# Patient Record
Sex: Male | Born: 1965
Health system: Southern US, Community
[De-identification: ages and names within clinical notes are randomized; demographics above are authoritative.]

## PROBLEM LIST (undated history)

## (undated) DIAGNOSIS — F32A Depression, unspecified: Secondary | ICD-10-CM

## (undated) DIAGNOSIS — I10 Essential (primary) hypertension: Secondary | ICD-10-CM

## (undated) DIAGNOSIS — M069 Rheumatoid arthritis, unspecified: Secondary | ICD-10-CM

## (undated) DIAGNOSIS — F329 Major depressive disorder, single episode, unspecified: Secondary | ICD-10-CM

## (undated) DIAGNOSIS — E119 Type 2 diabetes mellitus without complications: Secondary | ICD-10-CM

## (undated) DIAGNOSIS — M359 Systemic involvement of connective tissue, unspecified: Secondary | ICD-10-CM

## (undated) HISTORY — PX: TONSILLECTOMY: SUR1361

## (undated) HISTORY — PX: CERVICAL SPINE SURGERY: SHX589

---

## 2002-11-03 ENCOUNTER — Encounter: Payer: Self-pay | Admitting: Neurosurgery

## 2002-11-03 ENCOUNTER — Inpatient Hospital Stay (HOSPITAL_COMMUNITY): Admission: RE | Admit: 2002-11-03 | Discharge: 2002-11-04 | Payer: Self-pay | Admitting: Neurosurgery

## 2003-02-27 ENCOUNTER — Ambulatory Visit (HOSPITAL_COMMUNITY): Admission: RE | Admit: 2003-02-27 | Discharge: 2003-02-27 | Payer: Self-pay | Admitting: Neurosurgery

## 2003-02-27 ENCOUNTER — Encounter: Payer: Self-pay | Admitting: Neurosurgery

## 2006-02-25 ENCOUNTER — Emergency Department (HOSPITAL_COMMUNITY): Admission: EM | Admit: 2006-02-25 | Discharge: 2006-02-25 | Payer: Self-pay | Admitting: Emergency Medicine

## 2007-06-05 ENCOUNTER — Emergency Department (HOSPITAL_COMMUNITY): Admission: EM | Admit: 2007-06-05 | Discharge: 2007-06-05 | Payer: Self-pay | Admitting: Emergency Medicine

## 2010-12-13 NOTE — Op Note (Signed)
NAME:  Darrell Terry, Darrell Terry                        ACCOUNT NO.:  [id number]   MEDICAL RECORD NO.:  [id number]                   PATIENT TYPE:  INP   LOCATION:  3007                                 FACILITY:  [facility]   PHYSICIAN:  Hewitt Shorts, M.D.            DATE OF BIRTH:  1966/02/18   DATE OF PROCEDURE:  11/03/2002  DATE OF DISCHARGE:  11/04/2002                                 OPERATIVE REPORT   PREOPERATIVE DIAGNOSES:  C5-6 cervical disk herniation, spondylosis,  degenerative disk disease with stenosis.   POSTOPERATIVE DIAGNOSES:  C5-6 cervical disk herniation, spondylosis,  degenerative disk disease with stenosis.   PROCEDURE:  C5-6 anterior cervical diskectomy and arthrodesis with allograft  and cervical plating.   SURGEON:  Hewitt Shorts, M.D.   ASSISTANT:  Cristi Loron, M.D.   ANESTHESIA:  General endotracheal.   INDICATION FOR PROCEDURE:  The patient is a 45 year old man who was found to  have disk herniation, cervical spondylosis and degenerative disk disease  with relative stenosis.  A decision was made to proceed with decompression  and arthrodesis.   DESCRIPTION OF PROCEDURE:  The patient was brought to the operating room and  placed under general endotracheal anesthesia.  The patient was placed in 10  pounds of Holter traction.  The neck was prepped with Betadine soap and  solution and draped in a sterile fashion.  A horizontal incision was made in  the left side of the neck.  The line of the incision was infiltrated with  local anesthetic with epinephrine.  An incision was made, carried down  through the subcutaneous tissue.  Dissection was carried down further  through the platysma and then dissection was carried down through an  avascular plane leaving the sternocleidomastoid, carotid artery, and jugular  vein laterally and the trachea and esophagus medially.  The ventral aspect  of the vertebral column was identified and a localizing x-ray was  taken and  the C5-6 intervertebral disk space identified.  Diskectomy was begun with  incision in the annulus and continued with microcurettes and pituitary  rongeurs.  The cartilaginous end plates of the corresponding vertebrae were  removed using microcurettes along with the Micro-Max drill.  The microscope  was draped and brought into the field to provide additional magnification,  illumination, and visualization, and the remainder of the diskectomy was  performed using microdissection and microsurgical technique.  The posterior  osteophytic overgrowth was removed using the Micro-Max drill to thin the  osteophytic overgrowth and then a 2 mm Kerrison punch with a thin foot plate  to remove the last bit of bone and thickened ligament tissue.  Decompression  of the spinal canal and the thecal sac as well as the foramina and nerve  roots was performed bilaterally and once the diskectomy was completed and  the decompression completed, we proceeded with an arthrodesis.  We selected  a wedge of iliac crest allograft that  was placed in the intervertebral disk  space and counter sunk and then we selected a 24 mm Trinica cervical plate.  It was positioned over the fusion construct and secured to each of the  vertebrae with a pair of 4.2 x 14 mm screws.  Each screw hole was drilled,  tapped, and the screw placed in alternating fashion.  Once all four screws  were fully tightened, the locking system was secured.  An x-ray was taken,  which showed the graft in good position, the plate and screws in good  position, the alignment was good.  The wound was irrigated with bacitracin  solution, checked for hemostasis, which was established and confirmed, and  the wound was closed in multiple layers.  The platysma was closed with  interrupted, inverted 2-0 undyed Vicryl sutures, the subcutaneous and  subcuticular layer were closed with interrupted, inverted 3-0 undyed Vicryl  sutures, the skin edge  approximated with Dermabond.  The patient tolerated  the procedure well.  The estimated blood loss was 25 mL.  Sponge and needle  count were correct.  Following surgery, the patient was taken out of  traction and was placed in a soft collar and transferred to the recovery  room for further care.                                               Hewitt Shorts, M.D.    RWN/MEDQ  D:  11/30/2002  T:  11/30/2002  Job:  [id number]

## 2011-06-05 ENCOUNTER — Other Ambulatory Visit: Payer: Self-pay | Admitting: Neurology

## 2011-06-05 DIAGNOSIS — M542 Cervicalgia: Secondary | ICD-10-CM

## 2011-06-09 ENCOUNTER — Ambulatory Visit (HOSPITAL_COMMUNITY)
Admission: RE | Admit: 2011-06-09 | Discharge: 2011-06-09 | Disposition: A | Discharge status: Home / Self Care | Payer: Medicaid Other | Source: Ambulatory Visit | Attending: Neurology | Admitting: Neurology

## 2011-06-09 DIAGNOSIS — Z981 Arthrodesis status: Secondary | ICD-10-CM | POA: Insufficient documentation

## 2011-06-09 DIAGNOSIS — M79609 Pain in unspecified limb: Secondary | ICD-10-CM | POA: Insufficient documentation

## 2011-06-09 DIAGNOSIS — M542 Cervicalgia: Secondary | ICD-10-CM | POA: Insufficient documentation

## 2011-06-09 DIAGNOSIS — M5020 Other cervical disc displacement, unspecified cervical region: Secondary | ICD-10-CM | POA: Insufficient documentation

## 2011-08-14 DIAGNOSIS — M069 Rheumatoid arthritis, unspecified: Secondary | ICD-10-CM | POA: Diagnosis not present

## 2011-08-14 DIAGNOSIS — I10 Essential (primary) hypertension: Secondary | ICD-10-CM | POA: Diagnosis not present

## 2011-09-16 DIAGNOSIS — M069 Rheumatoid arthritis, unspecified: Secondary | ICD-10-CM | POA: Diagnosis not present

## 2011-11-19 DIAGNOSIS — M069 Rheumatoid arthritis, unspecified: Secondary | ICD-10-CM | POA: Diagnosis not present

## 2011-11-27 DIAGNOSIS — M069 Rheumatoid arthritis, unspecified: Secondary | ICD-10-CM | POA: Diagnosis not present

## 2011-11-27 DIAGNOSIS — M542 Cervicalgia: Secondary | ICD-10-CM | POA: Diagnosis not present

## 2011-11-27 DIAGNOSIS — M5380 Other specified dorsopathies, site unspecified: Secondary | ICD-10-CM | POA: Diagnosis not present

## 2011-11-27 DIAGNOSIS — Z981 Arthrodesis status: Secondary | ICD-10-CM | POA: Diagnosis not present

## 2011-12-01 DIAGNOSIS — L723 Sebaceous cyst: Secondary | ICD-10-CM | POA: Diagnosis not present

## 2011-12-01 DIAGNOSIS — B079 Viral wart, unspecified: Secondary | ICD-10-CM | POA: Diagnosis not present

## 2011-12-15 DIAGNOSIS — M47812 Spondylosis without myelopathy or radiculopathy, cervical region: Secondary | ICD-10-CM | POA: Diagnosis not present

## 2011-12-15 DIAGNOSIS — M62838 Other muscle spasm: Secondary | ICD-10-CM | POA: Diagnosis not present

## 2012-01-07 DIAGNOSIS — Z01811 Encounter for preprocedural respiratory examination: Secondary | ICD-10-CM | POA: Diagnosis not present

## 2012-01-07 DIAGNOSIS — Z01818 Encounter for other preprocedural examination: Secondary | ICD-10-CM | POA: Diagnosis not present

## 2012-01-07 DIAGNOSIS — M069 Rheumatoid arthritis, unspecified: Secondary | ICD-10-CM | POA: Diagnosis not present

## 2012-01-07 DIAGNOSIS — Z01810 Encounter for preprocedural cardiovascular examination: Secondary | ICD-10-CM | POA: Diagnosis not present

## 2012-01-14 DIAGNOSIS — F411 Generalized anxiety disorder: Secondary | ICD-10-CM | POA: Diagnosis not present

## 2012-01-14 DIAGNOSIS — Z472 Encounter for removal of internal fixation device: Secondary | ICD-10-CM | POA: Diagnosis not present

## 2012-01-14 DIAGNOSIS — Z981 Arthrodesis status: Secondary | ICD-10-CM | POA: Diagnosis not present

## 2012-01-14 DIAGNOSIS — M542 Cervicalgia: Secondary | ICD-10-CM | POA: Diagnosis not present

## 2012-01-14 DIAGNOSIS — M5030 Other cervical disc degeneration, unspecified cervical region: Secondary | ICD-10-CM | POA: Diagnosis not present

## 2012-01-14 DIAGNOSIS — R51 Headache: Secondary | ICD-10-CM | POA: Diagnosis not present

## 2012-01-14 DIAGNOSIS — M4712 Other spondylosis with myelopathy, cervical region: Secondary | ICD-10-CM | POA: Diagnosis not present

## 2012-01-14 DIAGNOSIS — M069 Rheumatoid arthritis, unspecified: Secondary | ICD-10-CM | POA: Diagnosis not present

## 2012-02-03 DIAGNOSIS — Z4789 Encounter for other orthopedic aftercare: Secondary | ICD-10-CM | POA: Diagnosis not present

## 2012-02-03 DIAGNOSIS — Z981 Arthrodesis status: Secondary | ICD-10-CM | POA: Diagnosis not present

## 2012-02-11 DIAGNOSIS — M5090 Cervical disc disorder, unspecified, unspecified cervical region: Secondary | ICD-10-CM | POA: Diagnosis not present

## 2012-02-11 DIAGNOSIS — K219 Gastro-esophageal reflux disease without esophagitis: Secondary | ICD-10-CM | POA: Diagnosis not present

## 2012-02-18 DIAGNOSIS — M069 Rheumatoid arthritis, unspecified: Secondary | ICD-10-CM | POA: Diagnosis not present

## 2012-02-18 DIAGNOSIS — Z981 Arthrodesis status: Secondary | ICD-10-CM | POA: Diagnosis not present

## 2012-02-23 DIAGNOSIS — Z981 Arthrodesis status: Secondary | ICD-10-CM | POA: Diagnosis not present

## 2012-04-20 DIAGNOSIS — Z981 Arthrodesis status: Secondary | ICD-10-CM | POA: Diagnosis not present

## 2012-04-20 DIAGNOSIS — Z4789 Encounter for other orthopedic aftercare: Secondary | ICD-10-CM | POA: Diagnosis not present

## 2012-05-12 DIAGNOSIS — Z23 Encounter for immunization: Secondary | ICD-10-CM | POA: Diagnosis not present

## 2012-05-12 DIAGNOSIS — M5090 Cervical disc disorder, unspecified, unspecified cervical region: Secondary | ICD-10-CM | POA: Diagnosis not present

## 2012-05-12 DIAGNOSIS — G542 Cervical root disorders, not elsewhere classified: Secondary | ICD-10-CM | POA: Diagnosis not present

## 2012-05-12 DIAGNOSIS — R5383 Other fatigue: Secondary | ICD-10-CM | POA: Diagnosis not present

## 2012-05-12 DIAGNOSIS — I10 Essential (primary) hypertension: Secondary | ICD-10-CM | POA: Diagnosis not present

## 2012-05-12 DIAGNOSIS — M069 Rheumatoid arthritis, unspecified: Secondary | ICD-10-CM | POA: Diagnosis not present

## 2012-05-12 DIAGNOSIS — R5381 Other malaise: Secondary | ICD-10-CM | POA: Diagnosis not present

## 2012-07-26 DIAGNOSIS — Z09 Encounter for follow-up examination after completed treatment for conditions other than malignant neoplasm: Secondary | ICD-10-CM | POA: Diagnosis not present

## 2012-07-26 DIAGNOSIS — M5380 Other specified dorsopathies, site unspecified: Secondary | ICD-10-CM | POA: Diagnosis not present

## 2012-07-26 DIAGNOSIS — Z981 Arthrodesis status: Secondary | ICD-10-CM | POA: Diagnosis not present

## 2012-07-26 DIAGNOSIS — M542 Cervicalgia: Secondary | ICD-10-CM | POA: Diagnosis not present

## 2012-08-09 DIAGNOSIS — M47812 Spondylosis without myelopathy or radiculopathy, cervical region: Secondary | ICD-10-CM | POA: Insufficient documentation

## 2012-08-11 DIAGNOSIS — M069 Rheumatoid arthritis, unspecified: Secondary | ICD-10-CM | POA: Diagnosis not present

## 2012-08-11 DIAGNOSIS — R109 Unspecified abdominal pain: Secondary | ICD-10-CM | POA: Diagnosis not present

## 2012-08-16 DIAGNOSIS — K7689 Other specified diseases of liver: Secondary | ICD-10-CM | POA: Diagnosis not present

## 2012-08-16 DIAGNOSIS — D3000 Benign neoplasm of unspecified kidney: Secondary | ICD-10-CM | POA: Diagnosis not present

## 2012-08-16 DIAGNOSIS — R109 Unspecified abdominal pain: Secondary | ICD-10-CM | POA: Diagnosis not present

## 2012-08-16 DIAGNOSIS — K824 Cholesterolosis of gallbladder: Secondary | ICD-10-CM | POA: Diagnosis not present

## 2012-08-19 DIAGNOSIS — M069 Rheumatoid arthritis, unspecified: Secondary | ICD-10-CM | POA: Diagnosis not present

## 2012-08-19 DIAGNOSIS — M5090 Cervical disc disorder, unspecified, unspecified cervical region: Secondary | ICD-10-CM | POA: Diagnosis not present

## 2012-09-01 DIAGNOSIS — M069 Rheumatoid arthritis, unspecified: Secondary | ICD-10-CM | POA: Diagnosis not present

## 2012-09-01 DIAGNOSIS — Z79899 Other long term (current) drug therapy: Secondary | ICD-10-CM | POA: Diagnosis not present

## 2012-09-10 DIAGNOSIS — M069 Rheumatoid arthritis, unspecified: Secondary | ICD-10-CM | POA: Diagnosis not present

## 2012-09-10 DIAGNOSIS — E291 Testicular hypofunction: Secondary | ICD-10-CM | POA: Diagnosis not present

## 2012-09-10 DIAGNOSIS — R109 Unspecified abdominal pain: Secondary | ICD-10-CM | POA: Diagnosis not present

## 2012-09-10 DIAGNOSIS — N289 Disorder of kidney and ureter, unspecified: Secondary | ICD-10-CM | POA: Diagnosis not present

## 2012-10-04 DIAGNOSIS — E291 Testicular hypofunction: Secondary | ICD-10-CM | POA: Diagnosis not present

## 2012-12-08 DIAGNOSIS — Z79899 Other long term (current) drug therapy: Secondary | ICD-10-CM | POA: Diagnosis not present

## 2012-12-08 DIAGNOSIS — M47812 Spondylosis without myelopathy or radiculopathy, cervical region: Secondary | ICD-10-CM | POA: Diagnosis not present

## 2012-12-08 DIAGNOSIS — M069 Rheumatoid arthritis, unspecified: Secondary | ICD-10-CM | POA: Diagnosis not present

## 2012-12-08 DIAGNOSIS — M1990 Unspecified osteoarthritis, unspecified site: Secondary | ICD-10-CM | POA: Diagnosis not present

## 2013-02-15 ENCOUNTER — Other Ambulatory Visit (HOSPITAL_COMMUNITY): Payer: Self-pay | Admitting: Internal Medicine

## 2013-02-15 ENCOUNTER — Ambulatory Visit (HOSPITAL_COMMUNITY)
Admission: RE | Admit: 2013-02-15 | Discharge: 2013-02-15 | Disposition: A | Discharge status: Home / Self Care | Payer: Medicare Other | Source: Ambulatory Visit | Attending: Internal Medicine | Admitting: Internal Medicine

## 2013-02-15 DIAGNOSIS — R7309 Other abnormal glucose: Secondary | ICD-10-CM | POA: Diagnosis not present

## 2013-02-15 DIAGNOSIS — M5090 Cervical disc disorder, unspecified, unspecified cervical region: Secondary | ICD-10-CM | POA: Diagnosis not present

## 2013-02-15 DIAGNOSIS — J40 Bronchitis, not specified as acute or chronic: Secondary | ICD-10-CM

## 2013-02-15 DIAGNOSIS — M069 Rheumatoid arthritis, unspecified: Secondary | ICD-10-CM | POA: Diagnosis not present

## 2013-02-15 DIAGNOSIS — E782 Mixed hyperlipidemia: Secondary | ICD-10-CM | POA: Diagnosis not present

## 2013-02-15 DIAGNOSIS — Z0000 Encounter for general adult medical examination without abnormal findings: Secondary | ICD-10-CM | POA: Diagnosis not present

## 2013-02-15 DIAGNOSIS — M542 Cervicalgia: Secondary | ICD-10-CM | POA: Diagnosis not present

## 2013-02-15 DIAGNOSIS — R51 Headache: Secondary | ICD-10-CM | POA: Diagnosis not present

## 2013-02-15 DIAGNOSIS — R799 Abnormal finding of blood chemistry, unspecified: Secondary | ICD-10-CM | POA: Diagnosis not present

## 2013-02-15 DIAGNOSIS — Z981 Arthrodesis status: Secondary | ICD-10-CM | POA: Diagnosis not present

## 2013-02-15 DIAGNOSIS — I10 Essential (primary) hypertension: Secondary | ICD-10-CM | POA: Diagnosis not present

## 2013-03-03 DIAGNOSIS — M531 Cervicobrachial syndrome: Secondary | ICD-10-CM | POA: Diagnosis not present

## 2013-03-03 DIAGNOSIS — G500 Trigeminal neuralgia: Secondary | ICD-10-CM | POA: Diagnosis not present

## 2013-03-03 DIAGNOSIS — M47812 Spondylosis without myelopathy or radiculopathy, cervical region: Secondary | ICD-10-CM | POA: Diagnosis not present

## 2013-03-03 DIAGNOSIS — Z981 Arthrodesis status: Secondary | ICD-10-CM | POA: Diagnosis not present

## 2013-03-03 DIAGNOSIS — M5481 Occipital neuralgia: Secondary | ICD-10-CM | POA: Insufficient documentation

## 2013-03-03 DIAGNOSIS — R51 Headache: Secondary | ICD-10-CM | POA: Diagnosis not present

## 2013-03-03 DIAGNOSIS — M069 Rheumatoid arthritis, unspecified: Secondary | ICD-10-CM | POA: Diagnosis not present

## 2013-03-14 ENCOUNTER — Other Ambulatory Visit (HOSPITAL_COMMUNITY): Payer: Self-pay | Admitting: *Deleted

## 2013-03-14 DIAGNOSIS — G500 Trigeminal neuralgia: Secondary | ICD-10-CM

## 2013-03-16 ENCOUNTER — Ambulatory Visit (HOSPITAL_COMMUNITY)
Admission: RE | Admit: 2013-03-16 | Discharge: 2013-03-16 | Disposition: A | Discharge status: Home / Self Care | Payer: Medicare Other | Source: Ambulatory Visit | Attending: Family Medicine | Admitting: Family Medicine

## 2013-03-16 DIAGNOSIS — R209 Unspecified disturbances of skin sensation: Secondary | ICD-10-CM | POA: Diagnosis not present

## 2013-03-16 DIAGNOSIS — G500 Trigeminal neuralgia: Secondary | ICD-10-CM | POA: Diagnosis not present

## 2013-03-16 DIAGNOSIS — R51 Headache: Secondary | ICD-10-CM | POA: Diagnosis not present

## 2013-03-16 MED ORDER — GADOBENATE DIMEGLUMINE 529 MG/ML IV SOLN
20.0000 mL | Freq: Once | INTRAVENOUS | Status: AC | PRN
  Administered 2013-03-16: 20 mL via INTRAVENOUS

## 2013-03-23 DIAGNOSIS — Z79899 Other long term (current) drug therapy: Secondary | ICD-10-CM | POA: Diagnosis not present

## 2013-03-23 DIAGNOSIS — M069 Rheumatoid arthritis, unspecified: Secondary | ICD-10-CM | POA: Diagnosis not present

## 2013-04-14 DIAGNOSIS — R51 Headache: Secondary | ICD-10-CM | POA: Diagnosis not present

## 2013-04-14 DIAGNOSIS — R209 Unspecified disturbances of skin sensation: Secondary | ICD-10-CM | POA: Diagnosis not present

## 2013-04-14 DIAGNOSIS — M531 Cervicobrachial syndrome: Secondary | ICD-10-CM | POA: Diagnosis not present

## 2013-05-11 DIAGNOSIS — M069 Rheumatoid arthritis, unspecified: Secondary | ICD-10-CM | POA: Diagnosis not present

## 2013-05-11 DIAGNOSIS — Z79899 Other long term (current) drug therapy: Secondary | ICD-10-CM | POA: Diagnosis not present

## 2013-05-17 DIAGNOSIS — E7800 Pure hypercholesterolemia, unspecified: Secondary | ICD-10-CM | POA: Diagnosis not present

## 2013-05-17 DIAGNOSIS — K219 Gastro-esophageal reflux disease without esophagitis: Secondary | ICD-10-CM | POA: Diagnosis not present

## 2013-05-17 DIAGNOSIS — M069 Rheumatoid arthritis, unspecified: Secondary | ICD-10-CM | POA: Diagnosis not present

## 2013-05-17 DIAGNOSIS — Z23 Encounter for immunization: Secondary | ICD-10-CM | POA: Diagnosis not present

## 2013-06-20 DIAGNOSIS — N289 Disorder of kidney and ureter, unspecified: Secondary | ICD-10-CM | POA: Diagnosis not present

## 2013-06-20 DIAGNOSIS — E291 Testicular hypofunction: Secondary | ICD-10-CM | POA: Diagnosis not present

## 2013-06-20 DIAGNOSIS — N529 Male erectile dysfunction, unspecified: Secondary | ICD-10-CM | POA: Diagnosis not present

## 2013-06-21 DIAGNOSIS — N529 Male erectile dysfunction, unspecified: Secondary | ICD-10-CM | POA: Insufficient documentation

## 2013-08-17 DIAGNOSIS — M069 Rheumatoid arthritis, unspecified: Secondary | ICD-10-CM | POA: Diagnosis not present

## 2013-08-17 DIAGNOSIS — M47812 Spondylosis without myelopathy or radiculopathy, cervical region: Secondary | ICD-10-CM | POA: Diagnosis not present

## 2013-11-23 DIAGNOSIS — Z79899 Other long term (current) drug therapy: Secondary | ICD-10-CM | POA: Diagnosis not present

## 2013-11-23 DIAGNOSIS — M069 Rheumatoid arthritis, unspecified: Secondary | ICD-10-CM | POA: Diagnosis not present

## 2014-03-16 DIAGNOSIS — M069 Rheumatoid arthritis, unspecified: Secondary | ICD-10-CM | POA: Diagnosis not present

## 2014-03-16 DIAGNOSIS — Z79899 Other long term (current) drug therapy: Secondary | ICD-10-CM | POA: Diagnosis not present

## 2014-06-08 DIAGNOSIS — R51 Headache: Secondary | ICD-10-CM | POA: Diagnosis not present

## 2014-06-08 DIAGNOSIS — M79601 Pain in right arm: Secondary | ICD-10-CM | POA: Diagnosis not present

## 2014-06-08 DIAGNOSIS — M542 Cervicalgia: Secondary | ICD-10-CM | POA: Diagnosis not present

## 2014-06-08 DIAGNOSIS — F17210 Nicotine dependence, cigarettes, uncomplicated: Secondary | ICD-10-CM | POA: Diagnosis not present

## 2014-06-08 DIAGNOSIS — R202 Paresthesia of skin: Secondary | ICD-10-CM | POA: Diagnosis not present

## 2014-06-08 DIAGNOSIS — M47812 Spondylosis without myelopathy or radiculopathy, cervical region: Secondary | ICD-10-CM | POA: Diagnosis not present

## 2014-06-08 DIAGNOSIS — Z981 Arthrodesis status: Secondary | ICD-10-CM | POA: Diagnosis not present

## 2014-06-16 DIAGNOSIS — N2889 Other specified disorders of kidney and ureter: Secondary | ICD-10-CM | POA: Diagnosis not present

## 2014-07-12 DIAGNOSIS — Z79899 Other long term (current) drug therapy: Secondary | ICD-10-CM | POA: Diagnosis not present

## 2014-07-12 DIAGNOSIS — M0589 Other rheumatoid arthritis with rheumatoid factor of multiple sites: Secondary | ICD-10-CM | POA: Diagnosis not present

## 2014-07-12 DIAGNOSIS — Z981 Arthrodesis status: Secondary | ICD-10-CM | POA: Diagnosis not present

## 2014-07-12 DIAGNOSIS — Z13820 Encounter for screening for osteoporosis: Secondary | ICD-10-CM | POA: Diagnosis not present

## 2014-07-12 DIAGNOSIS — Z5181 Encounter for therapeutic drug level monitoring: Secondary | ICD-10-CM | POA: Diagnosis not present

## 2014-07-12 DIAGNOSIS — F17200 Nicotine dependence, unspecified, uncomplicated: Secondary | ICD-10-CM | POA: Diagnosis not present

## 2014-07-12 DIAGNOSIS — R918 Other nonspecific abnormal finding of lung field: Secondary | ICD-10-CM | POA: Diagnosis not present

## 2014-07-12 DIAGNOSIS — Z23 Encounter for immunization: Secondary | ICD-10-CM | POA: Diagnosis not present

## 2014-07-12 DIAGNOSIS — M0689 Other specified rheumatoid arthritis, multiple sites: Secondary | ICD-10-CM | POA: Diagnosis not present

## 2014-10-17 DIAGNOSIS — R030 Elevated blood-pressure reading, without diagnosis of hypertension: Secondary | ICD-10-CM | POA: Diagnosis not present

## 2014-10-17 DIAGNOSIS — F17210 Nicotine dependence, cigarettes, uncomplicated: Secondary | ICD-10-CM | POA: Diagnosis not present

## 2014-10-17 DIAGNOSIS — M069 Rheumatoid arthritis, unspecified: Secondary | ICD-10-CM | POA: Diagnosis not present

## 2014-10-17 DIAGNOSIS — Z79899 Other long term (current) drug therapy: Secondary | ICD-10-CM | POA: Diagnosis not present

## 2014-10-17 DIAGNOSIS — M65842 Other synovitis and tenosynovitis, left hand: Secondary | ICD-10-CM | POA: Diagnosis not present

## 2014-10-17 DIAGNOSIS — M0589 Other rheumatoid arthritis with rheumatoid factor of multiple sites: Secondary | ICD-10-CM | POA: Diagnosis not present

## 2014-10-17 DIAGNOSIS — Z7982 Long term (current) use of aspirin: Secondary | ICD-10-CM | POA: Diagnosis not present

## 2014-10-17 DIAGNOSIS — M65841 Other synovitis and tenosynovitis, right hand: Secondary | ICD-10-CM | POA: Diagnosis not present

## 2014-10-17 DIAGNOSIS — Z5181 Encounter for therapeutic drug level monitoring: Secondary | ICD-10-CM | POA: Diagnosis not present

## 2014-10-17 DIAGNOSIS — M0579 Rheumatoid arthritis with rheumatoid factor of multiple sites without organ or systems involvement: Secondary | ICD-10-CM | POA: Insufficient documentation

## 2014-10-17 DIAGNOSIS — Z7952 Long term (current) use of systemic steroids: Secondary | ICD-10-CM | POA: Diagnosis not present

## 2015-01-17 DIAGNOSIS — F17210 Nicotine dependence, cigarettes, uncomplicated: Secondary | ICD-10-CM | POA: Diagnosis not present

## 2015-01-17 DIAGNOSIS — M0589 Other rheumatoid arthritis with rheumatoid factor of multiple sites: Secondary | ICD-10-CM | POA: Diagnosis not present

## 2015-01-17 DIAGNOSIS — Z79899 Other long term (current) drug therapy: Secondary | ICD-10-CM | POA: Diagnosis not present

## 2015-01-17 DIAGNOSIS — E559 Vitamin D deficiency, unspecified: Secondary | ICD-10-CM | POA: Diagnosis not present

## 2015-02-15 DIAGNOSIS — M5412 Radiculopathy, cervical region: Secondary | ICD-10-CM | POA: Diagnosis not present

## 2015-02-15 DIAGNOSIS — M5030 Other cervical disc degeneration, unspecified cervical region: Secondary | ICD-10-CM | POA: Diagnosis not present

## 2015-02-15 DIAGNOSIS — F17200 Nicotine dependence, unspecified, uncomplicated: Secondary | ICD-10-CM | POA: Diagnosis not present

## 2015-02-15 DIAGNOSIS — M069 Rheumatoid arthritis, unspecified: Secondary | ICD-10-CM | POA: Diagnosis not present

## 2015-02-15 DIAGNOSIS — R7309 Other abnormal glucose: Secondary | ICD-10-CM | POA: Diagnosis not present

## 2015-02-15 DIAGNOSIS — I10 Essential (primary) hypertension: Secondary | ICD-10-CM | POA: Diagnosis not present

## 2015-02-15 DIAGNOSIS — E785 Hyperlipidemia, unspecified: Secondary | ICD-10-CM | POA: Diagnosis not present

## 2015-02-15 DIAGNOSIS — E780 Pure hypercholesterolemia: Secondary | ICD-10-CM | POA: Diagnosis not present

## 2015-02-15 DIAGNOSIS — R945 Abnormal results of liver function studies: Secondary | ICD-10-CM | POA: Diagnosis not present

## 2015-03-06 ENCOUNTER — Telehealth: Payer: Self-pay

## 2015-03-14 DIAGNOSIS — F17200 Nicotine dependence, unspecified, uncomplicated: Secondary | ICD-10-CM | POA: Diagnosis not present

## 2015-03-14 DIAGNOSIS — M069 Rheumatoid arthritis, unspecified: Secondary | ICD-10-CM | POA: Diagnosis not present

## 2015-03-14 DIAGNOSIS — E119 Type 2 diabetes mellitus without complications: Secondary | ICD-10-CM | POA: Diagnosis not present

## 2015-03-14 DIAGNOSIS — F17290 Nicotine dependence, other tobacco product, uncomplicated: Secondary | ICD-10-CM | POA: Diagnosis not present

## 2015-03-14 DIAGNOSIS — E1165 Type 2 diabetes mellitus with hyperglycemia: Secondary | ICD-10-CM | POA: Diagnosis not present

## 2015-03-14 DIAGNOSIS — M5412 Radiculopathy, cervical region: Secondary | ICD-10-CM | POA: Diagnosis not present

## 2015-03-16 ENCOUNTER — Other Ambulatory Visit: Payer: Self-pay

## 2015-03-16 DIAGNOSIS — Z1211 Encounter for screening for malignant neoplasm of colon: Secondary | ICD-10-CM

## 2015-03-16 NOTE — Telephone Encounter (Signed)
Gastroenterology Pre-Procedure Review  Request Date: 02/27/2015 Requesting Physician:   PATIENT REVIEW QUESTIONS: The patient responded to the following health history questions as indicated:    1. Diabetes Melitis: no 2. Joint replacements in the past 12 months: no 3. Major health problems in the past 3 months: no 4. Has an artificial valve or MVP: no 5. Has a defibrillator: no 6. Has been advised in past to take antibiotics in advance of a procedure like teeth cleaning: no    MEDICATIONS & ALLERGIES:    Patient reports the following regarding taking any blood thinners:   Plavix? no Aspirin? no Coumadin? no  Patient confirms/reports the following medications:  Current Outpatient Prescriptions  Medication Sig Dispense Refill  . folic acid (FOLVITE) 124 MCG tablet Take 400 mcg by mouth 2 (two) times daily.    . methotrexate 2.5 MG tablet Take 2.5 mg by mouth once a week. Pt takes 6 tablets once a week    . Omega-3 Fatty Acids (FISH OIL) 1000 MG CAPS Take 1,000 mg by mouth daily.    Marland Kitchen omeprazole (PRILOSEC) 20 MG capsule Take 20 mg by mouth daily.     No current facility-administered medications for this visit.    Patient confirms/reports the following allergies:  No Known Allergies  No orders of the defined types were placed in this encounter.    AUTHORIZATION INFORMATION Primary Insurance:   ID #:  Group #:  Pre-Cert / Auth required:  Pre-Cert / Auth #:   Secondary Insurance:   ID #:  Group #:  Pre-Cert / Auth required: Pre-Cert / Auth #:   SCHEDULE INFORMATION: Procedure has been scheduled as follows:  Date:  03/26/2015                 Time:  7:30 Am Location: [facility] Short Stay  This Gastroenterology Pre-Precedure Review Form is being routed to the following provider(s): R. Garfield Cornea, MD

## 2015-03-16 NOTE — Telephone Encounter (Signed)
Appropriate.

## 2015-03-20 MED ORDER — PEG 3350-KCL-NA BICARB-NACL 420 G PO SOLR: 4000.0000 mL | ORAL | Status: DC

## 2015-03-20 NOTE — Telephone Encounter (Signed)
LMOM to call in reference to the time change of the procedure. Rx sent to [facility] and instructions faxed to the pharmacy also.

## 2015-03-21 NOTE — Telephone Encounter (Signed)
Pt is aware and will check today and let me know if there are any problems since he is scheduled for Mon.

## 2015-03-26 ENCOUNTER — Encounter (HOSPITAL_COMMUNITY): Payer: Self-pay | Admitting: *Deleted

## 2015-03-26 ENCOUNTER — Encounter (HOSPITAL_COMMUNITY)
Admission: RE | Disposition: A | Discharge status: Home / Self Care | Payer: Self-pay | Source: Ambulatory Visit | Attending: Internal Medicine

## 2015-03-26 ENCOUNTER — Ambulatory Visit (HOSPITAL_COMMUNITY)
Admission: RE | Admit: 2015-03-26 | Discharge: 2015-03-26 | Disposition: A | Discharge status: Home / Self Care | Payer: Medicare Other | Source: Ambulatory Visit | Attending: Internal Medicine | Admitting: Internal Medicine

## 2015-03-26 DIAGNOSIS — Z79899 Other long term (current) drug therapy: Secondary | ICD-10-CM | POA: Diagnosis not present

## 2015-03-26 DIAGNOSIS — D129 Benign neoplasm of anus and anal canal: Secondary | ICD-10-CM | POA: Diagnosis not present

## 2015-03-26 DIAGNOSIS — E119 Type 2 diabetes mellitus without complications: Secondary | ICD-10-CM | POA: Insufficient documentation

## 2015-03-26 DIAGNOSIS — M069 Rheumatoid arthritis, unspecified: Secondary | ICD-10-CM | POA: Insufficient documentation

## 2015-03-26 DIAGNOSIS — Z1211 Encounter for screening for malignant neoplasm of colon: Secondary | ICD-10-CM | POA: Diagnosis not present

## 2015-03-26 DIAGNOSIS — K644 Residual hemorrhoidal skin tags: Secondary | ICD-10-CM | POA: Diagnosis not present

## 2015-03-26 DIAGNOSIS — K621 Rectal polyp: Secondary | ICD-10-CM | POA: Insufficient documentation

## 2015-03-26 HISTORY — DX: Depression, unspecified: F32.A

## 2015-03-26 HISTORY — PX: COLONOSCOPY: SHX5424

## 2015-03-26 HISTORY — DX: Rheumatoid arthritis, unspecified: M06.9

## 2015-03-26 HISTORY — DX: Type 2 diabetes mellitus without complications: E11.9

## 2015-03-26 HISTORY — DX: Major depressive disorder, single episode, unspecified: F32.9

## 2015-03-26 LAB — GLUCOSE, CAPILLARY: GLUCOSE-CAPILLARY: 159 mg/dL — AB (ref 65–99)

## 2015-03-26 SURGERY — COLONOSCOPY
Anesthesia: Moderate Sedation

## 2015-03-26 MED ORDER — SODIUM CHLORIDE 0.9 % IV SOLN
INTRAVENOUS | Status: DC
  Administered 2015-03-26: 08:00:00 via INTRAVENOUS

## 2015-03-26 MED ORDER — STERILE WATER FOR IRRIGATION IR SOLN
Status: DC | PRN
  Administered 2015-03-26: 08:00:00

## 2015-03-26 MED ORDER — MIDAZOLAM HCL 5 MG/5ML IJ SOLN
INTRAMUSCULAR | Status: DC | PRN
  Administered 2015-03-26: 1 mg via INTRAVENOUS
  Administered 2015-03-26 (×2): 2 mg via INTRAVENOUS
  Administered 2015-03-26: 1 mg via INTRAVENOUS

## 2015-03-26 MED ORDER — PROMETHAZINE HCL 25 MG/ML IJ SOLN
INTRAMUSCULAR | Status: AC
  Filled 2015-03-26: qty 1

## 2015-03-26 MED ORDER — PROMETHAZINE HCL 25 MG/ML IJ SOLN
25.0000 mg | Freq: Once | INTRAMUSCULAR | Status: AC
  Administered 2015-03-26: 25 mg via INTRAVENOUS

## 2015-03-26 MED ORDER — SODIUM CHLORIDE 0.9 % IJ SOLN
INTRAMUSCULAR | Status: AC
  Filled 2015-03-26: qty 3

## 2015-03-26 MED ORDER — ONDANSETRON HCL 4 MG/2ML IJ SOLN
INTRAMUSCULAR | Status: DC | PRN
  Administered 2015-03-26: 4 mg via INTRAVENOUS

## 2015-03-26 MED ORDER — MIDAZOLAM HCL 5 MG/5ML IJ SOLN
INTRAMUSCULAR | Status: AC
  Filled 2015-03-26: qty 10

## 2015-03-26 MED ORDER — ONDANSETRON HCL 4 MG/2ML IJ SOLN
INTRAMUSCULAR | Status: AC
  Filled 2015-03-26: qty 2

## 2015-03-26 MED ORDER — MEPERIDINE HCL 100 MG/ML IJ SOLN
INTRAMUSCULAR | Status: DC | PRN
  Administered 2015-03-26 (×2): 25 mg via INTRAVENOUS
  Administered 2015-03-26: 50 mg via INTRAVENOUS

## 2015-03-26 MED ORDER — MEPERIDINE HCL 100 MG/ML IJ SOLN
INTRAMUSCULAR | Status: DC
Start: 2015-03-26 — End: 2015-03-26
  Filled 2015-03-26: qty 2

## 2015-03-26 NOTE — Op Note (Signed)
[facility] [address] [place], [address]   COLONOSCOPY PROCEDURE REPORT  PATIENT: Darrell, Terry  MR#: [id number] BIRTHDATE: [birth year] , 48  yrs. old GENDER: male ENDOSCOPIST: R.  Garfield Cornea, MD FACP [facility] REFERRED RK:[id number] Legrand Rams, M.D. PROCEDURE DATE:  03-29-15 PROCEDURE:   Colonoscopy with biopsy INDICATIONS:First-ever average risk colorectal cancer screening examination. MEDICATIONS: Versed 6 mg IV and Demerol 100 mg IV in divided doses. Phenergan 25 mg IV.  Zofran 4 mg IV. ASA CLASS:       Class II  CONSENT: The risks, benefits, alternatives and imponderables including but not limited to bleeding, perforation as well as the possibility of a missed lesion have been reviewed.  The potential for biopsy, lesion removal, etc. have also been discussed. Questions have been answered.  All parties agreeable.  Please see the history and physical in the medical record for more information.  DESCRIPTION OF PROCEDURE:   After the risks benefits and alternatives of the procedure were thoroughly explained, informed consent was obtained.  The digital rectal exam revealed external hemorrhoids.   The EC-[id number] ([id number])  endoscope was introduced through the anus and advanced to the cecum, which was identified by both the appendix and ileocecal valve. No adverse events experienced.   The quality of the prep was adequate  The instrument was then slowly withdrawn as the colon was fully examined. Estimated blood loss is zero unless otherwise noted in this procedure report.      COLON FINDINGS: External hemorrhoids present.  (1) diminutive polyp just proximal to the anal verge; otherwise, the remainder of the rectal mucosa appeared normal.  The colonic mucosa appeared normal. Retroflexion was performed. .  The above-mentioned problems: Biopsy/removed.  Withdrawal time=12 minutes 0 seconds.  The scope was withdrawn and the procedure  completed. COMPLICATIONS: There were no immediate complications. EBL 5 mL ENDOSCOPIC IMPRESSION: External hemorrhoids. Single diminutive rectal polyp?"removed as described above; otherwise normal colonoscopy  RECOMMENDATIONS: Follow up on pathology.  eSigned:  R. Garfield Cornea, MD Rosalita Chessman [facility] 29-Mar-2015 8:37 AM   cc:  CPT CODES: ICD CODES:  The ICD and CPT codes recommended by this software are interpretations from the data that the clinical staff has captured with the software.  The verification of the translation of this report to the ICD and CPT codes and modifiers is the sole responsibility of the health care institution and practicing physician where this report was generated.  [facility]. will not be held responsible for the validity of the ICD and CPT codes included on this report.  AMA assumes no liability for data contained or not contained herein. CPT is a Designer, television/film set of the [employer].

## 2015-03-26 NOTE — Discharge Instructions (Signed)
°  Colonoscopy Discharge Instructions  Read the instructions outlined below and refer to this sheet in the next few weeks. These discharge instructions provide you with general information on caring for yourself after you leave the hospital. Your doctor may also give you specific instructions. While your treatment has been planned according to the most current medical practices available, unavoidable complications occasionally occur. If you have any problems or questions after discharge, call Dr. Gala Romney at [phone]. ACTIVITY  You may resume your regular activity, but move at a slower pace for the next 24 hours.   Take frequent rest periods for the next 24 hours.   Walking will help get rid of the air and reduce the bloated feeling in your belly (abdomen).   No driving for 24 hours (because of the medicine (anesthesia) used during the test).    Do not sign any important legal documents or operate any machinery for 24 hours (because of the anesthesia used during the test).  NUTRITION  Drink plenty of fluids.   You may resume your normal diet as instructed by your doctor.   Begin with a light meal and progress to your normal diet. Heavy or fried foods are harder to digest and may make you feel sick to your stomach (nauseated).   Avoid alcoholic beverages for 24 hours or as instructed.  MEDICATIONS  You may resume your normal medications unless your doctor tells you otherwise.  WHAT YOU CAN EXPECT TODAY  Some feelings of bloating in the abdomen.   Passage of more gas than usual.   Spotting of blood in your stool or on the toilet paper.  IF YOU HAD POLYPS REMOVED DURING THE COLONOSCOPY:  No aspirin products for 7 days or as instructed.   No alcohol for 7 days or as instructed.   Eat a soft diet for the next 24 hours.  FINDING OUT THE RESULTS OF YOUR TEST Not all test results are available during your visit. If your test results are not back during the visit, make an appointment  with your caregiver to find out the results. Do not assume everything is normal if you have not heard from your caregiver or the medical facility. It is important for you to follow up on all of your test results.  SEEK IMMEDIATE MEDICAL ATTENTION IF:  You have more than a spotting of blood in your stool.   Your belly is swollen (abdominal distention).   You are nauseated or vomiting.   You have a temperature over 101.   You have abdominal pain or discomfort that is severe or gets worse throughout the day.    Follow-up information provided  Further recommendations to follow pending review of pathology report  You had a small rectal polyp removed. You may pass a small amount of blood with your next bowel movement or 2 but this should resolve

## 2015-03-26 NOTE — H&P (Signed)
@LOGO @   Primary Care Physician:  Rosita Fire, MD Primary Gastroenterologist:  Dr. Gala Romney  Pre-Procedure History & Physical: HPI:  Darrell Terry is a 49 y.o. male is here for a screening colonoscopy.  No bowel symptoms. No family history of colon cancer. No prior colonoscopy.  Past Medical History  Diagnosis Date  . Rheumatoid arthritis   . Diabetes mellitus without complication   . Depression     Past Surgical History  Procedure Laterality Date  . Tonsillectomy    . Cervical spine surgery      X 3    Prior to Admission medications   Medication Sig Start Date End Date Taking? Authorizing Provider  folic acid (FOLVITE) 151 MCG tablet Take 800 mcg by mouth 2 (two) times daily.    Yes Historical Provider, MD  methotrexate 2.5 MG tablet Take 15 mg by mouth once a week. saturday   Yes Historical Provider, MD  Omega-3 Fatty Acids (FISH OIL) 1000 MG CAPS Take 1,000 mg by mouth daily.   Yes Historical Provider, MD  omeprazole (PRILOSEC) 20 MG capsule Take 20 mg by mouth daily.   Yes Historical Provider, MD  polyethylene glycol-electrolytes (TRILYTE) 420 G solution Take 4,000 mLs by mouth as directed. 03/20/15  Yes Daneil Dolin, MD    Allergies as of 03/16/2015  . (No Known Allergies)    History reviewed. No pertinent family history.  Social History   Social History  . Marital Status: Single    Spouse Name: N/A  . Number of Children: N/A  . Years of Education: N/A   Occupational History  . Not on file.   Social History Main Topics  . Smoking status: Current Every Day Smoker -- 1.00 packs/day for 34 years    Types: Cigarettes  . Smokeless tobacco: Not on file  . Alcohol Use: No  . Drug Use: Yes    Special: Marijuana     Comment: 1-2 times/day  . Sexual Activity: Not on file   Other Topics Concern  . Not on file   Social History Narrative  . No narrative on file    Review of Systems: See HPI, otherwise negative ROS  Physical Exam: BP 142/83 mmHg   Pulse 74  Temp(Src) 98.3 F (36.8 C) (Oral)  Resp 18  Ht 6' (1.829 m)  Wt 225 lb (102.059 kg)  BMI 30.51 kg/m2  SpO2 98% General:   Alert,  Well-developed, well-nourished, pleasant and cooperative in NAD Head:  Normocephalic and atraumatic. Eyes:  Sclera clear, no icterus.   Conjunctiva pink. Ears:  Normal auditory acuity. Nose:  No deformity, discharge,  or lesions. Mouth:  No deformity or lesions, dentition normal. Neck:  Supple; no masses or thyromegaly. Lungs:  Clear throughout to auscultation.   No wheezes, crackles, or rhonchi. No acute distress. Heart:  Regular rate and rhythm; no murmurs, clicks, rubs,  or gallops. Abdomen:  Soft, nontender and nondistended. No masses, hepatosplenomegaly or hernias noted. Normal bowel sounds, without guarding, and without rebound.   Msk:  Symmetrical without gross deformities. Normal posture. Pulses:  Normal pulses noted. Extremities:  Without clubbing or edema.  Impression/Plan: Darrell Terry is now here to undergo a screening colonoscopy.  First-ever average risk screening examination  Risks, benefits, limitations, imponderables and alternatives regarding colonoscopy have been reviewed with the patient. Questions have been answered. All parties agreeable.  Will augment conscious sedation with Phenergan 25 mg IV prior to the procedure.    Notice:  This dictation was prepared with  Dragon dictation along with smaller Company secretary. Any transcriptional errors that result from this process are unintentional and may not be corrected upon review.

## 2015-03-27 ENCOUNTER — Encounter: Payer: Self-pay | Admitting: Internal Medicine

## 2015-03-28 ENCOUNTER — Encounter (HOSPITAL_COMMUNITY): Payer: Self-pay | Admitting: Internal Medicine

## 2015-04-25 ENCOUNTER — Encounter (HOSPITAL_COMMUNITY): Payer: Self-pay

## 2015-04-25 ENCOUNTER — Emergency Department (HOSPITAL_COMMUNITY)
Admission: EM | Admit: 2015-04-25 | Discharge: 2015-04-25 | Disposition: A | Discharge status: Home / Self Care | Payer: Medicare Other | Attending: Emergency Medicine | Admitting: Emergency Medicine

## 2015-04-25 ENCOUNTER — Emergency Department (HOSPITAL_COMMUNITY): Payer: Medicare Other

## 2015-04-25 DIAGNOSIS — E119 Type 2 diabetes mellitus without complications: Secondary | ICD-10-CM | POA: Insufficient documentation

## 2015-04-25 DIAGNOSIS — Z720 Tobacco use: Secondary | ICD-10-CM | POA: Insufficient documentation

## 2015-04-25 DIAGNOSIS — Z79899 Other long term (current) drug therapy: Secondary | ICD-10-CM | POA: Insufficient documentation

## 2015-04-25 DIAGNOSIS — S93402A Sprain of unspecified ligament of left ankle, initial encounter: Secondary | ICD-10-CM | POA: Insufficient documentation

## 2015-04-25 DIAGNOSIS — Y9389 Activity, other specified: Secondary | ICD-10-CM | POA: Insufficient documentation

## 2015-04-25 DIAGNOSIS — Z8659 Personal history of other mental and behavioral disorders: Secondary | ICD-10-CM | POA: Insufficient documentation

## 2015-04-25 DIAGNOSIS — Y998 Other external cause status: Secondary | ICD-10-CM | POA: Diagnosis not present

## 2015-04-25 DIAGNOSIS — X58XXXA Exposure to other specified factors, initial encounter: Secondary | ICD-10-CM | POA: Diagnosis not present

## 2015-04-25 DIAGNOSIS — M069 Rheumatoid arthritis, unspecified: Secondary | ICD-10-CM | POA: Diagnosis not present

## 2015-04-25 DIAGNOSIS — Y92096 Garden or yard of other non-institutional residence as the place of occurrence of the external cause: Secondary | ICD-10-CM | POA: Diagnosis not present

## 2015-04-25 DIAGNOSIS — S99912A Unspecified injury of left ankle, initial encounter: Secondary | ICD-10-CM | POA: Diagnosis present

## 2015-04-25 MED ORDER — NAPROXEN 500 MG PO TABS
500.0000 mg | ORAL_TABLET | Freq: Two times a day (BID) | ORAL | Status: DC
Start: 2015-04-25 — End: 2016-01-10

## 2015-04-25 MED ORDER — IBUPROFEN 400 MG PO TABS
600.0000 mg | ORAL_TABLET | Freq: Once | ORAL | Status: AC
  Administered 2015-04-25: 600 mg via ORAL
  Filled 2015-04-25: qty 2

## 2015-04-25 MED ORDER — HYDROCODONE-ACETAMINOPHEN 5-325 MG PO TABS
2.0000 | ORAL_TABLET | Freq: Once | ORAL | Status: AC
  Administered 2015-04-25: 2 via ORAL
  Filled 2015-04-25: qty 2

## 2015-04-25 NOTE — ED Notes (Signed)
Pt reports pain to left ankle after "twisting" it Monday afternoon while moving a treadmill to the road. Pt has been unable to ambulate well, pt does report if he positions weight on left ankle then ambulation is slightly tolerable. Pt reports he heard something "pop" when he "twisted" the left ankle. Left ankle is obviously swollen and red, very tender to lateral aspect of left ankle. Pedal pulses WNL. Capillary refill less than 3 seconds in left toes.

## 2015-04-25 NOTE — Discharge Instructions (Signed)
If you were given medicines take as directed.  If you are on coumadin or contraceptives realize their levels and effectiveness is altered by many different medicines.  If you have any reaction (rash, tongues swelling, other) to the medicines stop taking and see a physician.    If your blood pressure was elevated in the ER make sure you follow up for management with a primary doctor or return for chest pain, shortness of breath or stroke symptoms.  Please follow up as directed and return to the ER or see a physician for new or worsening symptoms.  Thank you. Filed Vitals:   04/25/15 0945  BP: 154/97  Pulse: 87  Temp: 98.2 F (36.8 C)  TempSrc: Oral  Resp: 18  SpO2: 95%    Ankle Sprain An ankle sprain is an injury to the strong, fibrous tissues (ligaments) that hold your ankle bones together.  HOME CARE   Put ice on your ankle for 1-2 days or as told by your doctor.  Put ice in a plastic bag.  Place a towel between your skin and the bag.  Leave the ice on for 15-20 minutes at a time, every 2 hours while you are awake.  Only take medicine as told by your doctor.  Raise (elevate) your injured ankle above the level of your heart as much as possible for 2-3 days.  Use crutches if your doctor tells you to. Slowly put your own weight on the affected ankle. Use the crutches until you can walk without pain.  If you have a plaster splint:  Do not rest it on anything harder than a pillow for 24 hours.  Do not put weight on it.  Do not get it wet.  Take it off to shower or bathe.  If given, use an elastic wrap or support stocking for support. Take the wrap off if your toes lose feeling (numb), tingle, or turn cold or blue.  If you have an air splint:  Add or let out air to make it comfortable.  Take it off at night and to shower and bathe.  Wiggle your toes and move your ankle up and down often while you are wearing it. GET HELP IF:  You have rapidly increasing bruising or  puffiness (swelling).  Your toes feel very cold.  You lose feeling in your foot.  Your medicine does not help your pain. GET HELP RIGHT AWAY IF:   Your toes lose feeling (numb) or turn blue.  You have severe pain that is increasing. MAKE SURE YOU:   Understand these instructions.  Will watch your condition.  Will get help right away if you are not doing well or get worse. Document Released: 12/31/2007 Document Revised: 11/28/2013 Document Reviewed: 01/26/2012 [facility] Patient Information 2015 [place], [place]. This information is not intended to replace advice given to you by your health care provider. Make sure you discuss any questions you have with your health care provider.

## 2015-04-25 NOTE — ED Notes (Signed)
Pt reports twisted left ankle 2 days ago.  Ankle red and swollen.  Pedal pulse present.

## 2015-04-25 NOTE — ED Provider Notes (Signed)
CSN: [id number]     Arrival date & time 04/25/15  9735 History  By signing my name below, I, Darrell Terry, attest that this documentation has been prepared under the direction and in the presence of Darrell Morrison, MD. Electronically Signed: Stephania Terry, ED Scribe. 05/04/2015. 10:39 AM.    Chief Complaint  Patient presents with  . Ankle Pain   The history is provided by the patient. No language interpreter was used.    HPI Comments: Darrell Terry is a 49 y.o. male with a history of RA, who presents to the Emergency Department complaining of sudden-onset, constant, severe, worsening left ankle pain, swelling, and redness after twisting it while moving a treadmill across his yard 2 days ago. He reports hearing a 'pop' at the time. He states he thought he had initially sprained and tried to wait it out, but the pain has been increasing since. Walking and movement increase his pain. Patient has NKDA.  Past Medical History  Diagnosis Date  . Rheumatoid arthritis   . Diabetes mellitus without complication   . Depression    Past Surgical History  Procedure Laterality Date  . Tonsillectomy    . Cervical spine surgery      X 3  . Colonoscopy N/A 03/26/2015    Procedure: COLONOSCOPY;  Surgeon: Darrell Dolin, MD;  Location: [facility];  Service: Endoscopy;  Laterality: N/A;  8:00 AM   No family history on file. Social History  Substance Use Topics  . Smoking status: Current Every Day Smoker -- 1.00 packs/day for 34 years    Types: Cigarettes  . Smokeless tobacco: None  . Alcohol Use: No    Review of Systems  Musculoskeletal: Positive for joint swelling (left ankle) and arthralgias (left ankle pain).  Skin: Positive for color change (redness).  All other systems reviewed and are negative.     Allergies  Review of patient's allergies indicates no known allergies.  Home Medications   Prior to Admission medications   Medication Sig Start Date End Date Taking? Authorizing Provider   folic acid (FOLVITE) 329 MCG tablet Take 800 mcg by mouth daily.    Yes Historical Provider, MD  methotrexate 2.5 MG tablet Take 15 mg by mouth once a week. saturday   Yes Historical Provider, MD  Omega-3 Fatty Acids (FISH OIL) 1000 MG CAPS Take 5,000 mg by mouth daily.    Yes Historical Provider, MD  omeprazole (PRILOSEC) 20 MG capsule Take 20 mg by mouth daily.   Yes Historical Provider, MD  naproxen (NAPROSYN) 500 MG tablet Take 1 tablet (500 mg total) by mouth 2 (two) times daily with a meal. 04/25/15   Darrell Morrison, MD  polyethylene glycol-electrolytes (TRILYTE) 420 G solution Take 4,000 mLs by mouth as directed. 03/20/15   Darrell Dolin, MD   BP 129/92 mmHg  Pulse 80  Temp(Src) 97.6 F (36.4 C) (Oral)  Resp 18  SpO2 97% Physical Exam  Constitutional: He is oriented to person, place, and time. He appears well-developed and well-nourished. No distress.  HENT:  Head: Normocephalic and atraumatic.  Eyes: Conjunctivae and EOM are normal.  Neck: Neck supple. No tracheal deviation present.  Cardiovascular: Normal rate.   Pulmonary/Chest: Effort normal. No respiratory distress.  Musculoskeletal: He exhibits tenderness.  Tenderness to distal fibula and malleolus on the left. No significant tenderness on the medial aspect. Significant tenderness to the proximal lateral left foot/ankle. Pulses intact. Decreased extension due to pain.   Neurological: He is alert and  oriented to person, place, and time.  Skin: Skin is warm and dry.  Psychiatric: He has a normal mood and affect. His behavior is normal.  Nursing note and vitals reviewed.   ED Course  Procedures (including critical care time)  DIAGNOSTIC STUDIES: Oxygen Saturation is 95% on RA, normal by my interpretation.    COORDINATION OF CARE: 9:43 AM - Discussed treatment plan with pt at bedside which includes XR and pain medication. Will provided splint, crutches, and ortho f/u. Pt verbalized understanding and agreed to plan.    Imaging Review No results found. I have personally reviewed and evaluated these images and lab results as part of my medical decision-making.   MDM   Final diagnoses:  Left ankle sprain, initial encounter   Patient presents after mechanical injury. X-ray reviewed no acute fracture. Patient stable for outpatient follow-up.  Results and differential diagnosis were discussed with the patient/parent/guardian. Xrays were independently reviewed by myself.  Close follow up outpatient was discussed, comfortable with the plan.   Medications  HYDROcodone-acetaminophen (NORCO/VICODIN) 5-325 MG per tablet 2 tablet (2 tablets Oral Given 04/25/15 0949)  ibuprofen (ADVIL,MOTRIN) tablet 600 mg (600 mg Oral Given 04/25/15 0950)    Filed Vitals:   04/25/15 0945 04/25/15 1055  BP: 154/97 129/92  Pulse: 87 80  Temp: 98.2 F (36.8 C) 97.6 F (36.4 C)  TempSrc: Oral Oral  Resp: 18 18  SpO2: 95% 97%    Final diagnoses:  Left ankle sprain, initial encounter      Darrell Morrison, MD 05/04/15 2234

## 2015-04-25 NOTE — ED Notes (Signed)
MD at bedside. 

## 2015-04-25 NOTE — ED Notes (Signed)
Pt discharged home, ambulatory with crutches. Prescriptions reviewed with pt and wife. Discharge instructions given using teach back method. Pt and wife given opportunity to ask any questions, all questions answered. VSS.

## 2015-04-26 DIAGNOSIS — F17210 Nicotine dependence, cigarettes, uncomplicated: Secondary | ICD-10-CM | POA: Diagnosis not present

## 2015-04-26 DIAGNOSIS — M0579 Rheumatoid arthritis with rheumatoid factor of multiple sites without organ or systems involvement: Secondary | ICD-10-CM | POA: Diagnosis not present

## 2015-04-26 DIAGNOSIS — S99912A Unspecified injury of left ankle, initial encounter: Secondary | ICD-10-CM | POA: Diagnosis not present

## 2015-04-26 DIAGNOSIS — E119 Type 2 diabetes mellitus without complications: Secondary | ICD-10-CM | POA: Diagnosis not present

## 2015-04-26 DIAGNOSIS — Z79899 Other long term (current) drug therapy: Secondary | ICD-10-CM | POA: Diagnosis not present

## 2015-05-03 DIAGNOSIS — S96912S Strain of unspecified muscle and tendon at ankle and foot level, left foot, sequela: Secondary | ICD-10-CM | POA: Diagnosis not present

## 2015-05-03 DIAGNOSIS — E119 Type 2 diabetes mellitus without complications: Secondary | ICD-10-CM | POA: Diagnosis not present

## 2015-05-03 DIAGNOSIS — E663 Overweight: Secondary | ICD-10-CM | POA: Diagnosis not present

## 2015-06-13 DIAGNOSIS — M069 Rheumatoid arthritis, unspecified: Secondary | ICD-10-CM | POA: Diagnosis not present

## 2015-06-13 DIAGNOSIS — E1165 Type 2 diabetes mellitus with hyperglycemia: Secondary | ICD-10-CM | POA: Diagnosis not present

## 2015-06-13 DIAGNOSIS — E118 Type 2 diabetes mellitus with unspecified complications: Secondary | ICD-10-CM | POA: Diagnosis not present

## 2015-06-13 DIAGNOSIS — R945 Abnormal results of liver function studies: Secondary | ICD-10-CM | POA: Diagnosis not present

## 2015-06-13 DIAGNOSIS — R7309 Other abnormal glucose: Secondary | ICD-10-CM | POA: Diagnosis not present

## 2015-06-13 DIAGNOSIS — F17200 Nicotine dependence, unspecified, uncomplicated: Secondary | ICD-10-CM | POA: Diagnosis not present

## 2015-06-15 DIAGNOSIS — N2889 Other specified disorders of kidney and ureter: Secondary | ICD-10-CM | POA: Diagnosis not present

## 2015-07-03 DIAGNOSIS — J209 Acute bronchitis, unspecified: Secondary | ICD-10-CM | POA: Diagnosis not present

## 2015-07-03 DIAGNOSIS — F17200 Nicotine dependence, unspecified, uncomplicated: Secondary | ICD-10-CM | POA: Diagnosis not present

## 2015-07-03 DIAGNOSIS — E1165 Type 2 diabetes mellitus with hyperglycemia: Secondary | ICD-10-CM | POA: Diagnosis not present

## 2015-07-03 DIAGNOSIS — E785 Hyperlipidemia, unspecified: Secondary | ICD-10-CM | POA: Diagnosis not present

## 2015-07-26 DIAGNOSIS — Z79899 Other long term (current) drug therapy: Secondary | ICD-10-CM | POA: Diagnosis not present

## 2015-07-26 DIAGNOSIS — M791 Myalgia: Secondary | ICD-10-CM | POA: Diagnosis not present

## 2015-07-26 DIAGNOSIS — M7070 Other bursitis of hip, unspecified hip: Secondary | ICD-10-CM | POA: Diagnosis not present

## 2015-07-26 DIAGNOSIS — M0579 Rheumatoid arthritis with rheumatoid factor of multiple sites without organ or systems involvement: Secondary | ICD-10-CM | POA: Diagnosis not present

## 2015-07-26 DIAGNOSIS — Z6831 Body mass index (BMI) 31.0-31.9, adult: Secondary | ICD-10-CM | POA: Diagnosis not present

## 2015-10-02 DIAGNOSIS — E1165 Type 2 diabetes mellitus with hyperglycemia: Secondary | ICD-10-CM | POA: Diagnosis not present

## 2015-10-02 DIAGNOSIS — J302 Other seasonal allergic rhinitis: Secondary | ICD-10-CM | POA: Diagnosis not present

## 2015-10-02 DIAGNOSIS — J40 Bronchitis, not specified as acute or chronic: Secondary | ICD-10-CM | POA: Diagnosis not present

## 2016-01-01 ENCOUNTER — Ambulatory Visit (HOSPITAL_COMMUNITY)
Admission: RE | Admit: 2016-01-01 | Discharge: 2016-01-01 | Disposition: A | Discharge status: Home / Self Care | Payer: Medicare Other | Source: Ambulatory Visit | Attending: Internal Medicine | Admitting: Internal Medicine

## 2016-01-01 ENCOUNTER — Other Ambulatory Visit (HOSPITAL_COMMUNITY): Payer: Self-pay | Admitting: Internal Medicine

## 2016-01-01 DIAGNOSIS — R52 Pain, unspecified: Secondary | ICD-10-CM

## 2016-01-01 DIAGNOSIS — M069 Rheumatoid arthritis, unspecified: Secondary | ICD-10-CM | POA: Diagnosis not present

## 2016-01-01 DIAGNOSIS — M549 Dorsalgia, unspecified: Secondary | ICD-10-CM | POA: Diagnosis not present

## 2016-01-01 DIAGNOSIS — R05 Cough: Secondary | ICD-10-CM | POA: Diagnosis not present

## 2016-01-01 DIAGNOSIS — E1165 Type 2 diabetes mellitus with hyperglycemia: Secondary | ICD-10-CM | POA: Diagnosis not present

## 2016-01-01 DIAGNOSIS — R079 Chest pain, unspecified: Secondary | ICD-10-CM | POA: Insufficient documentation

## 2016-01-01 DIAGNOSIS — R945 Abnormal results of liver function studies: Secondary | ICD-10-CM | POA: Diagnosis not present

## 2016-01-01 DIAGNOSIS — R7309 Other abnormal glucose: Secondary | ICD-10-CM | POA: Diagnosis not present

## 2016-01-01 DIAGNOSIS — F17200 Nicotine dependence, unspecified, uncomplicated: Secondary | ICD-10-CM | POA: Diagnosis not present

## 2016-01-08 DIAGNOSIS — E785 Hyperlipidemia, unspecified: Secondary | ICD-10-CM

## 2016-01-08 DIAGNOSIS — E1165 Type 2 diabetes mellitus with hyperglycemia: Secondary | ICD-10-CM

## 2016-01-08 DIAGNOSIS — F17200 Nicotine dependence, unspecified, uncomplicated: Secondary | ICD-10-CM

## 2016-01-08 DIAGNOSIS — E119 Type 2 diabetes mellitus without complications: Secondary | ICD-10-CM | POA: Insufficient documentation

## 2016-01-10 ENCOUNTER — Encounter: Payer: Self-pay | Admitting: Cardiovascular Disease

## 2016-01-10 ENCOUNTER — Telehealth: Payer: Self-pay | Admitting: Cardiovascular Disease

## 2016-01-10 ENCOUNTER — Ambulatory Visit (INDEPENDENT_AMBULATORY_CARE_PROVIDER_SITE_OTHER): Payer: Medicare Other | Admitting: Cardiovascular Disease

## 2016-01-10 VITALS — BP 124/66 | HR 79 | Ht 72.0 in | Wt 204.0 lb

## 2016-01-10 DIAGNOSIS — R0602 Shortness of breath: Secondary | ICD-10-CM

## 2016-01-10 DIAGNOSIS — R7309 Other abnormal glucose: Secondary | ICD-10-CM | POA: Diagnosis not present

## 2016-01-10 DIAGNOSIS — R739 Hyperglycemia, unspecified: Secondary | ICD-10-CM | POA: Diagnosis not present

## 2016-01-10 DIAGNOSIS — R072 Precordial pain: Secondary | ICD-10-CM

## 2016-01-10 NOTE — Telephone Encounter (Signed)
Please schedule sameday for both Echo and gxt

## 2016-01-10 NOTE — Patient Instructions (Signed)
Your physician recommends that you schedule a follow-up appointment in:  To be determined after tests     Get labs A1C   Your physician has requested that you have an echocardiogram. Echocardiography is a painless test that uses sound waves to create images of your heart. It provides your doctor with information about the size and shape of your heart and how well your heart's chambers and valves are working. This procedure takes approximately one hour. There are no restrictions for this procedure.    Your physician has requested that you have an exercise tolerance test. For further information please visit [email or web address]. Please also follow instruction sheet, as given.      Thank you for choosing [facility] !

## 2016-01-10 NOTE — Progress Notes (Signed)
Cardiology Office Note   Date:  01/10/2016   ID:  Amador Cunas, DOB November 18, 1965, MRN [id number]  PCP:  Rosita Fire, MD  Cardiologist:   Jenkins Rouge, MD   No chief complaint on file.     History of Present Illness: Darrell Terry is a 50 y.o. male who presents for evaluation of atypical chest pain. Has chronic cervical Spine issues with 3 surgeries. ? New back pain. Last few weeks SSCP. Seen by primary 6/6 and referred Pain is not exertional. Last minutes Intermittant over course of weeks. Not pleuritic or positional. Has arthritis And currently not on methotrexate. Has type 2 DM  Feels better when BS in 120-130 range Thinks he is on  Two much medicine. No previous stress test. Pain not related to indigestion or food. He is on disability  Lots of stress lately. He had his wifes grandkids (5) living with them over last 2 months The son in law Is now in jail    Past Medical History  Diagnosis Date  . Rheumatoid arthritis ([place])   . Diabetes mellitus without complication ([place])   . Depression     Past Surgical History  Procedure Laterality Date  . Tonsillectomy    . Cervical spine surgery      X 3  . Colonoscopy N/A 03/26/2015    Procedure: COLONOSCOPY;  Surgeon: Daneil Dolin, MD;  [facility];  Service: Endoscopy;  Laterality: N/A;  8:00 AM     Current Outpatient Prescriptions  Medication Sig Dispense Refill  . folic acid (FOLVITE) [id number] MCG tablet Take 800 mcg by mouth daily.     . metFORMIN (GLUCOPHAGE) 500 MG tablet Take 500 mg by mouth 2 (two) times daily.    . Omega-3 Fatty Acids (FISH OIL) 1000 MG CAPS Take 5,000 mg by mouth daily.     Marland Kitchen omeprazole (PRILOSEC) 20 MG capsule Take 20 mg by mouth daily.     No current facility-administered medications for this visit.    Allergies:   Review of patient's allergies indicates no known allergies.    Social History:  The patient  reports that he has been smoking Cigarettes.  He has a 34 pack-year  smoking history. He does not have any smokeless tobacco history on file. He reports that he uses illicit drugs (Marijuana). He reports that he does not drink alcohol.   Family History:  The patient's family history is not on file.    ROS:  Please see the history of present illness.   Otherwise, review of systems are positive for none.   All other systems are reviewed and negative.    PHYSICAL EXAM: VS:  BP 124/66 mmHg  Pulse 79  Ht 6' (1.829 m)  Wt 204 lb (92.534 kg)  BMI 27.66 kg/m2  SpO2 96% , BMI Body mass index is 27.66 kg/(m^2). Affect appropriate Healthy:  appears stated age 50: normal Neck supple with no adenopathy JVP normal no bruits no thyromegaly Lungs clear with no wheezing and good diaphragmatic motion Heart:  S1/S2 no murmur, no rub, gallop or click PMI normal Abdomen: benighn, BS positve, no tenderness, no AAA no bruit.  No HSM or HJR Distal pulses intact with no bruits No edema Neuro non-focal Skin warm and dry No muscular weakness    EKG:  NSR normal ECG 01/10/16   Recent Labs: No results found for requested labs within last 365 days.    Lipid Panel No results found for: CHOL, TRIG, HDL, CHOLHDL,  VLDL, LDLCALC, LDLDIRECT    Wt Readings from Last 3 Encounters:  01/10/16 204 lb (92.534 kg)  03/26/15 225 lb (102.059 kg)      Other studies Reviewed: Additional studies/ records that were reviewed today include: Dr Legrand Rams notes .    ASSESSMENT AND PLAN:  1.  Chest pain: atypical f/u ETT 2. Family history of valve diseae Dad with AVR  No murmur on exam f/u echo 3. DM  Check A1c today would be nice to at least decrease glucophage to once/day 4. Arthritis:  Needs referral to rheumatology does not like [facility] sees someone  Different every time Gave him Dr Elmon Else name 5. GERD:  Continue prilosec   Current medicines are reviewed at length with the patient today.  The patient does not have concerns regarding medicines.  The following changes  have been made:  no change  Labs/ tests ordered today include: A1c and ETT   No orders of the defined types were placed in this encounter.     Disposition:   FU with me PRN      Signed, Jenkins Rouge, MD  01/10/2016 2:23 PM    [facility] [address], [place], [place]  [address] Phone: [phone]; Fax: [phone]

## 2016-01-11 LAB — HEMOGLOBIN A1C
Hgb A1c MFr Bld: 5.9 % — ABNORMAL HIGH (ref <5.7)
MEAN PLASMA GLUCOSE: 123 mg/dL

## 2016-01-22 ENCOUNTER — Ambulatory Visit (HOSPITAL_COMMUNITY)
Admission: RE | Admit: 2016-01-22 | Discharge: 2016-01-22 | Disposition: A | Discharge status: Home / Self Care | Payer: Medicare Other | Source: Ambulatory Visit | Attending: Cardiovascular Disease | Admitting: Cardiovascular Disease

## 2016-01-22 ENCOUNTER — Ambulatory Visit (HOSPITAL_BASED_OUTPATIENT_CLINIC_OR_DEPARTMENT_OTHER)
Admission: RE | Admit: 2016-01-22 | Discharge: 2016-01-22 | Disposition: A | Discharge status: Home / Self Care | Payer: Medicare Other | Source: Ambulatory Visit | Attending: Cardiovascular Disease | Admitting: Cardiovascular Disease

## 2016-01-22 DIAGNOSIS — I358 Other nonrheumatic aortic valve disorders: Secondary | ICD-10-CM | POA: Diagnosis not present

## 2016-01-22 DIAGNOSIS — E785 Hyperlipidemia, unspecified: Secondary | ICD-10-CM | POA: Diagnosis not present

## 2016-01-22 DIAGNOSIS — Z720 Tobacco use: Secondary | ICD-10-CM | POA: Insufficient documentation

## 2016-01-22 DIAGNOSIS — E119 Type 2 diabetes mellitus without complications: Secondary | ICD-10-CM | POA: Insufficient documentation

## 2016-01-22 DIAGNOSIS — R0602 Shortness of breath: Secondary | ICD-10-CM

## 2016-01-22 DIAGNOSIS — R072 Precordial pain: Secondary | ICD-10-CM

## 2016-01-22 DIAGNOSIS — I517 Cardiomegaly: Secondary | ICD-10-CM | POA: Diagnosis not present

## 2016-01-22 LAB — EXERCISE TOLERANCE TEST
CHL CUP MPHR: 169 {beats}/min
CSEPEW: 10.1 METS
CSEPPHR: 146 {beats}/min
Exercise duration (min): 8 min
Exercise duration (sec): 1 s
Percent HR: 86 %
RPE: 14
Rest HR: 74 {beats}/min

## 2016-01-22 LAB — ECHOCARDIOGRAM COMPLETE
CHL CUP MV DEC (S): 257
E decel time: 257 msec
EERAT: 9.37
FS: 30 % (ref 28–44)
IV/PV OW: 0.92
LA ID, A-P, ES: 36 mm
LA diam index: 1.67 cm/m2
LA vol index: 19.2 mL/m2
LA vol: 41.2 mL
LAVOLA4C: 34.2 mL
LDCA: 2.54 cm2
LEFT ATRIUM END SYS DIAM: 36 mm
LV E/e' medial: 9.37
LV E/e'average: 9.37
LV PW d: 11.5 mm — AB (ref 0.6–1.1)
LV SIMPSON'S DISK: 59
LV TDI E'LATERAL: 8.38
LV dias vol index: 30 mL/m2
LV e' LATERAL: 8.38 cm/s
LV sys vol index: 13 mL/m2
LV sys vol: 27 mL (ref 21–61)
LVDIAVOL: 65 mL (ref 62–150)
LVOT diameter: 18 mm
MV pk E vel: 78.5 m/s
MVPG: 2 mmHg
MVPKAVEL: 69.9 m/s
Stroke v: 38 ml
TAPSE: 20.3 mm
TDI e' medial: 6.74

## 2016-01-22 NOTE — Progress Notes (Signed)
Quick Note:  Pt notified,copy to pcp ______

## 2016-01-22 NOTE — Progress Notes (Signed)
*  PRELIMINARY RESULTS* Echocardiogram 2D Echocardiogram has been performed.  Darrell Terry 01/22/2016, 10:11 AM

## 2016-03-06 DIAGNOSIS — M19271 Secondary osteoarthritis, right ankle and foot: Secondary | ICD-10-CM | POA: Diagnosis not present

## 2016-03-06 DIAGNOSIS — M19241 Secondary osteoarthritis, right hand: Secondary | ICD-10-CM | POA: Diagnosis not present

## 2016-03-12 DIAGNOSIS — M0579 Rheumatoid arthritis with rheumatoid factor of multiple sites without organ or systems involvement: Secondary | ICD-10-CM | POA: Diagnosis not present

## 2016-03-12 DIAGNOSIS — M0630 Rheumatoid nodule, unspecified site: Secondary | ICD-10-CM | POA: Diagnosis not present

## 2016-03-12 DIAGNOSIS — M069 Rheumatoid arthritis, unspecified: Secondary | ICD-10-CM | POA: Diagnosis not present

## 2016-05-15 DIAGNOSIS — Z23 Encounter for immunization: Secondary | ICD-10-CM | POA: Diagnosis not present

## 2016-06-12 DIAGNOSIS — M0630 Rheumatoid nodule, unspecified site: Secondary | ICD-10-CM | POA: Diagnosis not present

## 2016-06-12 DIAGNOSIS — M0579 Rheumatoid arthritis with rheumatoid factor of multiple sites without organ or systems involvement: Secondary | ICD-10-CM | POA: Diagnosis not present

## 2016-06-12 DIAGNOSIS — Z79899 Other long term (current) drug therapy: Secondary | ICD-10-CM | POA: Diagnosis not present

## 2016-06-12 DIAGNOSIS — M069 Rheumatoid arthritis, unspecified: Secondary | ICD-10-CM | POA: Diagnosis not present

## 2016-06-17 DIAGNOSIS — F17200 Nicotine dependence, unspecified, uncomplicated: Secondary | ICD-10-CM | POA: Diagnosis not present

## 2016-06-17 DIAGNOSIS — G542 Cervical root disorders, not elsewhere classified: Secondary | ICD-10-CM | POA: Diagnosis not present

## 2016-06-17 DIAGNOSIS — M069 Rheumatoid arthritis, unspecified: Secondary | ICD-10-CM | POA: Diagnosis not present

## 2016-06-17 DIAGNOSIS — E119 Type 2 diabetes mellitus without complications: Secondary | ICD-10-CM | POA: Diagnosis not present

## 2016-09-24 DIAGNOSIS — M0630 Rheumatoid nodule, unspecified site: Secondary | ICD-10-CM | POA: Diagnosis not present

## 2016-09-24 DIAGNOSIS — M0579 Rheumatoid arthritis with rheumatoid factor of multiple sites without organ or systems involvement: Secondary | ICD-10-CM | POA: Diagnosis not present

## 2016-09-24 DIAGNOSIS — M069 Rheumatoid arthritis, unspecified: Secondary | ICD-10-CM | POA: Diagnosis not present

## 2016-09-24 DIAGNOSIS — Z79899 Other long term (current) drug therapy: Secondary | ICD-10-CM | POA: Diagnosis not present

## 2016-11-03 DIAGNOSIS — E119 Type 2 diabetes mellitus without complications: Secondary | ICD-10-CM | POA: Diagnosis not present

## 2016-11-03 DIAGNOSIS — R945 Abnormal results of liver function studies: Secondary | ICD-10-CM | POA: Diagnosis not present

## 2016-11-03 DIAGNOSIS — F17200 Nicotine dependence, unspecified, uncomplicated: Secondary | ICD-10-CM | POA: Diagnosis not present

## 2016-11-03 DIAGNOSIS — Z79899 Other long term (current) drug therapy: Secondary | ICD-10-CM | POA: Diagnosis not present

## 2016-11-03 DIAGNOSIS — M069 Rheumatoid arthritis, unspecified: Secondary | ICD-10-CM | POA: Diagnosis not present

## 2016-11-03 DIAGNOSIS — I10 Essential (primary) hypertension: Secondary | ICD-10-CM | POA: Diagnosis not present

## 2016-11-03 DIAGNOSIS — R7309 Other abnormal glucose: Secondary | ICD-10-CM | POA: Diagnosis not present

## 2016-11-03 DIAGNOSIS — F17210 Nicotine dependence, cigarettes, uncomplicated: Secondary | ICD-10-CM | POA: Diagnosis not present

## 2016-11-03 DIAGNOSIS — E1165 Type 2 diabetes mellitus with hyperglycemia: Secondary | ICD-10-CM | POA: Diagnosis not present

## 2016-12-23 DIAGNOSIS — Z79899 Other long term (current) drug therapy: Secondary | ICD-10-CM | POA: Diagnosis not present

## 2016-12-23 DIAGNOSIS — M069 Rheumatoid arthritis, unspecified: Secondary | ICD-10-CM | POA: Diagnosis not present

## 2016-12-23 DIAGNOSIS — M0630 Rheumatoid nodule, unspecified site: Secondary | ICD-10-CM | POA: Diagnosis not present

## 2016-12-23 DIAGNOSIS — M0579 Rheumatoid arthritis with rheumatoid factor of multiple sites without organ or systems involvement: Secondary | ICD-10-CM | POA: Diagnosis not present

## 2016-12-23 DIAGNOSIS — E782 Mixed hyperlipidemia: Secondary | ICD-10-CM | POA: Diagnosis not present

## 2017-02-10 DIAGNOSIS — M5030 Other cervical disc degeneration, unspecified cervical region: Secondary | ICD-10-CM | POA: Diagnosis not present

## 2017-02-10 DIAGNOSIS — M069 Rheumatoid arthritis, unspecified: Secondary | ICD-10-CM | POA: Diagnosis not present

## 2017-02-10 DIAGNOSIS — E785 Hyperlipidemia, unspecified: Secondary | ICD-10-CM | POA: Diagnosis not present

## 2017-02-10 DIAGNOSIS — E1165 Type 2 diabetes mellitus with hyperglycemia: Secondary | ICD-10-CM | POA: Diagnosis not present

## 2017-05-11 DIAGNOSIS — F17200 Nicotine dependence, unspecified, uncomplicated: Secondary | ICD-10-CM | POA: Diagnosis not present

## 2017-05-11 DIAGNOSIS — M069 Rheumatoid arthritis, unspecified: Secondary | ICD-10-CM | POA: Diagnosis not present

## 2017-05-11 DIAGNOSIS — Z23 Encounter for immunization: Secondary | ICD-10-CM | POA: Diagnosis not present

## 2017-05-11 DIAGNOSIS — E119 Type 2 diabetes mellitus without complications: Secondary | ICD-10-CM | POA: Diagnosis not present

## 2017-05-11 DIAGNOSIS — E785 Hyperlipidemia, unspecified: Secondary | ICD-10-CM | POA: Diagnosis not present

## 2017-05-26 DIAGNOSIS — M50323 Other cervical disc degeneration at C6-C7 level: Secondary | ICD-10-CM | POA: Diagnosis not present

## 2017-05-26 DIAGNOSIS — Z981 Arthrodesis status: Secondary | ICD-10-CM | POA: Diagnosis not present

## 2017-05-26 DIAGNOSIS — Z7984 Long term (current) use of oral hypoglycemic drugs: Secondary | ICD-10-CM | POA: Diagnosis not present

## 2017-05-26 DIAGNOSIS — M4322 Fusion of spine, cervical region: Secondary | ICD-10-CM | POA: Diagnosis not present

## 2017-05-26 DIAGNOSIS — M4722 Other spondylosis with radiculopathy, cervical region: Secondary | ICD-10-CM | POA: Diagnosis not present

## 2017-05-26 DIAGNOSIS — E119 Type 2 diabetes mellitus without complications: Secondary | ICD-10-CM | POA: Diagnosis not present

## 2017-05-26 DIAGNOSIS — Z79899 Other long term (current) drug therapy: Secondary | ICD-10-CM | POA: Diagnosis not present

## 2017-05-26 DIAGNOSIS — F17210 Nicotine dependence, cigarettes, uncomplicated: Secondary | ICD-10-CM | POA: Diagnosis not present

## 2017-05-28 DIAGNOSIS — M4722 Other spondylosis with radiculopathy, cervical region: Secondary | ICD-10-CM | POA: Diagnosis not present

## 2017-05-28 DIAGNOSIS — M4802 Spinal stenosis, cervical region: Secondary | ICD-10-CM | POA: Diagnosis not present

## 2017-06-08 DIAGNOSIS — M4802 Spinal stenosis, cervical region: Secondary | ICD-10-CM | POA: Diagnosis not present

## 2017-06-08 DIAGNOSIS — R200 Anesthesia of skin: Secondary | ICD-10-CM | POA: Diagnosis not present

## 2017-06-08 DIAGNOSIS — Z981 Arthrodesis status: Secondary | ICD-10-CM | POA: Diagnosis not present

## 2017-06-08 DIAGNOSIS — M79601 Pain in right arm: Secondary | ICD-10-CM | POA: Diagnosis not present

## 2017-06-22 ENCOUNTER — Encounter (HOSPITAL_COMMUNITY): Payer: Self-pay

## 2017-06-22 ENCOUNTER — Ambulatory Visit (HOSPITAL_COMMUNITY): Payer: Medicare Other | Attending: Neurosurgery

## 2017-06-22 ENCOUNTER — Other Ambulatory Visit: Payer: Self-pay

## 2017-06-22 DIAGNOSIS — Z1389 Encounter for screening for other disorder: Secondary | ICD-10-CM | POA: Diagnosis not present

## 2017-06-22 DIAGNOSIS — M542 Cervicalgia: Secondary | ICD-10-CM | POA: Diagnosis not present

## 2017-06-22 DIAGNOSIS — R7309 Other abnormal glucose: Secondary | ICD-10-CM | POA: Diagnosis not present

## 2017-06-22 DIAGNOSIS — M069 Rheumatoid arthritis, unspecified: Secondary | ICD-10-CM | POA: Diagnosis not present

## 2017-06-22 DIAGNOSIS — R29898 Other symptoms and signs involving the musculoskeletal system: Secondary | ICD-10-CM

## 2017-06-22 DIAGNOSIS — Z79899 Other long term (current) drug therapy: Secondary | ICD-10-CM | POA: Diagnosis not present

## 2017-06-22 DIAGNOSIS — R293 Abnormal posture: Secondary | ICD-10-CM | POA: Diagnosis not present

## 2017-06-22 DIAGNOSIS — E785 Hyperlipidemia, unspecified: Secondary | ICD-10-CM | POA: Diagnosis not present

## 2017-06-22 DIAGNOSIS — E1165 Type 2 diabetes mellitus with hyperglycemia: Secondary | ICD-10-CM | POA: Diagnosis not present

## 2017-06-22 DIAGNOSIS — F17200 Nicotine dependence, unspecified, uncomplicated: Secondary | ICD-10-CM | POA: Diagnosis not present

## 2017-06-22 DIAGNOSIS — R945 Abnormal results of liver function studies: Secondary | ICD-10-CM | POA: Diagnosis not present

## 2017-06-22 DIAGNOSIS — F17210 Nicotine dependence, cigarettes, uncomplicated: Secondary | ICD-10-CM | POA: Diagnosis not present

## 2017-06-22 NOTE — Therapy (Signed)
[place], [place], [address] Phone: [phone]   Fax:  [phone]  Physical Therapy Evaluation  Patient Details  Name: Darrell Terry MRN: [id number] Date of Birth: 1974-04-14 Referring Provider: Gaspar Cola, MD   Encounter Date: 06/22/2017  PT End of Session - 06/22/17 1657    Visit Number  1    Number of Visits  13    Date for PT Re-Evaluation  07/13/17    Authorization Type  Medicare    Authorization Time Period  06/22/17 to 08/03/16    Authorization - Visit Number  1    Authorization - Number of Visits  10    PT Start Time  1300    PT Stop Time  1344    PT Time Calculation (min)  44 min    Activity Tolerance  Patient tolerated treatment well;No increased pain    Behavior During Therapy  [facility] for tasks assessed/performed       History reviewed. No pertinent past medical history.  History reviewed. No pertinent surgical history.  There were no vitals filed for this visit.   Subjective Assessment - 06/22/17 1305    Subjective  Pt reports he has been having neck pain for years. His first episode of neck pain happened years ago when he stood up and hit his head on an I beam, which led to emergency surgery. His 2nd surgery was for a C3-4 fusion. His last surgery was in either June or July of 2015 or 2016 for a C3-6 fusion after he injured his neck during a beach volleyball game and damaged his C6 disc. He reports this most recent bout of neck pain started without trauma and it began in his R shoulder first. He currently states that his C7 disc is deteriorating and he is trying to postpone a 4th surgery. He states that he has symptoms down BUE and these symptoms are limiting him from certain things. He reports a dull, achy pain down to R shoulder with no n/t, but has n/t down entire LUE.  He reports some facial tingling intermittently but denies any facial weakness or difficulty speaking when this happens. His ROM  of his neck and the pain are his main limiting factors. He does report issues with sleeping and states it wakes him up 3-4x/night on average    Pertinent History  h/o RA, DM, 3 previous neck surgeries    How long can you sit comfortably?  no issues    How long can you stand comfortably?  2-3 hours    How long can you walk comfortably?  no issues    Patient Stated Goals  prolong surery, alleviate some pain, and regain some motion    Currently in Pain?  Yes    Pain Score  2     Pain Location  Neck    Pain Orientation  Right    Pain Descriptors / Indicators  Aching;Dull    Pain Type  Chronic pain    Pain Onset  More than a month ago    Pain Frequency  Constant    Aggravating Factors   laying down, sitting and standing for too long    Pain Relieving Factors  nothing physically     Effect of Pain on Daily Activities  has had to adapt         [facility] PT Assessment - 06/22/17 0001      Assessment   Medical Diagnosis  Neck pain,  Right Radicular Pain    Referring Provider  Gaspar Cola, MD    Onset Date/Surgical Date  -- years ago    Hand Dominance  Right    Next MD Visit  -- no f/u    Prior Therapy  yes for neck      Balance Screen   Has the patient fallen in the past 6 months  No    Has the patient had a decrease in activity level because of a fear of falling?   No    Is the patient reluctant to leave their home because of a fear of falling?   No      Prior Function   Level of Independence  Independent    Vocation  On disability    Leisure  going on Cruises, traveling      Sensation   Light Touch  Appears Intact      Posture/Postural Control   Posture/Postural Control  Postural limitations    Postural Limitations  Rounded Shoulders;Forward head;Increased thoracic kyphosis      ROM / Strength   AROM / PROM / Strength  AROM;Strength      AROM   AROM Assessment Site  Cervical    Cervical Flexion  39 neck pain    Cervical Extension  24 neck pain    Cervical - Right Side Bend   21 R sided neck pain    Cervical - Left Side Bend  26 caused L shoulder to hurt    Cervical - Right Rotation  45 neck pain    Cervical - Left Rotation  37 L shoulder pain      Strength   Strength Assessment Site  Shoulder;Elbow;Wrist;Hand    Right Shoulder Flexion  5/5    Right Shoulder ABduction  5/5    Left Shoulder Flexion  5/5    Left Shoulder ABduction  5/5    Right Elbow Flexion  5/5    Right Elbow Extension  5/5    Left Elbow Flexion  5/5    Left Elbow Extension  5/5    Right Wrist Flexion  5/5    Right Wrist Extension  5/5    Left Wrist Flexion  5/5    Left Wrist Extension  5/5    Right Hand Gross Grasp  Functional    Left Hand Gross Grasp  Functional      Palpation   Spinal mobility  cervical and thoracic spine hypomobile, some tenderness to palpation    Palpation comment  Increased soft tissue restrictions and tenderness to palpation of UT, LS, cervical and thoracic paraspinals and periscap musculature, R>L throughout; increased pec minor tightness bil, recreated R shoulder pain; palpable knot in R UT which caused referred pain to R shoulder      Special Tests    Special Tests  Cervical    Cervical Tests  Spurling's;Dictraction;other      Spurling's   Findings  Negative    Comment  negative bil      Distraction Test   Findngs  Negative    Comment  increased stretch sensation on posterior neck      other    Findings  Unable to test    Comment  Attempted to assess neural tension of median nerve, but pt with increased guarding which occluded testing         Objective measurements completed on examination: See above findings.       PT Education - 06/22/17 1340    Education  provided  Yes    Education Details  exam findings, POC, HEP    Person(s) Educated  Patient    Methods  Explanation;Demonstration;Handout    Comprehension  Verbalized understanding;Returned demonstration       PT Short Term Goals - 06/22/17 1709      PT SHORT TERM GOAL #1   Title   Pt will be independent with HEP and perform consistently in order to decrease pain and maximize overall function.    Time  3    Period  Weeks    Status  New    Target Date  07/13/17      PT SHORT TERM GOAL #2   Title  Pt will have improved AROM by at least 10 deg in all directions to decrease pain.    Time  3    Period  Weeks    Status  New      PT SHORT TERM GOAL #3   Title  Pt will be able to demo proper set up and use of lumbar roll and have proper posture at least 75% of the time in order to maximize posture and decrease overall pain.    Time  3    Period  Weeks    Status  New        PT Long Term Goals - 06/22/17 1715      PT LONG TERM GOAL #1   Title  Pt will have score 14 or < on the NDI to demonstrate minimal perceived deficits from his neck pain in order to maximize his overall function with ADLs and IADLs.    Time  6    Period  Weeks    Status  New    Target Date  08/03/17      PT LONG TERM GOAL #2   Title  Pt will have improved cervical AROM  by 20deg or > throughout in order to allow him to drive and put his shoes on with greater ease.     Time  6    Period  Weeks    Status  New    Target Date  08/03/17      PT LONG TERM GOAL #3   Title  Pt will report being able to sleep at least 3 nights/week without awakening due to neck pain in order to maximize recovery.    Time  6    Period  Weeks    Status  New             Plan - 06/22/17 1658    Clinical Impression Statement  Pt is plesant 51 YO M who presents to OPPT with c/o chronic neck pain and BUE pain. Pt reports that he has pain down to his R shoulder and has n/t down his LUE. He currently presents with deficits in cervical AROM, posture, increased soft tissue restrictions, hypomobility of cervical and upper thoracic spine, and increased pain. Pt noted to be significantly guarded during assessment with difficulty relaxing. Attempted to assess nerve glides but due to muscle guarding, unable to get accurate  or appropriate assessment of test results. Pt's myotomes and dermatomes WNL. Palpation to R UT revealed palpable knot which referred pain to pt's R shoulder. Pt needs skilled PT intervention to address deficits in order to maximize overall function and QOL.    History and Personal Factors relevant to plan of care:  h/o RA, DM, 3 previous neck surgeries    Clinical Presentation  Stable  Clinical Presentation due to:  AROM, posture, soft tissue restrictions, spinal hypomobility    Clinical Decision Making  Low    Rehab Potential  Fair    PT Frequency  2x / week    PT Duration  6 weeks    PT Treatment/Interventions  ADLs/Self Care Home Management;Cryotherapy;Electrical Stimulation;Moist Heat;Traction;Therapeutic activities;Therapeutic exercise;Neuromuscular re-education;Patient/family education;Manual techniques;Passive range of motion;Dry needling;Energy conservation;Taping    PT Next Visit Plan  review goals and HEP; complete NDI; manual for soft tissue restrictions; add stretching for soft tissue restrictions, postural education and strenghtening    PT Home Exercise Plan  eval: seated cervical retractions    Consulted and Agree with Plan of Care  Patient       Patient will benefit from skilled therapeutic intervention in order to improve the following deficits and impairments:  Decreased activity tolerance, Decreased endurance, Decreased range of motion, Hypomobility, Increased fascial restricitons, Increased muscle spasms, Impaired flexibility, Improper body mechanics, Postural dysfunction, Pain  Visit Diagnosis: Cervicalgia - Plan: PT plan of care cert/re-cert  Abnormal posture - Plan: PT plan of care cert/re-cert  Other symptoms and signs involving the musculoskeletal system - Plan: PT plan of care cert/re-cert  G-Codes - 40/98/11 1722    Functional Assessment Tool Used (Outpatient Only)  clinical judgement, ROM, soft tissue restrictions, posture    Functional Limitation  Carrying,  moving and handling objects    Carrying, Moving and Handling Objects Current Status (B1478)  At least 40 percent but less than 60 percent impaired, limited or restricted    Carrying, Moving and Handling Objects Goal Status (G9562)  At least 1 percent but less than 20 percent impaired, limited or restricted    Carrying, Moving and Handling Objects Discharge Status (Z3086)  --        Problem List There are no active problems to display for this patient.      Geraldine Solar PT, [place] [address] [place], [place], [address] Phone: [phone]   Fax:  [phone]  Name: COAL NEARHOOD MRN: [id number] Date of Birth: [birth year]

## 2017-06-22 NOTE — Patient Instructions (Signed)
  Cervical Retraction Ronalee Red Tuck  While maintaining good posture, sit neutrally and place your pointer finger against your chin while looking straight ahead. Without moving the finger, draw your head directly backwards, keeping your gaze parallel to the floor. Return to neutral, bringing your chin back against your pointer finger. Make sure you are not going past the neutral neck position.

## 2017-06-23 ENCOUNTER — Encounter (HOSPITAL_COMMUNITY): Payer: Self-pay

## 2017-06-24 ENCOUNTER — Encounter (HOSPITAL_COMMUNITY): Payer: Self-pay

## 2017-06-25 ENCOUNTER — Ambulatory Visit (HOSPITAL_COMMUNITY): Payer: Medicare Other

## 2017-06-25 ENCOUNTER — Telehealth (HOSPITAL_COMMUNITY): Payer: Self-pay | Admitting: Internal Medicine

## 2017-06-25 NOTE — Telephone Encounter (Signed)
06/25/17  pt cx he said that he was sick

## 2017-06-26 ENCOUNTER — Encounter (HOSPITAL_COMMUNITY): Payer: Self-pay

## 2017-06-29 ENCOUNTER — Encounter (HOSPITAL_COMMUNITY): Payer: Self-pay

## 2017-06-30 ENCOUNTER — Telehealth (HOSPITAL_COMMUNITY): Payer: Self-pay | Admitting: Physical Therapy

## 2017-06-30 ENCOUNTER — Ambulatory Visit (HOSPITAL_COMMUNITY): Payer: Medicare Other | Admitting: Physical Therapy

## 2017-06-30 NOTE — Telephone Encounter (Signed)
Patient is still not feeling well and will see [place] on Thursday

## 2017-07-01 ENCOUNTER — Encounter (HOSPITAL_COMMUNITY): Payer: Self-pay

## 2017-07-02 ENCOUNTER — Ambulatory Visit (HOSPITAL_COMMUNITY): Payer: Medicare Other | Admitting: Physical Therapy

## 2017-07-02 ENCOUNTER — Telehealth (HOSPITAL_COMMUNITY): Payer: Self-pay | Admitting: Physical Therapy

## 2017-07-02 NOTE — Telephone Encounter (Signed)
Patient is still not feeling well and had to cancel, he will try to come in next week.

## 2017-07-03 ENCOUNTER — Encounter (HOSPITAL_COMMUNITY): Payer: Self-pay

## 2017-07-06 ENCOUNTER — Encounter (HOSPITAL_COMMUNITY): Payer: Self-pay | Admitting: Physical Therapy

## 2017-07-07 ENCOUNTER — Telehealth (HOSPITAL_COMMUNITY): Payer: Self-pay | Admitting: Physical Therapy

## 2017-07-07 ENCOUNTER — Ambulatory Visit (HOSPITAL_COMMUNITY): Payer: Medicare Other | Attending: Neurosurgery | Admitting: Physical Therapy

## 2017-07-07 DIAGNOSIS — H5203 Hypermetropia, bilateral: Secondary | ICD-10-CM | POA: Diagnosis not present

## 2017-07-07 DIAGNOSIS — H52203 Unspecified astigmatism, bilateral: Secondary | ICD-10-CM | POA: Diagnosis not present

## 2017-07-07 DIAGNOSIS — H524 Presbyopia: Secondary | ICD-10-CM | POA: Diagnosis not present

## 2017-07-07 DIAGNOSIS — E119 Type 2 diabetes mellitus without complications: Secondary | ICD-10-CM | POA: Diagnosis not present

## 2017-07-07 DIAGNOSIS — R293 Abnormal posture: Secondary | ICD-10-CM | POA: Insufficient documentation

## 2017-07-07 DIAGNOSIS — Z7984 Long term (current) use of oral hypoglycemic drugs: Secondary | ICD-10-CM | POA: Diagnosis not present

## 2017-07-07 DIAGNOSIS — R29898 Other symptoms and signs involving the musculoskeletal system: Secondary | ICD-10-CM | POA: Insufficient documentation

## 2017-07-07 DIAGNOSIS — M542 Cervicalgia: Secondary | ICD-10-CM | POA: Insufficient documentation

## 2017-07-07 NOTE — Telephone Encounter (Signed)
PT did not show for appointment (#1).  Called and spoke to patient who reports he is still under the weather and also unable to get out of his driveway due to the snow.  Reminded of Thursday appointment at 1:00 and reports he will be here.  Reminded of NS policy Teena Irani, PTA/CLT [phone]

## 2017-07-08 ENCOUNTER — Encounter (HOSPITAL_COMMUNITY): Payer: Self-pay

## 2017-07-09 ENCOUNTER — Ambulatory Visit (HOSPITAL_COMMUNITY): Payer: Medicare Other | Admitting: Physical Therapy

## 2017-07-09 DIAGNOSIS — R29898 Other symptoms and signs involving the musculoskeletal system: Secondary | ICD-10-CM

## 2017-07-09 DIAGNOSIS — R293 Abnormal posture: Secondary | ICD-10-CM | POA: Diagnosis not present

## 2017-07-09 DIAGNOSIS — M542 Cervicalgia: Secondary | ICD-10-CM | POA: Diagnosis not present

## 2017-07-09 NOTE — Therapy (Signed)
[place], [place], [address] Phone: [phone]   Fax:  [phone]  Physical Therapy Treatment  Patient Details  Name: Darrell Terry MRN: [id number] Date of Birth: 07/02/1966 Referring Provider: Gaspar Cola, MD   Encounter Date: 07/09/2017  PT End of Session - 07/09/17 1450    Visit Number  2    Number of Visits  13    Date for PT Re-Evaluation  07/13/17    Authorization Type  Medicare    Authorization Time Period  06/22/17 to 08/03/16    Authorization - Visit Number  2    Authorization - Number of Visits  10    PT Start Time  1856    PT Stop Time  3149    PT Time Calculation (min)  44 min    Activity Tolerance  Patient tolerated treatment well;No increased pain    Behavior During Therapy  WFL for tasks assessed/performed       Past Medical History:  Diagnosis Date  . Depression   . Diabetes mellitus without complication ([place])   . Rheumatoid arthritis [facility])     Past Surgical History:  Procedure Laterality Date  . CERVICAL SPINE SURGERY     X 3  . COLONOSCOPY N/A 03/26/2015   Procedure: COLONOSCOPY;  Surgeon: Daneil Dolin, MD;  Location[facility];  Service: Endoscopy;  Laterality: N/A;  8:00 AM  . TONSILLECTOMY      There were no vitals filed for this visit.  Subjective Assessment - 07/09/17 1349    Subjective  Pt states he's missed the last 2 weeks due to illness.  STates his pain remains the same and currently at 3/10.  States the Rt side hurts worse.    Currently in Pain?  Yes    Pain Score  3     Pain Location  Neck    Pain Orientation  Right    Pain Descriptors / Indicators  Aching    Pain Type  Chronic pain         [facility] PT Assessment - 07/09/17 0001      Assessment   Medical Diagnosis  Neck pain, Right Radicular Pain      Observation/Other Assessments   Neck Disability Index   18                  [facility] Adult PT Treatment/Exercise - 07/09/17 0001      Neck  Exercises: Seated   W Back  10 reps    Other Seated Exercise  AROM all, cervical excursions 5 reps each      Manual Therapy   Manual Therapy  Soft tissue mobilization    Manual therapy comments  completed seperately from all other skilled interventions    Soft tissue mobilization  to bil UT, Rt>Lt             PT Education - 07/09/17 1450    Education provided  Yes    Education Details  reviewed goals and HEP    Person(s) Educated  Patient    Methods  Explanation;Tactile cues;Verbal cues;Handout;Demonstration    Comprehension  Verbalized understanding;Returned demonstration;Verbal cues required;Tactile cues required       PT Short Term Goals - 06/22/17 1709      PT SHORT TERM GOAL #1   Title  Pt will be independent with HEP and perform consistently in order to decrease pain and maximize overall function.    Time  3  Period  Weeks    Status  New    Target Date  07/13/17      PT SHORT TERM GOAL #2   Title  Pt will have improved AROM by at least 10 deg in all directions to decrease pain.    Time  3    Period  Weeks    Status  New      PT SHORT TERM GOAL #3   Title  Pt will be able to demo proper set up and use of lumbar roll and have proper posture at least 75% of the time in order to maximize posture and decrease overall pain.    Time  3    Period  Weeks    Status  New        PT Long Term Goals - 06/22/17 1715      PT LONG TERM GOAL #1   Title  Pt will have score 14 or < on the NDI to demonstrate minimal perceived deficits from his neck pain in order to maximize his overall function with ADLs and IADLs.    Time  6    Period  Weeks    Status  New    Target Date  08/03/17      PT LONG TERM GOAL #2   Title  Pt will have improved cervical AROM  by 20deg or > throughout in order to allow him to drive and put his shoes on with greater ease.     Time  6    Period  Weeks    Status  New    Target Date  08/03/17      PT LONG TERM GOAL #3   Title  Pt will report  being able to sleep at least 3 nights/week without awakening due to neck pain in order to maximize recovery.    Time  6    Period  Weeks    Status  New            Plan - 07/09/17 1511    Clinical Impression Statement  INitial evaluation goals and HEP reviewed this session.  Neck Disability index completed with score of 18.  Initiaated AROM for cervical spine as well as manual to decrased spasm and restrictions.  Pt able to complete all without difficulty or complaints.  Noted tightness in B cervical/trap regions.     Rehab Potential  Fair    PT Frequency  2x / week    PT Duration  6 weeks    PT Treatment/Interventions  ADLs/Self Care Home Management;Cryotherapy;Electrical Stimulation;Moist Heat;Traction;Therapeutic activities;Therapeutic exercise;Neuromuscular re-education;Patient/family education;Manual techniques;Passive range of motion;Dry needling;Energy conservation;Taping    PT Next Visit Plan  Continue with manual for soft tissue restrictions; add stretching for soft tissue restrictions, postural education and strenghtening    PT Home Exercise Plan  eval: seated cervical retractions    Consulted and Agree with Plan of Care  Patient       Patient will benefit from skilled therapeutic intervention in order to improve the following deficits and impairments:  Decreased activity tolerance, Decreased endurance, Decreased range of motion, Hypomobility, Increased fascial restricitons, Increased muscle spasms, Impaired flexibility, Improper body mechanics, Postural dysfunction, Pain  Visit Diagnosis: Cervicalgia  Abnormal posture  Other symptoms and signs involving the musculoskeletal system     Problem List Patient Active Problem List   Diagnosis Date Noted  . Hyperlipidemia 01/08/2016  . Type 2 diabetes mellitus with hyperglycemia ([place]) 01/08/2016  . Nicotine dependence 01/08/2016  .  ED (erectile dysfunction) of organic origin 06/21/2013  . Cervicogenic headache 03/03/2013   . Cervico-occipital neuralgia 03/03/2013  . Cervical osteoarthritis 08/09/2012  . Cervical pain 11/27/2011   Teena Irani, PTA/CLT [phone]  Teena Irani 07/09/2017, 3:14 PM  [facility] [address] [place], [place], [address] Phone: [phone]   Fax:  [phone]  Name: HAI GRABE MRN: [id number] Date of Birth: 1966/07/18

## 2017-07-10 ENCOUNTER — Encounter (HOSPITAL_COMMUNITY): Payer: Self-pay

## 2017-07-13 ENCOUNTER — Encounter (HOSPITAL_COMMUNITY): Payer: Self-pay

## 2017-07-14 ENCOUNTER — Ambulatory Visit (HOSPITAL_COMMUNITY): Payer: Medicare Other

## 2017-07-14 ENCOUNTER — Other Ambulatory Visit: Payer: Self-pay

## 2017-07-14 DIAGNOSIS — M542 Cervicalgia: Secondary | ICD-10-CM

## 2017-07-14 DIAGNOSIS — R293 Abnormal posture: Secondary | ICD-10-CM | POA: Diagnosis not present

## 2017-07-14 DIAGNOSIS — R29898 Other symptoms and signs involving the musculoskeletal system: Secondary | ICD-10-CM | POA: Diagnosis not present

## 2017-07-14 NOTE — Therapy (Signed)
[place], [place], [address] Phone: [phone]   Fax:  [phone]  Physical Therapy Treatment  Patient Details  Name: Darrell Terry MRN: [id number] Date of Birth: 05-16-1966 Referring Provider: Gaspar Cola, MD   Encounter Date: 07/14/2017  PT End of Session - 07/14/17 1258    Visit Number  3    Number of Visits  13    Date for PT Re-Evaluation  08/03/16 changing reassessment date since pt has only been to 1 f/u treatment session since initial evaluation    Authorization Type  Medicare    Authorization Time Period  06/22/17 to 08/03/16    Authorization - Visit Number  3    Authorization - Number of Visits  10    PT Start Time  6546    PT Stop Time  1340    PT Time Calculation (min)  42 min    Activity Tolerance  Patient tolerated treatment well;No increased pain    Behavior During Therapy  WFL for tasks assessed/performed       Past Medical History:  Diagnosis Date  . Depression   . Diabetes mellitus without complication ([place])   . Rheumatoid arthritis [facility])     Past Surgical History:  Procedure Laterality Date  . CERVICAL SPINE SURGERY     X 3  . COLONOSCOPY N/A 03/26/2015   Procedure: COLONOSCOPY;  Surgeon: Daneil Dolin, MD;  [facility];  Service: Endoscopy;  Laterality: N/A;  8:00 AM  . TONSILLECTOMY      There were no vitals filed for this visit.  Subjective Assessment - 07/14/17 1300    Subjective  Pt reports feeling better after his bout with pneumonia. He rates his pain at 2/10 currently.     Currently in Pain?  Yes    Pain Score  2     Pain Location  Shoulder    Pain Orientation  Right    Pain Descriptors / Indicators  Aching    Pain Type  Chronic pain    Pain Onset  More than a month ago    Pain Frequency  Constant    Aggravating Factors   laying down, sitting and standing for too long    Pain Relieving Factors  nothin physically     Effect of Pain on Daily  Activities  has had to adapt            [facility] Adult PT Treatment/Exercise - 07/14/17 0001      Neck Exercises: Machines for Strengthening   UBE (Upper Arm Bike)  retro x3 mins for postural strengthening      Neck Exercises: Standing   Other Standing Exercises  scap retraction and shoulder ext with RTB x10 reps each      Neck Exercises: Seated   Other Seated Exercise  AROM all, cervical excursions X10reps each    Other Seated Exercise  3D thoracic excursions x10 reps each      Manual Therapy   Manual Therapy  Soft tissue mobilization    Manual therapy comments  completed seperately from all other skilled interventions    Soft tissue mobilization  to bil UT, Rt>Lt, periscapular musculature; subioccipital release x5 mins      Neck Exercises: Stretches   Upper Trapezius Stretch  2 reps;30 seconds bil    Levator Stretch  2 reps;30 seconds bil          PT Education - 07/14/17 1301  Education provided  Yes    Person(s) Educated  Patient    Methods  Explanation;Demonstration    Comprehension  Verbalized understanding;Returned demonstration       PT Short Term Goals - 06/22/17 1709      PT SHORT TERM GOAL #1   Title  Pt will be independent with HEP and perform consistently in order to decrease pain and maximize overall function.    Time  3    Period  Weeks    Status  New    Target Date  07/13/17      PT SHORT TERM GOAL #2   Title  Pt will have improved AROM by at least 10 deg in all directions to decrease pain.    Time  3    Period  Weeks    Status  New      PT SHORT TERM GOAL #3   Title  Pt will be able to demo proper set up and use of lumbar roll and have proper posture at least 75% of the time in order to maximize posture and decrease overall pain.    Time  3    Period  Weeks    Status  New        PT Long Term Goals - 06/22/17 1715      PT LONG TERM GOAL #1   Title  Pt will have score 14 or < on the NDI to demonstrate minimal perceived deficits from his  neck pain in order to maximize his overall function with ADLs and IADLs.    Time  6    Period  Weeks    Status  New    Target Date  08/03/17      PT LONG TERM GOAL #2   Title  Pt will have improved cervical AROM  by 20deg or > throughout in order to allow him to drive and put his shoes on with greater ease.     Time  6    Period  Weeks    Status  New    Target Date  08/03/17      PT LONG TERM GOAL #3   Title  Pt will report being able to sleep at least 3 nights/week without awakening due to neck pain in order to maximize recovery.    Time  6    Period  Weeks    Status  New            Plan - 07/14/17 1340    Clinical Impression Statement  PT deciding to hold pt's reassessment this date as this was only his 2nd f/u visit since his initial evaluation. Continued with cervical and thoracic mobility work, adding postural strengthening this date. Ended with manual therapy to UT and periscap musculature. He reported slightly increased pain in neck following manual therapy; PT explained that he will likely be tender to touch following but this should go away in 24-48 hours and he verbalized understanding.  Told pt that he could perform stretches at home as he states he already does that so f/u next visit and assess how he felt and then officially add UT and LS stretches to HEP.     Rehab Potential  Fair    PT Frequency  2x / week    PT Duration  6 weeks    PT Treatment/Interventions  ADLs/Self Care Home Management;Cryotherapy;Electrical Stimulation;Moist Heat;Traction;Therapeutic activities;Therapeutic exercise;Neuromuscular re-education;Patient/family education;Manual techniques;Passive range of motion;Dry needling;Energy conservation;Taping    PT Next Visit Plan  Continue with manual for soft tissue restrictions; continue stretching for soft tissue restrictions, postural education and strenghtening; assess response to stretching and add UT and LS stretches to HEP    PT Home Exercise Plan   eval: seated cervical retractions    Consulted and Agree with Plan of Care  Patient       Patient will benefit from skilled therapeutic intervention in order to improve the following deficits and impairments:  Decreased activity tolerance, Decreased endurance, Decreased range of motion, Hypomobility, Increased fascial restricitons, Increased muscle spasms, Impaired flexibility, Improper body mechanics, Postural dysfunction, Pain  Visit Diagnosis: Cervicalgia  Abnormal posture  Other symptoms and signs involving the musculoskeletal system     Problem List Patient Active Problem List   Diagnosis Date Noted  . Hyperlipidemia 01/08/2016  . Type 2 diabetes mellitus with hyperglycemia ([place]) 01/08/2016  . Nicotine dependence 01/08/2016  . ED (erectile dysfunction) of organic origin 06/21/2013  . Cervicogenic headache 03/03/2013  . Cervico-occipital neuralgia 03/03/2013  . Cervical osteoarthritis 08/09/2012  . Cervical pain 11/27/2011      Geraldine Solar PT, DPT  [facility] [address] [place], [place], [address] Phone: [phone]   Fax:  [phone]  Name: DOMINICK MORELLA MRN: [id number] Date of Birth: 1966-06-04

## 2017-07-15 ENCOUNTER — Encounter (HOSPITAL_COMMUNITY): Payer: Self-pay

## 2017-07-16 ENCOUNTER — Other Ambulatory Visit: Payer: Self-pay

## 2017-07-16 ENCOUNTER — Ambulatory Visit (HOSPITAL_COMMUNITY): Payer: Medicare Other

## 2017-07-16 ENCOUNTER — Encounter (HOSPITAL_COMMUNITY): Payer: Self-pay

## 2017-07-16 DIAGNOSIS — M542 Cervicalgia: Secondary | ICD-10-CM | POA: Diagnosis not present

## 2017-07-16 DIAGNOSIS — R29898 Other symptoms and signs involving the musculoskeletal system: Secondary | ICD-10-CM | POA: Diagnosis not present

## 2017-07-16 DIAGNOSIS — R293 Abnormal posture: Secondary | ICD-10-CM

## 2017-07-16 NOTE — Patient Instructions (Addendum)
  UPPER TRAP STRETCH - HOLDING CHAIR  While sitting in a chair, hold the seat with one hand and bend your head towards the opposite side for a gentle stretch to the side of the neck.   Right Levator Scapulae Stretch  Begin by retracting your head back into a chin tuck position with shoulder blades back. Next, place right hand under your left upper thigh/leg with your palm facing up and lean your nose toward your left armpit.  You should be looking towards your opposite pocket of the affected side.  Gentle is important, do not go into pain.      Perform 1-2x/day, 3-5 stretches holding for 30-60 seconds each side

## 2017-07-16 NOTE — Therapy (Signed)
[place], [place], [address] Phone: [phone]   Fax:  [phone]  Physical Therapy Treatment  Patient Details  Name: Darrell Terry MRN: [id number] Date of Birth: Mar 29, 1966 Referring Provider: Gaspar Cola, MD   Encounter Date: 07/16/2017  PT End of Session - 07/16/17 1349    Visit Number  4    Number of Visits  13    Date for PT Re-Evaluation  08/03/16 changing reassessment date since pt has only been to 1 f/u treatment session since initial evaluation    Authorization Type  Medicare    Authorization Time Period  06/22/17 to 08/03/16    Authorization - Visit Number  4    Authorization - Number of Visits  10    PT Start Time  1346    PT Stop Time  1426    PT Time Calculation (min)  40 min    Activity Tolerance  Patient tolerated treatment well;No increased pain    Behavior During Therapy  WFL for tasks assessed/performed       Past Medical History:  Diagnosis Date  . Depression   . Diabetes mellitus without complication ([place])   . Rheumatoid arthritis [facility])     Past Surgical History:  Procedure Laterality Date  . CERVICAL SPINE SURGERY     X 3  . COLONOSCOPY N/A 03/26/2015   Procedure: COLONOSCOPY;  Surgeon: Daneil Dolin, MD;  [facility];  Service: Endoscopy;  Laterality: N/A;  8:00 AM  . TONSILLECTOMY      There were no vitals filed for this visit.  Subjective Assessment - 07/16/17 1350    Subjective  Pt reports that this damp weather is really bothering him.     Currently in Pain?  Yes    Pain Score  5     Pain Location  -- whole body    Pain Descriptors / Indicators  Aching    Pain Type  Chronic pain    Pain Onset  More than a month ago    Pain Frequency  Constant    Aggravating Factors   laying down, sitting and standing for too long    Pain Relieving Factors  nothing physically     Effect of Pain on Daily Activities  has had to adapt          [facility] Adult PT  Treatment/Exercise - 07/16/17 0001      Neck Exercises: Standing   Other Standing Exercises  scap retraction and shoulder ext with GTB x10 reps each      Neck Exercises: Seated   Other Seated Exercise  AROM all, cervical excursions x10reps each    Other Seated Exercise  thoracic extension x10 reps each      Manual Therapy   Manual Therapy  Soft tissue mobilization    Manual therapy comments  completed seperately from all other skilled interventions    Soft tissue mobilization  IASTM with green ball to bil thoracic paraspinals, L>R; STM to bil UT       Neck Exercises: Stretches   Upper Trapezius Stretch  2 reps;30 seconds bil    Levator Stretch  2 reps;30 seconds bil    Other Neck Stretches  scalene stretch with towel 2x30" bil           PT Education - 07/16/17 1350    Education provided  Yes    Education Details  exercise technique, added UT and LS stretches to HEP  Person(s) Educated  Patient    Methods  Explanation;Demonstration;Handout    Comprehension  Verbalized understanding;Returned demonstration       PT Short Term Goals - 06/22/17 1709      PT SHORT TERM GOAL #1   Title  Pt will be independent with HEP and perform consistently in order to decrease pain and maximize overall function.    Time  3    Period  Weeks    Status  New    Target Date  07/13/17      PT SHORT TERM GOAL #2   Title  Pt will have improved AROM by at least 10 deg in all directions to decrease pain.    Time  3    Period  Weeks    Status  New      PT SHORT TERM GOAL #3   Title  Pt will be able to demo proper set up and use of lumbar roll and have proper posture at least 75% of the time in order to maximize posture and decrease overall pain.    Time  3    Period  Weeks    Status  New        PT Long Term Goals - 06/22/17 1715      PT LONG TERM GOAL #1   Title  Pt will have score 14 or < on the NDI to demonstrate minimal perceived deficits from his neck pain in order to maximize his  overall function with ADLs and IADLs.    Time  6    Period  Weeks    Status  New    Target Date  08/03/17      PT LONG TERM GOAL #2   Title  Pt will have improved cervical AROM  by 20deg or > throughout in order to allow him to drive and put his shoes on with greater ease.     Time  6    Period  Weeks    Status  New    Target Date  08/03/17      PT LONG TERM GOAL #3   Title  Pt will report being able to sleep at least 3 nights/week without awakening due to neck pain in order to maximize recovery.    Time  6    Period  Weeks    Status  New            Plan - 07/16/17 1429    Clinical Impression Statement  Pt presents to therapy with increased pain this date, attributing it to the weather. Began with stretches this date. Pt with good form so added to HEP. Pt had another spasm during L LF of 3D thoracic excursions and was unable to complete any more reps due to pain. Attempted to stretch muscle out in child's post and seated R rotation stretch but pt either had increased pain and then another spasm on the R during the seated stretch so did not continue that either. Ended session with manual with green ball to address soft tissue spasms/restrictions. Pt reported slightly increased pain at EOS, as he did last time, however, he reported that he felt back to normal after a few hours. PT explained that is a normal response and he should expect that again this time and he verbalized understanding.     Rehab Potential  Fair    PT Frequency  2x / week    PT Duration  6 weeks    PT Treatment/Interventions  ADLs/Self Care Home Management;Cryotherapy;Electrical Stimulation;Moist Heat;Traction;Therapeutic activities;Therapeutic exercise;Neuromuscular re-education;Patient/family education;Manual techniques;Passive range of motion;Dry needling;Energy conservation;Taping    PT Next Visit Plan  Continue with manual for soft tissue restrictions; continue stretching for soft tissue restrictions, postural  education and strenghtening    PT Home Exercise Plan  eval: seated cervical retractions; 12/20: UT and LS stretching    Consulted and Agree with Plan of Care  Patient       Patient will benefit from skilled therapeutic intervention in order to improve the following deficits and impairments:  Decreased activity tolerance, Decreased endurance, Decreased range of motion, Hypomobility, Increased fascial restricitons, Increased muscle spasms, Impaired flexibility, Improper body mechanics, Postural dysfunction, Pain  Visit Diagnosis: Cervicalgia  Abnormal posture  Other symptoms and signs involving the musculoskeletal system     Problem List Patient Active Problem List   Diagnosis Date Noted  . Hyperlipidemia 01/08/2016  . Type 2 diabetes mellitus with hyperglycemia ([place]) 01/08/2016  . Nicotine dependence 01/08/2016  . ED (erectile dysfunction) of organic origin 06/21/2013  . Cervicogenic headache 03/03/2013  . Cervico-occipital neuralgia 03/03/2013  . Cervical osteoarthritis 08/09/2012  . Cervical pain 11/27/2011       Geraldine Solar PT, DPT  [facility] [address] [place], [place], [address] Phone: [phone]   Fax:  [phone]  Name: Darrell Terry MRN: [id number] Date of Birth: 1965-08-10

## 2017-07-17 ENCOUNTER — Encounter (HOSPITAL_COMMUNITY): Payer: Self-pay

## 2017-07-20 ENCOUNTER — Encounter (HOSPITAL_COMMUNITY): Payer: Self-pay

## 2017-07-22 ENCOUNTER — Encounter (HOSPITAL_COMMUNITY): Payer: Self-pay

## 2017-07-23 ENCOUNTER — Ambulatory Visit (HOSPITAL_COMMUNITY): Payer: Medicare Other

## 2017-07-24 ENCOUNTER — Encounter (HOSPITAL_COMMUNITY): Payer: Self-pay

## 2017-07-27 ENCOUNTER — Encounter (HOSPITAL_COMMUNITY): Payer: Self-pay

## 2017-07-29 ENCOUNTER — Encounter (HOSPITAL_COMMUNITY): Payer: Self-pay

## 2017-07-30 ENCOUNTER — Encounter (HOSPITAL_COMMUNITY): Payer: Self-pay

## 2017-07-30 ENCOUNTER — Ambulatory Visit (HOSPITAL_COMMUNITY): Payer: Medicare Other | Attending: Neurosurgery

## 2017-07-30 DIAGNOSIS — R29898 Other symptoms and signs involving the musculoskeletal system: Secondary | ICD-10-CM

## 2017-07-30 DIAGNOSIS — M542 Cervicalgia: Secondary | ICD-10-CM | POA: Diagnosis not present

## 2017-07-30 DIAGNOSIS — R293 Abnormal posture: Secondary | ICD-10-CM | POA: Diagnosis not present

## 2017-07-30 NOTE — Therapy (Signed)
[place], [place], [address] Phone: [phone]   Fax:  [phone]  Physical Therapy Treatment  Patient Details  Name: Darrell Terry MRN: [id number] Date of Birth: 02-25-1966 Referring Provider: Gaspar Cola, MD   Encounter Date: 07/30/2017  PT End of Session - 07/30/17 1356    Visit Number  5    Number of Visits  13    Authorization Type  Medicare    Authorization Time Period  06/22/17 to 08/03/16    Authorization - Visit Number  5    Authorization - Number of Visits  10    PT Start Time  5852    PT Stop Time  7782    PT Time Calculation (min)  39 min    Activity Tolerance  Patient tolerated treatment well;No increased pain    Behavior During Therapy  WFL for tasks assessed/performed       Past Medical History:  Diagnosis Date  . Depression   . Diabetes mellitus without complication ([place])   . Rheumatoid arthritis [facility])     Past Surgical History:  Procedure Laterality Date  . CERVICAL SPINE SURGERY     X 3  . COLONOSCOPY N/A 03/26/2015   Procedure: COLONOSCOPY;  Surgeon: Daneil Dolin, MD;  [facility];  Service: Endoscopy;  Laterality: N/A;  8:00 AM  . TONSILLECTOMY      There were no vitals filed for this visit.  Subjective Assessment - 07/30/17 1347    Subjective  Pt stated he continues to c/o tightness and sore in both Rt and Lt side of neck.  Returns to MD for injection.    Pertinent History  h/o RA, DM, 3 previous neck surgeries    Patient Stated Goals  prolong surery, alleviate some pain, and regain some motion    Currently in Pain?  Yes    Pain Score  3     Pain Location  Neck    Pain Orientation  Right;Left    Pain Descriptors / Indicators  Sore;Tightness    Pain Type  Chronic pain    Pain Onset  More than a month ago    Pain Frequency  Constant    Aggravating Factors   laying down, sitting and standing for too long    Pain Relieving Factors  nothing physcially     Effect of Pain on Daily Activities  has had to adapt                      [facility] Adult PT Treatment/Exercise - 07/30/17 0001      Posture/Postural Control   Posture/Postural Control  Postural limitations    Postural Limitations  Rounded Shoulders;Forward head;Increased thoracic kyphosis      Neck Exercises: Standing   Other Standing Exercises  scap retraction and shoulder ext with GTB x10 reps each      Neck Exercises: Seated   W Back  10 reps    Shoulder Rolls  Backwards;10 reps shoulders up, back and down, relax    Other Seated Exercise  AROM all, cervical excursions x10reps each    Other Seated Exercise  educated importance and benefits with seated posture and lumbar support      Manual Therapy   Manual Therapy  Soft tissue mobilization    Manual therapy comments  completed seperately from all other skilled interventions    Soft tissue mobilization  STM to upper traps, pecs and deltoids in  supine with LE supported      Neck Exercises: Stretches   Corner Stretch  2 reps;30 seconds               PT Short Term Goals - 06/22/17 1709      PT SHORT TERM GOAL #1   Title  Pt will be independent with HEP and perform consistently in order to decrease pain and maximize overall function.    Time  3    Period  Weeks    Status  New    Target Date  07/13/17      PT SHORT TERM GOAL #2   Title  Pt will have improved AROM by at least 10 deg in all directions to decrease pain.    Time  3    Period  Weeks    Status  New      PT SHORT TERM GOAL #3   Title  Pt will be able to demo proper set up and use of lumbar roll and have proper posture at least 75% of the time in order to maximize posture and decrease overall pain.    Time  3    Period  Weeks    Status  New        PT Long Term Goals - 06/22/17 1715      PT LONG TERM GOAL #1   Title  Pt will have score 14 or < on the NDI to demonstrate minimal perceived deficits from his neck pain in order to maximize his  overall function with ADLs and IADLs.    Time  6    Period  Weeks    Status  New    Target Date  08/03/17      PT LONG TERM GOAL #2   Title  Pt will have improved cervical AROM  by 20deg or > throughout in order to allow him to drive and put his shoes on with greater ease.     Time  6    Period  Weeks    Status  New    Target Date  08/03/17      PT LONG TERM GOAL #3   Title  Pt will report being able to sleep at least 3 nights/week without awakening due to neck pain in order to maximize recovery.    Time  6    Period  Weeks    Status  New            Plan - 07/30/17 1428    Clinical Impression Statement  Session focus with cervical mobility and education on importance of posture for pain control.  Pt presents with forward head and rolled shoulders.  Added corner stretch and manual to pectoralis Lt>Rt as well as cervical mm to address soft tissue restrictions.  EOS pt reports pain reduced to 2/10.    Rehab Potential  Fair    PT Frequency  2x / week    PT Duration  6 weeks    PT Treatment/Interventions  ADLs/Self Care Home Management;Cryotherapy;Electrical Stimulation;Moist Heat;Traction;Therapeutic activities;Therapeutic exercise;Neuromuscular re-education;Patient/family education;Manual techniques;Passive range of motion;Dry needling;Energy conservation;Taping    PT Next Visit Plan  Continue with manual for soft tissue restrictions; continue stretching for soft tissue restrictions, postural education and strenghtening    PT Home Exercise Plan  eval: seated cervical retractions; 12/20: UT and LS stretching       Patient will benefit from skilled therapeutic intervention in order to improve the following deficits and impairments:  Decreased  activity tolerance, Decreased endurance, Decreased range of motion, Hypomobility, Increased fascial restricitons, Increased muscle spasms, Impaired flexibility, Improper body mechanics, Postural dysfunction, Pain  Visit  Diagnosis: Cervicalgia  Abnormal posture  Other symptoms and signs involving the musculoskeletal system     Problem List Patient Active Problem List   Diagnosis Date Noted  . Hyperlipidemia 01/08/2016  . Type 2 diabetes mellitus with hyperglycemia ([place]) 01/08/2016  . Nicotine dependence 01/08/2016  . ED (erectile dysfunction) of organic origin 06/21/2013  . Cervicogenic headache 03/03/2013  . Cervico-occipital neuralgia 03/03/2013  . Cervical osteoarthritis 08/09/2012  . Cervical pain 11/27/2011   Ihor Austin, [place]; [place]  Aldona Lento 07/30/2017, 2:35 PM  [facility] [address] [place], [place], [address] Phone: [phone]   Fax:  [phone]  Name: Darrell Terry MRN: [id number] Date of Birth: 1966-05-31

## 2017-07-31 ENCOUNTER — Encounter (HOSPITAL_COMMUNITY): Payer: Self-pay

## 2017-08-03 ENCOUNTER — Encounter (HOSPITAL_COMMUNITY): Payer: Medicare Other

## 2017-08-05 ENCOUNTER — Encounter (HOSPITAL_COMMUNITY): Payer: Medicare Other

## 2017-08-05 DIAGNOSIS — M5412 Radiculopathy, cervical region: Secondary | ICD-10-CM | POA: Diagnosis not present

## 2017-08-05 DIAGNOSIS — E119 Type 2 diabetes mellitus without complications: Secondary | ICD-10-CM | POA: Diagnosis not present

## 2017-08-07 ENCOUNTER — Encounter (HOSPITAL_COMMUNITY): Payer: Medicare Other

## 2017-09-21 ENCOUNTER — Ambulatory Visit (HOSPITAL_COMMUNITY)
Admission: RE | Admit: 2017-09-21 | Discharge: 2017-09-21 | Disposition: A | Discharge status: Home / Self Care | Payer: Medicare Other | Source: Ambulatory Visit | Attending: Internal Medicine | Admitting: Internal Medicine

## 2017-09-21 ENCOUNTER — Other Ambulatory Visit (HOSPITAL_COMMUNITY): Payer: Self-pay | Admitting: Internal Medicine

## 2017-09-21 DIAGNOSIS — E1165 Type 2 diabetes mellitus with hyperglycemia: Secondary | ICD-10-CM | POA: Diagnosis not present

## 2017-09-21 DIAGNOSIS — R059 Cough, unspecified: Secondary | ICD-10-CM

## 2017-09-21 DIAGNOSIS — R0989 Other specified symptoms and signs involving the circulatory and respiratory systems: Secondary | ICD-10-CM

## 2017-09-21 DIAGNOSIS — R918 Other nonspecific abnormal finding of lung field: Secondary | ICD-10-CM | POA: Diagnosis not present

## 2017-09-21 DIAGNOSIS — J209 Acute bronchitis, unspecified: Secondary | ICD-10-CM | POA: Diagnosis not present

## 2017-09-21 DIAGNOSIS — R05 Cough: Secondary | ICD-10-CM | POA: Diagnosis not present

## 2017-09-21 DIAGNOSIS — M069 Rheumatoid arthritis, unspecified: Secondary | ICD-10-CM | POA: Diagnosis not present

## 2017-09-22 ENCOUNTER — Other Ambulatory Visit (HOSPITAL_COMMUNITY): Payer: Self-pay | Admitting: Internal Medicine

## 2017-09-22 DIAGNOSIS — R9389 Abnormal findings on diagnostic imaging of other specified body structures: Secondary | ICD-10-CM

## 2017-09-28 ENCOUNTER — Ambulatory Visit (HOSPITAL_COMMUNITY)
Admission: RE | Admit: 2017-09-28 | Discharge: 2017-09-28 | Disposition: A | Discharge status: Home / Self Care | Payer: Medicare Other | Source: Ambulatory Visit | Attending: Internal Medicine | Admitting: Internal Medicine

## 2017-09-28 DIAGNOSIS — I700 Atherosclerosis of aorta: Secondary | ICD-10-CM | POA: Insufficient documentation

## 2017-09-28 DIAGNOSIS — R9389 Abnormal findings on diagnostic imaging of other specified body structures: Secondary | ICD-10-CM

## 2017-09-28 DIAGNOSIS — R0602 Shortness of breath: Secondary | ICD-10-CM | POA: Diagnosis not present

## 2017-09-28 DIAGNOSIS — K760 Fatty (change of) liver, not elsewhere classified: Secondary | ICD-10-CM | POA: Insufficient documentation

## 2017-12-30 DIAGNOSIS — M0579 Rheumatoid arthritis with rheumatoid factor of multiple sites without organ or systems involvement: Secondary | ICD-10-CM | POA: Diagnosis not present

## 2017-12-30 DIAGNOSIS — Z79899 Other long term (current) drug therapy: Secondary | ICD-10-CM | POA: Diagnosis not present

## 2018-04-26 DIAGNOSIS — M159 Polyosteoarthritis, unspecified: Secondary | ICD-10-CM | POA: Insufficient documentation

## 2018-04-26 DIAGNOSIS — M0579 Rheumatoid arthritis with rheumatoid factor of multiple sites without organ or systems involvement: Secondary | ICD-10-CM | POA: Diagnosis not present

## 2018-04-26 DIAGNOSIS — Z79899 Other long term (current) drug therapy: Secondary | ICD-10-CM | POA: Diagnosis not present

## 2018-04-26 DIAGNOSIS — M153 Secondary multiple arthritis: Secondary | ICD-10-CM | POA: Diagnosis not present

## 2018-05-07 ENCOUNTER — Encounter (HOSPITAL_COMMUNITY): Payer: Self-pay

## 2018-05-07 NOTE — Therapy (Signed)
[place], [place], [address] Phone: [phone]   Fax:  [phone]  Patient Details  Name: Darrell Terry MRN: [id number] Date of Birth: [birth year] Referring Provider:  No ref. provider found  Encounter Date: 05/07/2018  PHYSICAL THERAPY DISCHARGE SUMMARY  Visits from Start of Care: 5  Current functional level related to goals / functional outcomes: See last note   Remaining deficits: See last note   Education / Equipment: n/a  Plan: Patient agrees to discharge.  Patient goals were not met. Patient is being discharged due to not returning since the last visit.  ?????     Geraldine Solar PT, [place] [address] [place], [place], [address] Phone: [phone]   Fax:  [phone]

## 2018-06-30 DIAGNOSIS — M1990 Unspecified osteoarthritis, unspecified site: Secondary | ICD-10-CM | POA: Diagnosis not present

## 2018-06-30 DIAGNOSIS — M153 Secondary multiple arthritis: Secondary | ICD-10-CM | POA: Diagnosis not present

## 2018-06-30 DIAGNOSIS — M0579 Rheumatoid arthritis with rheumatoid factor of multiple sites without organ or systems involvement: Secondary | ICD-10-CM | POA: Diagnosis not present

## 2018-06-30 DIAGNOSIS — M659 Synovitis and tenosynovitis, unspecified: Secondary | ICD-10-CM | POA: Diagnosis not present

## 2018-06-30 DIAGNOSIS — Z79899 Other long term (current) drug therapy: Secondary | ICD-10-CM | POA: Diagnosis not present

## 2018-07-05 DIAGNOSIS — Z1389 Encounter for screening for other disorder: Secondary | ICD-10-CM | POA: Diagnosis not present

## 2018-07-05 DIAGNOSIS — Z23 Encounter for immunization: Secondary | ICD-10-CM | POA: Diagnosis not present

## 2018-07-05 DIAGNOSIS — M5030 Other cervical disc degeneration, unspecified cervical region: Secondary | ICD-10-CM | POA: Diagnosis not present

## 2018-07-05 DIAGNOSIS — Z79899 Other long term (current) drug therapy: Secondary | ICD-10-CM | POA: Diagnosis not present

## 2018-07-05 DIAGNOSIS — Z1331 Encounter for screening for depression: Secondary | ICD-10-CM | POA: Diagnosis not present

## 2018-07-05 DIAGNOSIS — R7309 Other abnormal glucose: Secondary | ICD-10-CM | POA: Diagnosis not present

## 2018-07-05 DIAGNOSIS — I10 Essential (primary) hypertension: Secondary | ICD-10-CM | POA: Diagnosis not present

## 2018-07-05 DIAGNOSIS — R945 Abnormal results of liver function studies: Secondary | ICD-10-CM | POA: Diagnosis not present

## 2018-07-05 DIAGNOSIS — E1165 Type 2 diabetes mellitus with hyperglycemia: Secondary | ICD-10-CM | POA: Diagnosis not present

## 2018-07-05 DIAGNOSIS — M069 Rheumatoid arthritis, unspecified: Secondary | ICD-10-CM | POA: Diagnosis not present

## 2018-08-09 DIAGNOSIS — E1165 Type 2 diabetes mellitus with hyperglycemia: Secondary | ICD-10-CM | POA: Diagnosis not present

## 2018-08-09 DIAGNOSIS — R7309 Other abnormal glucose: Secondary | ICD-10-CM | POA: Diagnosis not present

## 2018-08-09 DIAGNOSIS — F17210 Nicotine dependence, cigarettes, uncomplicated: Secondary | ICD-10-CM | POA: Diagnosis not present

## 2018-08-09 DIAGNOSIS — M545 Low back pain: Secondary | ICD-10-CM | POA: Diagnosis not present

## 2018-08-09 DIAGNOSIS — M069 Rheumatoid arthritis, unspecified: Secondary | ICD-10-CM | POA: Diagnosis not present

## 2018-08-09 DIAGNOSIS — R945 Abnormal results of liver function studies: Secondary | ICD-10-CM | POA: Diagnosis not present

## 2018-08-20 DIAGNOSIS — R945 Abnormal results of liver function studies: Secondary | ICD-10-CM | POA: Diagnosis not present

## 2018-08-20 DIAGNOSIS — Z79899 Other long term (current) drug therapy: Secondary | ICD-10-CM | POA: Diagnosis not present

## 2018-11-01 DIAGNOSIS — Z79899 Other long term (current) drug therapy: Secondary | ICD-10-CM | POA: Diagnosis not present

## 2018-11-01 DIAGNOSIS — M069 Rheumatoid arthritis, unspecified: Secondary | ICD-10-CM | POA: Diagnosis not present

## 2018-11-01 DIAGNOSIS — R945 Abnormal results of liver function studies: Secondary | ICD-10-CM | POA: Diagnosis not present

## 2018-11-01 DIAGNOSIS — M1990 Unspecified osteoarthritis, unspecified site: Secondary | ICD-10-CM | POA: Diagnosis not present

## 2018-11-01 DIAGNOSIS — M0579 Rheumatoid arthritis with rheumatoid factor of multiple sites without organ or systems involvement: Secondary | ICD-10-CM | POA: Diagnosis not present

## 2018-11-01 DIAGNOSIS — E119 Type 2 diabetes mellitus without complications: Secondary | ICD-10-CM | POA: Diagnosis not present

## 2018-11-01 DIAGNOSIS — E785 Hyperlipidemia, unspecified: Secondary | ICD-10-CM | POA: Diagnosis not present

## 2019-01-31 DIAGNOSIS — R7989 Other specified abnormal findings of blood chemistry: Secondary | ICD-10-CM | POA: Insufficient documentation

## 2019-01-31 DIAGNOSIS — M153 Secondary multiple arthritis: Secondary | ICD-10-CM | POA: Diagnosis not present

## 2019-01-31 DIAGNOSIS — M0579 Rheumatoid arthritis with rheumatoid factor of multiple sites without organ or systems involvement: Secondary | ICD-10-CM | POA: Diagnosis not present

## 2019-01-31 DIAGNOSIS — R945 Abnormal results of liver function studies: Secondary | ICD-10-CM | POA: Diagnosis not present

## 2019-02-14 DIAGNOSIS — M069 Rheumatoid arthritis, unspecified: Secondary | ICD-10-CM | POA: Diagnosis not present

## 2019-02-14 DIAGNOSIS — E160 Drug-induced hypoglycemia without coma: Secondary | ICD-10-CM | POA: Diagnosis not present

## 2019-02-14 DIAGNOSIS — E1165 Type 2 diabetes mellitus with hyperglycemia: Secondary | ICD-10-CM | POA: Diagnosis not present

## 2019-02-14 DIAGNOSIS — E785 Hyperlipidemia, unspecified: Secondary | ICD-10-CM | POA: Diagnosis not present

## 2019-02-14 DIAGNOSIS — I10 Essential (primary) hypertension: Secondary | ICD-10-CM | POA: Diagnosis not present

## 2019-02-14 DIAGNOSIS — F17210 Nicotine dependence, cigarettes, uncomplicated: Secondary | ICD-10-CM | POA: Diagnosis not present

## 2019-03-17 DIAGNOSIS — E1165 Type 2 diabetes mellitus with hyperglycemia: Secondary | ICD-10-CM | POA: Diagnosis not present

## 2019-03-17 DIAGNOSIS — E785 Hyperlipidemia, unspecified: Secondary | ICD-10-CM | POA: Diagnosis not present

## 2019-04-17 DIAGNOSIS — I10 Essential (primary) hypertension: Secondary | ICD-10-CM | POA: Diagnosis not present

## 2019-04-17 DIAGNOSIS — E1165 Type 2 diabetes mellitus with hyperglycemia: Secondary | ICD-10-CM | POA: Diagnosis not present

## 2019-05-17 DIAGNOSIS — I10 Essential (primary) hypertension: Secondary | ICD-10-CM | POA: Diagnosis not present

## 2019-05-17 DIAGNOSIS — E1165 Type 2 diabetes mellitus with hyperglycemia: Secondary | ICD-10-CM | POA: Diagnosis not present

## 2019-06-03 DIAGNOSIS — M153 Secondary multiple arthritis: Secondary | ICD-10-CM | POA: Diagnosis not present

## 2019-06-03 DIAGNOSIS — Z5181 Encounter for therapeutic drug level monitoring: Secondary | ICD-10-CM | POA: Diagnosis not present

## 2019-06-03 DIAGNOSIS — R945 Abnormal results of liver function studies: Secondary | ICD-10-CM | POA: Diagnosis not present

## 2019-06-03 DIAGNOSIS — Z79899 Other long term (current) drug therapy: Secondary | ICD-10-CM | POA: Diagnosis not present

## 2019-06-03 DIAGNOSIS — R7989 Other specified abnormal findings of blood chemistry: Secondary | ICD-10-CM | POA: Diagnosis not present

## 2019-06-03 DIAGNOSIS — Z23 Encounter for immunization: Secondary | ICD-10-CM | POA: Diagnosis not present

## 2019-06-03 DIAGNOSIS — M0579 Rheumatoid arthritis with rheumatoid factor of multiple sites without organ or systems involvement: Secondary | ICD-10-CM | POA: Diagnosis not present

## 2019-06-08 DIAGNOSIS — H02835 Dermatochalasis of left lower eyelid: Secondary | ICD-10-CM | POA: Diagnosis not present

## 2019-06-08 DIAGNOSIS — H02832 Dermatochalasis of right lower eyelid: Secondary | ICD-10-CM | POA: Diagnosis not present

## 2019-06-08 DIAGNOSIS — Z7984 Long term (current) use of oral hypoglycemic drugs: Secondary | ICD-10-CM | POA: Diagnosis not present

## 2019-06-08 DIAGNOSIS — H2513 Age-related nuclear cataract, bilateral: Secondary | ICD-10-CM | POA: Diagnosis not present

## 2019-06-08 DIAGNOSIS — H524 Presbyopia: Secondary | ICD-10-CM | POA: Diagnosis not present

## 2019-06-08 DIAGNOSIS — H02834 Dermatochalasis of left upper eyelid: Secondary | ICD-10-CM | POA: Diagnosis not present

## 2019-06-08 DIAGNOSIS — E119 Type 2 diabetes mellitus without complications: Secondary | ICD-10-CM | POA: Diagnosis not present

## 2019-06-08 DIAGNOSIS — H5203 Hypermetropia, bilateral: Secondary | ICD-10-CM | POA: Diagnosis not present

## 2019-06-08 DIAGNOSIS — H02831 Dermatochalasis of right upper eyelid: Secondary | ICD-10-CM | POA: Diagnosis not present

## 2019-06-08 DIAGNOSIS — H52203 Unspecified astigmatism, bilateral: Secondary | ICD-10-CM | POA: Diagnosis not present

## 2019-06-08 DIAGNOSIS — Z79899 Other long term (current) drug therapy: Secondary | ICD-10-CM | POA: Diagnosis not present

## 2019-07-18 DIAGNOSIS — Z1331 Encounter for screening for depression: Secondary | ICD-10-CM | POA: Diagnosis not present

## 2019-07-18 DIAGNOSIS — F17210 Nicotine dependence, cigarettes, uncomplicated: Secondary | ICD-10-CM | POA: Diagnosis not present

## 2019-07-18 DIAGNOSIS — Z1389 Encounter for screening for other disorder: Secondary | ICD-10-CM | POA: Diagnosis not present

## 2019-07-18 DIAGNOSIS — E1165 Type 2 diabetes mellitus with hyperglycemia: Secondary | ICD-10-CM | POA: Diagnosis not present

## 2019-07-18 DIAGNOSIS — I10 Essential (primary) hypertension: Secondary | ICD-10-CM | POA: Diagnosis not present

## 2019-07-18 DIAGNOSIS — E785 Hyperlipidemia, unspecified: Secondary | ICD-10-CM | POA: Diagnosis not present

## 2019-07-20 ENCOUNTER — Other Ambulatory Visit (HOSPITAL_COMMUNITY): Payer: Self-pay | Admitting: Internal Medicine

## 2019-07-20 ENCOUNTER — Other Ambulatory Visit: Payer: Self-pay | Admitting: Internal Medicine

## 2019-07-20 DIAGNOSIS — Z87891 Personal history of nicotine dependence: Secondary | ICD-10-CM

## 2019-07-20 DIAGNOSIS — Z122 Encounter for screening for malignant neoplasm of respiratory organs: Secondary | ICD-10-CM

## 2019-07-29 ENCOUNTER — Other Ambulatory Visit: Payer: Self-pay

## 2019-07-29 ENCOUNTER — Encounter (HOSPITAL_COMMUNITY): Payer: Self-pay | Admitting: Emergency Medicine

## 2019-07-29 ENCOUNTER — Emergency Department (HOSPITAL_COMMUNITY)
Admission: EM | Admit: 2019-07-29 | Discharge: 2019-07-29 | Disposition: A | Discharge status: Home / Self Care | Payer: Medicare Other | Attending: Emergency Medicine | Admitting: Emergency Medicine

## 2019-07-29 ENCOUNTER — Emergency Department (HOSPITAL_COMMUNITY): Payer: Medicare Other

## 2019-07-29 DIAGNOSIS — F17210 Nicotine dependence, cigarettes, uncomplicated: Secondary | ICD-10-CM | POA: Diagnosis not present

## 2019-07-29 DIAGNOSIS — R519 Headache, unspecified: Secondary | ICD-10-CM | POA: Diagnosis not present

## 2019-07-29 DIAGNOSIS — E119 Type 2 diabetes mellitus without complications: Secondary | ICD-10-CM | POA: Diagnosis not present

## 2019-07-29 DIAGNOSIS — Z7984 Long term (current) use of oral hypoglycemic drugs: Secondary | ICD-10-CM | POA: Diagnosis not present

## 2019-07-29 DIAGNOSIS — R05 Cough: Secondary | ICD-10-CM | POA: Diagnosis not present

## 2019-07-29 DIAGNOSIS — Z20822 Contact with and (suspected) exposure to covid-19: Secondary | ICD-10-CM | POA: Insufficient documentation

## 2019-07-29 DIAGNOSIS — R0602 Shortness of breath: Secondary | ICD-10-CM | POA: Insufficient documentation

## 2019-07-29 DIAGNOSIS — Z79899 Other long term (current) drug therapy: Secondary | ICD-10-CM | POA: Diagnosis not present

## 2019-07-29 LAB — POC SARS CORONAVIRUS 2 AG -  ED: SARS Coronavirus 2 Ag: NEGATIVE

## 2019-07-29 MED ORDER — DEXAMETHASONE SODIUM PHOSPHATE 10 MG/ML IJ SOLN
10.0000 mg | Freq: Once | INTRAMUSCULAR | Status: AC
  Administered 2019-07-29: 05:00:00 10 mg via INTRAMUSCULAR
  Filled 2019-07-29: qty 1

## 2019-07-29 MED ORDER — ALBUTEROL SULFATE HFA 108 (90 BASE) MCG/ACT IN AERS
2.0000 | INHALATION_SPRAY | Freq: Once | RESPIRATORY_TRACT | Status: AC
  Administered 2019-07-29: 2 via RESPIRATORY_TRACT
  Filled 2019-07-29: qty 6.7

## 2019-07-29 NOTE — ED Triage Notes (Signed)
Pt states he started feeling bad on Monday, has had cough, HA, and shob. Pt states that he woke up this morning with worseing shob and chills. Pt exposed to COVID positive person on 12/23 and 12/25.

## 2019-07-29 NOTE — ED Provider Notes (Signed)
[facility] EMERGENCY DEPARTMENT Provider Note   CSN: [id number] Arrival date & time: 07/29/19  0402     History Chief Complaint  Patient presents with  . Shortness of Breath    COVID exposure    Darrell Terry is a 54 y.o. male.  HPI     This a 54 year old male with a history of diabetes and rheumatoid arthritis who presents with cough, shortness of breath, chills.  Patient reports he woke up this morning in a sweat.  He states that he felt chilled and short of breath.  He started having some shortness of breath and cough on Monday.  He also believes he is losing his sense of taste.  He was exposed to known Covid positive patient on December 23 and December 25.  He is not having any chest pain, nausea, vomiting, abdominal pain.  He has not taken his temperature so he does not know whether he has been truly febrile.  Past Medical History:  Diagnosis Date  . Depression   . Diabetes mellitus without complication ([place])   . Rheumatoid arthritis [facility])     Patient Active Problem List   Diagnosis Date Noted  . Hyperlipidemia 01/08/2016  . Type 2 diabetes mellitus with hyperglycemia ([place]) 01/08/2016  . Nicotine dependence 01/08/2016  . ED (erectile dysfunction) of organic origin 06/21/2013  . Cervicogenic headache 03/03/2013  . Cervico-occipital neuralgia 03/03/2013  . Cervical osteoarthritis 08/09/2012  . Cervical pain 11/27/2011    Past Surgical History:  Procedure Laterality Date  . CERVICAL SPINE SURGERY     X 3  . COLONOSCOPY N/A 03/26/2015   Procedure: COLONOSCOPY;  Surgeon: Daneil Dolin, MD;  Location: [facility] END[facility];  Service: Endoscopy;  Laterality: N/A;  8:00 AM  . TONSILLECTOMY         Family History  Problem Relation Age of Onset  . Stroke Other        undocumented which relative   . High Cholesterol Other        undocumented which relative   . Heart disease Other        undocumented which relative   . Cancer - Other Other        undocumented which  relative     Social History   Tobacco Use  . Smoking status: Current Some Day Smoker    Packs/day: 1.00    Years: 34.00    Pack years: 34.00    Types: Cigarettes  . Smokeless tobacco: Never Used  Substance Use Topics  . Alcohol use: No    Alcohol/week: 0.0 standard drinks  . Drug use: Yes    Types: Marijuana    Comment: 1-2 times/day- denies    Home Medications Prior to Admission medications   Medication Sig Start Date End Date Taking? Authorizing Provider  folic acid (FOLVITE) Q000111Q MCG tablet Take 800 mcg by mouth daily.     [provider]  metFORMIN (GLUCOPHAGE) 500 MG tablet Take 500 mg by mouth 2 (two) times daily. 07/03/15   [provider]  Omega-3 Fatty Acids (FISH OIL) 1000 MG CAPS Take 5,000 mg by mouth daily.     [provider]  omeprazole (PRILOSEC) 20 MG capsule Take 20 mg by mouth daily.    [provider]    Allergies    Patient has no known allergies.  Review of Systems   Review of Systems  Constitutional: Positive for chills and diaphoresis. Negative for fever.  Respiratory: Positive for cough and shortness  of breath.   Cardiovascular: Negative for chest pain.  Gastrointestinal: Negative for abdominal pain, nausea and vomiting.  Genitourinary: Negative for dysuria.  Neurological: Positive for headaches.  All other systems reviewed and are negative.   Physical Exam Updated Vital Signs BP (!) 147/92 (BP Location: Left Arm)   Pulse 81   Resp 18   Ht 1.829 m (6')   Wt 97.5 kg   SpO2 99%   BMI 29.16 kg/m   Physical Exam Vitals and nursing note reviewed.  Constitutional:      Appearance: He is well-developed. He is not ill-appearing.  HENT:     Head: Normocephalic and atraumatic.  Eyes:     Pupils: Pupils are equal, round, and reactive to light.  Cardiovascular:     Rate and Rhythm: Normal rate and regular rhythm.     Heart sounds: Normal heart sounds. No murmur.  Pulmonary:     Effort: Pulmonary effort is  normal. No respiratory distress.     Breath sounds: Wheezing present.     Comments: Occasional wheeze, fair air movement Abdominal:     General: Bowel sounds are normal.     Palpations: Abdomen is soft.     Tenderness: There is no abdominal tenderness.  Musculoskeletal:     Cervical back: Neck supple.     Right lower leg: No edema.     Left lower leg: No edema.  Lymphadenopathy:     Cervical: No cervical adenopathy.  Skin:    General: Skin is warm and dry.  Neurological:     Mental Status: He is alert and oriented to person, place, and time.  Psychiatric:        Mood and Affect: Mood normal.     ED Results / Procedures / Treatments   Labs (all labs ordered are listed, but only abnormal results are displayed) Labs Reviewed  NOVEL CORONAVIRUS, NAA (HOSP ORDER, SEND-OUT TO REF LAB; TAT 18-24 HRS)  POC SARS CORONAVIRUS 2 AG -  ED    EKG EKG Interpretation  Date/Time:  Friday July 29 2019 05:21:27 EST Ventricular Rate:  79 PR Interval:    QRS Duration: 109 QT Interval:  382 QTC Calculation: 438 R Axis:   80 Text Interpretation: Sinus rhythm Confirmed by Thayer Jew [phone]) on 07/29/2019 5:33:11 AM   Radiology DG Chest Portable 1 View  Result Date: 07/29/2019 CLINICAL DATA:  Shortness of breath. Cough. Headache. EXAM: PORTABLE CHEST 1 VIEW COMPARISON:  09/21/2017, CT 09/28/2017 FINDINGS: The cardiomediastinal contours are normal. The lungs are clear. Pulmonary vasculature is normal. No consolidation, pleural effusion, or pneumothorax. No acute osseous abnormalities are seen. Calcification projecting over the right upper lung represents a bone island. IMPRESSION: No acute abnormality. Electronically Signed   By: Keith Rake M.D.   On: 07/29/2019 05:24    Procedures Procedures (including critical care time)  Medications Ordered in ED Medications  albuterol (VENTOLIN HFA) 108 (90 Base) MCG/ACT inhaler 2 puff (2 puffs Inhalation Given 07/29/19 0522)  dexamethasone  (DECADRON) injection 10 mg (10 mg Intramuscular Given 07/29/19 0522)    ED Course  I have reviewed the triage vital signs and the nursing notes.  Pertinent labs & imaging results that were available during my care of the patient were reviewed by me and considered in my medical decision making (see chart for details).    MDM Rules/Calculators/A&P                       Patient presents  with upper respiratory viral symptoms.  Known Covid exposure.  He is overall nontoxic-appearing and vital signs are reassuring.  He has some scant wheezing on exam but good air movement.  He is a current smoker.  He was given an inhaler and 1 dose of Decadron.  Chest x-ray shows no evidence of pneumonia.  EKG is nonischemic and without arrhythmia.  Initial point-of-care Covid testing is negative.  However, given his sick contact and symptoms, would have high suspicion for COVID-19.  Repeat testing was sent.  Patient was instructed on supportive measures at home including portable pulse oximetry and inhaler as needed.  He was given strict return precautions.  He was instructed to quarantine for presumed COVID-19 infection.  Patient stated understanding.  After history, exam, and medical workup I feel the patient has been appropriately medically screened and is safe for discharge home. Pertinent diagnoses were discussed with the patient. Patient was given return precautions.  Amador Cunas was evaluated in Emergency Department on 07/29/2019 for the symptoms described in the history of present illness. He was evaluated in the context of the global COVID-19 pandemic, which necessitated consideration that the patient might be at risk for infection with the SARS-CoV-2 virus that causes COVID-19. Institutional protocols and algorithms that pertain to the evaluation of patients at risk for COVID-19 are in a state of rapid change based on information released by regulatory bodies including the CDC and federal and state  organizations. These policies and algorithms were followed during the patient's care in the ED.   Final Clinical Impression(s) / ED Diagnoses Final diagnoses:  Suspected COVID-19 virus infection    Rx / DC Orders ED Discharge Orders    None       Evynn Boutelle, Barbette Hair, MD 07/29/19 [phone]

## 2019-07-29 NOTE — ED Notes (Signed)
Pt ambulatory to waiting room. Pt verbalized understanding of discharge instructions.   

## 2019-07-29 NOTE — Discharge Instructions (Addendum)
You were seen today for upper respiratory symptoms and shortness of breath.  Given your exposure, this is highly suspicious for COVID-19.  Your initial test was negative however, repeat testing has been sent and is pending.  You need to quarantine given your exposure.  Use your inhaler as needed.  If you continue to have shortness of breath, obtain a portable pulse oximeter from your local drugstore.  If your O2 sats dropped below 93%, you need to be reevaluated.  If anything changes or worsens you should be reevaluated.

## 2019-07-30 LAB — NOVEL CORONAVIRUS, NAA (HOSP ORDER, SEND-OUT TO REF LAB; TAT 18-24 HRS): SARS-CoV-2, NAA: NOT DETECTED

## 2019-08-11 ENCOUNTER — Other Ambulatory Visit: Payer: Self-pay

## 2019-08-11 ENCOUNTER — Ambulatory Visit (HOSPITAL_COMMUNITY)
Admission: RE | Admit: 2019-08-11 | Discharge: 2019-08-11 | Disposition: A | Discharge status: Home / Self Care | Payer: Medicare Other | Source: Ambulatory Visit | Attending: Internal Medicine | Admitting: Internal Medicine

## 2019-08-11 DIAGNOSIS — Z87891 Personal history of nicotine dependence: Secondary | ICD-10-CM | POA: Diagnosis not present

## 2019-08-11 DIAGNOSIS — Z122 Encounter for screening for malignant neoplasm of respiratory organs: Secondary | ICD-10-CM | POA: Insufficient documentation

## 2019-08-11 DIAGNOSIS — F17210 Nicotine dependence, cigarettes, uncomplicated: Secondary | ICD-10-CM | POA: Diagnosis not present

## 2019-08-15 DIAGNOSIS — M069 Rheumatoid arthritis, unspecified: Secondary | ICD-10-CM | POA: Diagnosis not present

## 2019-08-15 DIAGNOSIS — I10 Essential (primary) hypertension: Secondary | ICD-10-CM | POA: Diagnosis not present

## 2019-08-15 DIAGNOSIS — R079 Chest pain, unspecified: Secondary | ICD-10-CM | POA: Diagnosis not present

## 2019-08-15 DIAGNOSIS — E119 Type 2 diabetes mellitus without complications: Secondary | ICD-10-CM | POA: Diagnosis not present

## 2019-09-07 DIAGNOSIS — E119 Type 2 diabetes mellitus without complications: Secondary | ICD-10-CM | POA: Diagnosis not present

## 2019-09-07 DIAGNOSIS — R590 Localized enlarged lymph nodes: Secondary | ICD-10-CM | POA: Diagnosis not present

## 2019-09-07 DIAGNOSIS — I10 Essential (primary) hypertension: Secondary | ICD-10-CM | POA: Diagnosis not present

## 2019-09-07 DIAGNOSIS — M069 Rheumatoid arthritis, unspecified: Secondary | ICD-10-CM | POA: Diagnosis not present

## 2019-09-09 ENCOUNTER — Other Ambulatory Visit: Payer: Self-pay

## 2019-09-09 ENCOUNTER — Ambulatory Visit (INDEPENDENT_AMBULATORY_CARE_PROVIDER_SITE_OTHER): Payer: Medicare Other | Admitting: Internal Medicine

## 2019-09-09 ENCOUNTER — Encounter: Payer: Self-pay | Admitting: Internal Medicine

## 2019-09-09 VITALS — BP 150/90 | HR 91 | Temp 98.6°F | Ht 72.0 in | Wt 212.0 lb

## 2019-09-09 DIAGNOSIS — I10 Essential (primary) hypertension: Secondary | ICD-10-CM

## 2019-09-09 DIAGNOSIS — R221 Localized swelling, mass and lump, neck: Secondary | ICD-10-CM | POA: Insufficient documentation

## 2019-09-09 DIAGNOSIS — Z720 Tobacco use: Secondary | ICD-10-CM | POA: Insufficient documentation

## 2019-09-09 DIAGNOSIS — R079 Chest pain, unspecified: Secondary | ICD-10-CM

## 2019-09-09 DIAGNOSIS — E782 Mixed hyperlipidemia: Secondary | ICD-10-CM

## 2019-09-09 DIAGNOSIS — F17200 Nicotine dependence, unspecified, uncomplicated: Secondary | ICD-10-CM | POA: Diagnosis not present

## 2019-09-09 NOTE — Progress Notes (Signed)
Cardiology Office Note   Date:  09/09/2019   ID:  Darrell Terry, DOB 03/03/1966, MRN [id number]  PCP:  Rosita Fire, MD  Cardiologist:   Dorris Carnes, MD   Pt referred by Dr Legrand Rams for chest discomfort     History of Present Illness: Darrell Terry is a 54 y.o. male with a history of fluttering in chest   Occur about 2 x per month  Not associated with dizziness  Last seconds    The pt also notes occasional chest pressure.  Pressure is worse with lying down   Better with sitting up   Denies reflux.   Rarely with acitvity     The pt has RA   This limits his activity.  Doesn't do a lot of walking    The patient says he more troubled by swelling in L neck   He says he had it last year a couple times   Swollen  Tender.  Would improve  Then come back   Currently this begain abut 1 month ago  No drainage.   Hurts in ear.  No problems swallowing  No cough   Wt stable  Does not have an appt yet to get evaluated    Pt was seen in [facility] ED on 1/1/ with SOB  Concerned about COVID  He was neg  Symptoms resolved      Current Meds  Medication Sig  . folic acid (FOLVITE) Q000111Q MCG tablet Take 800 mcg by mouth daily.   . hydroxychloroquine (PLAQUENIL) 200 MG tablet Take 200 mg by mouth 2 (two) times daily.  . metFORMIN (GLUCOPHAGE) 500 MG tablet Take 500 mg by mouth 2 (two) times daily.  . Omega-3 Fatty Acids (FISH OIL) 1000 MG CAPS Take 5,000 mg by mouth daily.   Marland Kitchen omeprazole (PRILOSEC) 20 MG capsule Take 20 mg by mouth daily.     Allergies:   Patient has no known allergies.   Past Medical History:  Diagnosis Date  . Depression   . Diabetes mellitus without complication ([place])   . Rheumatoid arthritis [facility])     Past Surgical History:  Procedure Laterality Date  . CERVICAL SPINE SURGERY     X 3  . COLONOSCOPY N/A 03/26/2015   Procedure: COLONOSCOPY;  Surgeon: Daneil Dolin, MD;  Location: [facility];  Service: Endoscopy;  Laterality: N/A;  8:00 AM  . TONSILLECTOMY        Social History:  The patient  reports that he has been smoking cigarettes. He has a 17.00 pack-year smoking history. He has never used smokeless tobacco. He reports current drug use. Drug: Marijuana. He reports that he does not drink alcohol.   Family History:  The patient's family history includes Cancer - Other in an other family member; Heart disease in an other family member; High Cholesterol in an other family member; Stroke in an other family member.    ROS:  Please see the history of present illness. All other systems are reviewed and  Negative to the above problem except as noted.    PHYSICAL EXAM: VS:  BP (!) 150/90   Pulse 91   Temp 98.6 F (37 C)   Ht 6' (1.829 m)   Wt 212 lb (96.2 kg)   SpO2 98%   BMI 28.75 kg/m   GEN: Well nourished, well developed, in no acute distress  HEENT: normal  Neck: no JVD, carotid bruits  L neck   Swollen, mild reddening of skin.  Very tender   Could not do full exam   Cardiac: RRR; no murmurs, rubs, or gallops,no edema  Respiratory:  clear to auscultation bilaterally, normal work of breathing GI: soft, nontender, nondistended, + BS  No hepatomegaly  MS: no deformity Moving all extremities   Skin: warm and dry, no rash Neuro:  Strength and sensation are intact Psych: euthymic mood, full affect   EKG:  EKG is not ordered today.  On 07/29/19:  SR 79 bpm     Lipid Panel No results found for: CHOL, TRIG, HDL, CHOLHDL, VLDL, LDLCALC, LDLDIRECT    Wt Readings from Last 3 Encounters:  09/09/19 212 lb (96.2 kg)  07/29/19 215 lb (97.5 kg)  01/10/16 204 lb (92.5 kg)      ASSESSMENT AND PLAN:  1  Neck mass    I have set up an appt for him to be seen today at [facility] ENT by Dr Constance Holster     2  Chest discomfort   This does not sound cardiac    Worse with laying down  ? GI   He was on omepraZole in past   I encouraged him to restart at night   I dont think this will effect his liver function    I would not plan any further testing   3   Fluttering   Spells are transient and infrequent   I am not convinced of signif arrhythmia  4  CAD   Pt has mild atherosclerosis of RCA   AGain, I do not think his current symptoms are cardiac   Will get liipds from Dr Josephine Cables office   Rx risk factoers  5  PVOD   Aorta measures 40 mm  Minimally dilated  Will need periodic f/u  6  HTN  BP is up some today   Pt is in pain though  Follow   7  HL   Will get lipids from Dr Legrand Rams  Had been on statin   Held when he had liver enzyme changes   Those may have been due to MTX which he was on in past    Will review    F/u after I review tests   Current medicines are reviewed at length with the patient today.  The patient does not have concerns regarding medicines.  Signed, Dorris Carnes, MD  09/09/2019 11:08 AM    [address], [place], [place]  [address] Phone: [phone]; Fax: [phone]

## 2019-09-09 NOTE — Patient Instructions (Signed)
Medication Instructions: Your physician recommends that you continue on your current medications as directed. Please refer to the Current Medication list given to you today.   Labwork: We have requested labs from Dr.Fanta  Procedures/Testing: None  Follow-Up: To be determined after Dr.Ross reviews your lab work.  Any Additional Special Instructions Will Be Listed Below (If Applicable).  Dr.Ross has scheduled you an apt this afternoon with ENT in [facility]   If you need a refill on your cardiac medications before your next appointment, please call your pharmacy.      Thank you for choosing [facility] !

## 2019-09-16 ENCOUNTER — Other Ambulatory Visit: Payer: Self-pay | Admitting: Otolaryngology

## 2019-09-16 DIAGNOSIS — F17200 Nicotine dependence, unspecified, uncomplicated: Secondary | ICD-10-CM

## 2019-09-16 DIAGNOSIS — R221 Localized swelling, mass and lump, neck: Secondary | ICD-10-CM

## 2019-09-21 ENCOUNTER — Ambulatory Visit
Admission: RE | Admit: 2019-09-21 | Discharge: 2019-09-21 | Disposition: A | Discharge status: Home / Self Care | Payer: Medicare Other | Source: Ambulatory Visit | Attending: Otolaryngology | Admitting: Otolaryngology

## 2019-09-21 ENCOUNTER — Other Ambulatory Visit: Payer: Self-pay

## 2019-09-21 DIAGNOSIS — R221 Localized swelling, mass and lump, neck: Secondary | ICD-10-CM | POA: Diagnosis not present

## 2019-09-21 DIAGNOSIS — K1329 Other disturbances of oral epithelium, including tongue: Secondary | ICD-10-CM | POA: Diagnosis not present

## 2019-09-21 DIAGNOSIS — F17200 Nicotine dependence, unspecified, uncomplicated: Secondary | ICD-10-CM

## 2019-09-21 MED ORDER — IOPAMIDOL (ISOVUE-300) INJECTION 61%
75.0000 mL | Freq: Once | INTRAVENOUS | Status: AC | PRN
  Administered 2019-09-21: 75 mL via INTRAVENOUS

## 2019-09-28 DIAGNOSIS — R221 Localized swelling, mass and lump, neck: Secondary | ICD-10-CM | POA: Diagnosis not present

## 2019-09-28 DIAGNOSIS — L928 Other granulomatous disorders of the skin and subcutaneous tissue: Secondary | ICD-10-CM | POA: Diagnosis not present

## 2019-09-28 DIAGNOSIS — L929 Granulomatous disorder of the skin and subcutaneous tissue, unspecified: Secondary | ICD-10-CM | POA: Diagnosis not present

## 2019-10-13 ENCOUNTER — Other Ambulatory Visit: Payer: Self-pay | Admitting: Otolaryngology

## 2019-10-13 DIAGNOSIS — R221 Localized swelling, mass and lump, neck: Secondary | ICD-10-CM

## 2019-10-20 DIAGNOSIS — Z23 Encounter for immunization: Secondary | ICD-10-CM | POA: Diagnosis not present

## 2019-11-02 DIAGNOSIS — R945 Abnormal results of liver function studies: Secondary | ICD-10-CM | POA: Diagnosis not present

## 2019-11-02 DIAGNOSIS — M153 Secondary multiple arthritis: Secondary | ICD-10-CM | POA: Diagnosis not present

## 2019-11-02 DIAGNOSIS — Z13220 Encounter for screening for lipoid disorders: Secondary | ICD-10-CM | POA: Diagnosis not present

## 2019-11-02 DIAGNOSIS — R221 Localized swelling, mass and lump, neck: Secondary | ICD-10-CM | POA: Diagnosis not present

## 2019-11-02 DIAGNOSIS — F17210 Nicotine dependence, cigarettes, uncomplicated: Secondary | ICD-10-CM | POA: Diagnosis not present

## 2019-11-02 DIAGNOSIS — Z79899 Other long term (current) drug therapy: Secondary | ICD-10-CM | POA: Diagnosis not present

## 2019-11-02 DIAGNOSIS — M0579 Rheumatoid arthritis with rheumatoid factor of multiple sites without organ or systems involvement: Secondary | ICD-10-CM | POA: Diagnosis not present

## 2019-11-02 DIAGNOSIS — Z5181 Encounter for therapeutic drug level monitoring: Secondary | ICD-10-CM | POA: Diagnosis not present

## 2019-11-02 DIAGNOSIS — K760 Fatty (change of) liver, not elsewhere classified: Secondary | ICD-10-CM | POA: Diagnosis not present

## 2019-11-09 ENCOUNTER — Ambulatory Visit
Admission: RE | Admit: 2019-11-09 | Discharge: 2019-11-09 | Disposition: A | Discharge status: Home / Self Care | Payer: Medicare Other | Source: Ambulatory Visit | Attending: Otolaryngology | Admitting: Otolaryngology

## 2019-11-09 ENCOUNTER — Other Ambulatory Visit: Payer: Self-pay

## 2019-11-09 DIAGNOSIS — R221 Localized swelling, mass and lump, neck: Secondary | ICD-10-CM

## 2019-11-09 MED ORDER — GADOBENATE DIMEGLUMINE 529 MG/ML IV SOLN
20.0000 mL | Freq: Once | INTRAVENOUS | Status: AC | PRN
  Administered 2019-11-09: 20 mL via INTRAVENOUS

## 2019-11-12 DIAGNOSIS — Z23 Encounter for immunization: Secondary | ICD-10-CM | POA: Diagnosis not present

## 2019-11-22 DIAGNOSIS — Z981 Arthrodesis status: Secondary | ICD-10-CM | POA: Diagnosis not present

## 2019-11-22 DIAGNOSIS — R202 Paresthesia of skin: Secondary | ICD-10-CM | POA: Diagnosis not present

## 2019-11-22 DIAGNOSIS — R200 Anesthesia of skin: Secondary | ICD-10-CM | POA: Diagnosis not present

## 2019-11-22 DIAGNOSIS — M069 Rheumatoid arthritis, unspecified: Secondary | ICD-10-CM | POA: Diagnosis not present

## 2019-11-22 DIAGNOSIS — Z79899 Other long term (current) drug therapy: Secondary | ICD-10-CM | POA: Diagnosis not present

## 2019-11-22 DIAGNOSIS — M5031 Other cervical disc degeneration,  high cervical region: Secondary | ICD-10-CM | POA: Diagnosis not present

## 2019-11-22 DIAGNOSIS — M50323 Other cervical disc degeneration at C6-C7 level: Secondary | ICD-10-CM | POA: Diagnosis not present

## 2020-03-19 DIAGNOSIS — Z79899 Other long term (current) drug therapy: Secondary | ICD-10-CM | POA: Diagnosis not present

## 2020-03-19 DIAGNOSIS — M0579 Rheumatoid arthritis with rheumatoid factor of multiple sites without organ or systems involvement: Secondary | ICD-10-CM | POA: Diagnosis not present

## 2020-03-19 DIAGNOSIS — M153 Secondary multiple arthritis: Secondary | ICD-10-CM | POA: Diagnosis not present

## 2020-04-25 ENCOUNTER — Ambulatory Visit (INDEPENDENT_AMBULATORY_CARE_PROVIDER_SITE_OTHER): Payer: Medicare Other | Admitting: Internal Medicine

## 2020-04-25 ENCOUNTER — Encounter: Payer: Self-pay | Admitting: Internal Medicine

## 2020-04-25 ENCOUNTER — Other Ambulatory Visit: Payer: Self-pay

## 2020-04-25 ENCOUNTER — Other Ambulatory Visit (HOSPITAL_COMMUNITY)
Admission: RE | Admit: 2020-04-25 | Discharge: 2020-04-25 | Disposition: A | Discharge status: Home / Self Care | Payer: Medicare Other | Source: Other Acute Inpatient Hospital | Attending: Internal Medicine | Admitting: Internal Medicine

## 2020-04-25 VITALS — BP 146/85 | HR 77 | Temp 97.9°F | Resp 18 | Ht 72.0 in | Wt 203.4 lb

## 2020-04-25 DIAGNOSIS — F17210 Nicotine dependence, cigarettes, uncomplicated: Secondary | ICD-10-CM

## 2020-04-25 DIAGNOSIS — N2889 Other specified disorders of kidney and ureter: Secondary | ICD-10-CM

## 2020-04-25 DIAGNOSIS — E785 Hyperlipidemia, unspecified: Secondary | ICD-10-CM

## 2020-04-25 DIAGNOSIS — K219 Gastro-esophageal reflux disease without esophagitis: Secondary | ICD-10-CM

## 2020-04-25 DIAGNOSIS — Z716 Tobacco abuse counseling: Secondary | ICD-10-CM

## 2020-04-25 DIAGNOSIS — E1165 Type 2 diabetes mellitus with hyperglycemia: Secondary | ICD-10-CM

## 2020-04-25 DIAGNOSIS — E119 Type 2 diabetes mellitus without complications: Secondary | ICD-10-CM

## 2020-04-25 DIAGNOSIS — I10 Essential (primary) hypertension: Secondary | ICD-10-CM | POA: Diagnosis not present

## 2020-04-25 DIAGNOSIS — M0579 Rheumatoid arthritis with rheumatoid factor of multiple sites without organ or systems involvement: Secondary | ICD-10-CM

## 2020-04-25 DIAGNOSIS — E559 Vitamin D deficiency, unspecified: Secondary | ICD-10-CM

## 2020-04-25 DIAGNOSIS — Z720 Tobacco use: Secondary | ICD-10-CM | POA: Diagnosis not present

## 2020-04-25 DIAGNOSIS — Z7689 Persons encountering health services in other specified circumstances: Secondary | ICD-10-CM | POA: Diagnosis not present

## 2020-04-25 DIAGNOSIS — Z23 Encounter for immunization: Secondary | ICD-10-CM | POA: Diagnosis not present

## 2020-04-25 LAB — POCT GLYCOSYLATED HEMOGLOBIN (HGB A1C)
HbA1c POC (<> result, manual entry): 6 % (ref 4.0–5.6)
HbA1c, POC (controlled diabetic range): 6 % (ref 0.0–7.0)
HbA1c, POC (prediabetic range): 6 % (ref 5.7–6.4)
Hemoglobin A1C: 6 % — AB (ref 4.0–5.6)

## 2020-04-25 MED ORDER — FAMOTIDINE 40 MG PO TABS: 40.0000 mg | ORAL_TABLET | Freq: Every day | ORAL | 3 refills | Status: DC

## 2020-04-25 MED ORDER — VARENICLINE TARTRATE 0.5 MG PO TABS: ORAL_TABLET | ORAL | 0 refills | Status: DC

## 2020-04-25 MED ORDER — VARENICLINE TARTRATE 1 MG PO TABS: 1.0000 mg | ORAL_TABLET | Freq: Two times a day (BID) | ORAL | 1 refills | Status: AC

## 2020-04-25 NOTE — Assessment & Plan Note (Signed)
Care established Previous chart reviewed History and medications reviewed with the patient 

## 2020-04-25 NOTE — Assessment & Plan Note (Signed)
Smokes 1 packs/day  Asked about quitting: confirms that he currently smokes cigarettes Advise to quit smoking: Educated about QUITTING to reduce the risk of cancer, cardio and cerebrovascular disease. Assess willingness: willing to quit at this time Assist with counseling and pharmacotherapy: Counseled for 5 minutes and literature provided. Started Chantix. Arrange for follow up: Follow up and continue to offer help.

## 2020-04-25 NOTE — Progress Notes (Signed)
New Patient Office Visit  Subjective:  Patient ID: Darrell Terry, male    DOB: 02/03/1966  Age: 54 y.o. MRN: [id number]  CC:  Chief Complaint  Patient presents with  . 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  Patient states that he has chronic multiple joint pain due to rheumatoid arthritis. Pain is intermittent, variable in intensity and relieved with stretching exercises. He follows up with Rheumatologist for that and takes Plaquenil.  He was recently started on Humira, but has not started taking it as he is concerned about adverse reactions.  Patient was on methotrexate previously, but it was discontinued due to elevated liver enzymes.  Patient had his last ophthalmology visit in 05/2019.  Patient had a neck mass and a mass to be originating from base of tongue . He had biopsy done of the neck mass, which was found to be a benign mass (? Cyst vs fibroma) according to the patient. He used to follow up with Otorhinolaryngologist, and it resolved spontaneously.  Patient smokes 1 pack/day, and is willing to quit at this time. Patient wants medical therapy to help him quit smoking. After discussion, patient opts to try Chantix. Patient also smokes Marijuana at times to help with his joint pain and inflammation, and believes that it helps him control his RA.  Patient had last colonoscopy in 02/2015, had a hyperplastic rectal polyp.  He has had both doses of COVID vaccine. He had flu vaccine today in the office.  Past Medical History:  Diagnosis Date  . Depression   . Diabetes mellitus without complication ([place])   . Rheumatoid arthritis [facility])     Past Surgical History:  Procedure Laterality Date  . CERVICAL SPINE SURGERY      X 3  . COLONOSCOPY N/A 03/26/2015   Procedure: COLONOSCOPY;  Surgeon: Daneil Dolin, MD;  Location[facility];  Service: Endoscopy;  Laterality: N/A;  8:00 AM  . TONSILLECTOMY      Family History  Problem Relation Age of Onset  . Stroke Other        undocumented which relative   . High Cholesterol Other        undocumented which relative   . Heart disease Other        undocumented which relative   . Cancer - Other Other        undocumented which relative     Social History   Socioeconomic History  . Marital status: Divorced    Spouse name: Not on file  . Number of children: Not on file  . Years of education: Not on file  . Highest education level: Not on file  Occupational History  . Not on file  Tobacco Use  . Smoking status: Current Some Day Smoker    Packs/day: 1.00    Years: 34.00    Pack years: 34.00    Types: Cigarettes  . Smokeless tobacco: Never Used  Vaping Use  . Vaping Use: Never used  Substance and Sexual Activity  . Alcohol use: No    Alcohol/week: 0.0 standard drinks  . Drug use: Yes    Types: Marijuana    Comment: has not used in couple months   . Sexual activity: Not  on file  Other Topics Concern  . Not on file  Social History Narrative   ** Merged History Encounter **       Social Determinants of Health   Financial Resource Strain:   . Difficulty of Paying Living Expenses: Not on file  Food Insecurity:   . Worried About Charity fundraiser in the Last Year: Not on file  . Ran Out of Food in the Last Year: Not on file  Transportation Needs:   . Lack of Transportation (Medical): Not on file  . Lack of Transportation (Non-Medical): Not on file  Physical Activity:   . Days of Exercise per Week: Not on file  . Minutes of Exercise per Session: Not on file  Stress:   . Feeling of Stress : Not on file  Social Connections:   . Frequency of Communication with Friends and Family: Not on file  . Frequency of Social Gatherings with  Friends and Family: Not on file  . Attends Religious Services: Not on file  . Active Member of Clubs or Organizations: Not on file  . Attends Archivist Meetings: Not on file  . Marital Status: Not on file  Intimate Partner Violence:   . Fear of Current or Ex-Partner: Not on file  . Emotionally Abused: Not on file  . Physically Abused: Not on file  . Sexually Abused: Not on file    ROS Review of Systems  Constitutional: Negative for chills and fever.  HENT: Negative for congestion and sore throat.   Eyes: Negative for pain and discharge.  Respiratory: Negative for cough and shortness of breath.   Cardiovascular: Negative for chest pain and palpitations.  Gastrointestinal: Negative for constipation, diarrhea, nausea and vomiting.  Endocrine: Negative for polydipsia and polyuria.  Genitourinary: Negative for dysuria and hematuria.  Musculoskeletal: Positive for arthralgias, back pain and neck pain. Negative for neck stiffness.  Skin: Negative for rash.  Neurological: Negative for dizziness, weakness, numbness and headaches.  Psychiatric/Behavioral: Negative for agitation and behavioral problems.    Objective:   Today's Vitals: BP (!) 146/85 (BP Location: Right Arm, Patient Position: Sitting, Cuff Size: Normal)   Pulse 77   Temp 97.9 F (36.6 C) (Temporal)   Resp 18   Ht 6' (1.829 m)   Wt 203 lb 6.4 oz (92.3 kg)   SpO2 93%   BMI 27.59 kg/m   Physical Exam Vitals reviewed.  Constitutional:      General: He is not in acute distress.    Appearance: He is not diaphoretic.  HENT:     Head: Normocephalic and atraumatic.     Nose: Nose normal.     Mouth/Throat:     Mouth: Mucous membranes are moist.  Eyes:     General: No scleral icterus.    Extraocular Movements: Extraocular movements intact.     Pupils: Pupils are equal, round, and reactive to light.  Cardiovascular:     Rate and Rhythm: Normal rate and regular rhythm.     Pulses: Normal pulses.     Heart  sounds: No murmur heard.   Pulmonary:     Breath sounds: Normal breath sounds. No wheezing or rales.  Abdominal:     Palpations: Abdomen is soft.     Tenderness: There is no abdominal tenderness.  Musculoskeletal:        General: No swelling. Normal range of motion.     Cervical back: Neck supple. No tenderness.     Right lower leg: No edema.  Left lower leg: No edema.  Skin:    General: Skin is warm.     Findings: No rash.  Neurological:     General: No focal deficit present.     Mental Status: He is alert and oriented to person, place, and time.  Psychiatric:        Mood and Affect: Mood normal.        Behavior: Behavior normal.     Assessment & Plan:   .Encounter to establish care Care established Previous chart reviewed History and medications reviewed with the patient   Rheumatoid arthritis involving multiple sites with positive rheumatoid factor ([place]) On Plaquenil Recently started Humira, patient has not started taking it. Advised to start it as directed. Follows up with Rheumatologist Advised eye exam as he takes Plaquenil  DM (diabetes mellitus) ([place]) HbA1C: 6.0 today in office On Metformin 500 mg BID Advised to follow diabetic diet Did not tolerate Crestor in the past, will check CMP, lipid profile and start Simvastatin in the next visit F/u CMP and lipid panel Diabetic foot exam: In next visit Diabetic eye exam: Advised to follow up with Ophthalmology for diabetic eye exam   Tobacco abuse Smokes 1 packs/day  Asked about quitting: confirms that he currently smokes cigarettes Advise to quit smoking: Educated about QUITTING to reduce the risk of cancer, cardio and cerebrovascular disease. Assess willingness: willing to quit at this time Assist with counseling and pharmacotherapy: Counseled for 5 minutes and literature provided. Started Chantix. Arrange for follow up: Follow up and continue to offer help.   Renal mass, left Found to be benign mass  in the past, no surveillance imaging noted as per Urology note at [facility]  Hypertension BP: 146/85 States his BP is around 130/80 at home. Will recheck in next visit, advised to bring the log in next visit Advised DASH diet and moderate exercise Plan to start ACEi/ARB following CMP        Other Visit Diagnoses    Need for immunization against influenza       Relevant Orders   Flu Vaccine QUAD 36+ mos IM (Completed)   Gastroesophageal reflux disease without esophagitis       Relevant Medications   famotidine (PEPCID) 40 MG tablet   Encounter for smoking cessation counseling       Relevant Medications   varenicline (CHANTIX) 0.5 MG tablet   varenicline (CHANTIX CONTINUING MONTH PAK) 1 MG tablet   Other Relevant Orders   CBC   COMPLETE METABOLIC PANEL WITH GFR   Lipid panel   VITAMIN D 25 Hydroxy (Vit-D Deficiency, Fractures)   TSH + free T4   Vitamin D deficiency       Relevant Orders   VITAMIN D 25 Hydroxy (Vit-D Deficiency, Fractures)      Outpatient Encounter Medications as of 04/25/2020  Medication Sig  . Cholecalciferol 25 MCG (1000 UT) capsule Take by mouth.  . hydroxychloroquine (PLAQUENIL) 200 MG tablet Take 200 mg by mouth 2 (two) times daily.  . metFORMIN (GLUCOPHAGE) 500 MG tablet Take 500 mg by mouth 2 (two) times daily.  . Omega-3 Fatty Acids (FISH OIL) 1000 MG CAPS Take 5,000 mg by mouth daily.   . famotidine (PEPCID) 40 MG tablet Take 1 tablet (40 mg total) by mouth daily.  Marland Kitchen HUMIRA PEN 40 MG/0.4ML PNKT SMARTSIG:40 Milligram(s) SUB-Q Every 2 Weeks (Patient not taking: Reported on 04/25/2020)  . varenicline (CHANTIX CONTINUING MONTH PAK) 1 MG tablet Take 1 tablet (1 mg  total) by mouth 2 (two) times daily. Start taking it after starter pack is completed.  . varenicline (CHANTIX) 0.5 MG tablet Take 1 tablet once daily for 3 days and then 1 tablet twice daily for next 4 days.  . [DISCONTINUED] folic acid (FOLVITE) 253 MCG tablet Take 800 mcg by  mouth daily.  (Patient not taking: Reported on 04/25/2020)  . [DISCONTINUED] omeprazole (PRILOSEC) 20 MG capsule Take 20 mg by mouth daily. (Patient not taking: Reported on 04/25/2020)   No facility-administered encounter medications on file as of 04/25/2020.    Follow-up: Return in about 3 weeks (around 05/16/2020).   Lindell Spar, MD

## 2020-04-25 NOTE — Patient Instructions (Signed)
Please get blood tests done today.  You are advised to quit smoking as soon as possible and start taking Chantix as directed.  Please check BP at home and bring the log in the next visit.  Please follow diabetic diet, low-sugar, low-cholesterol diet. DASH diet also helps with hypertension.  DASH stands for Dietary Approaches to Stop Hypertension. The DASH diet is a healthy-eating plan designed to help treat or prevent high blood pressure (hypertension).  The DASH diet includes foods that are rich in potassium, calcium and magnesium. These nutrients help control blood pressure. The diet limits foods that are high in sodium, saturated fat and added sugars.  Studies have shown that the DASH diet can lower blood pressure in as little as two weeks. The diet can also lower low-density lipoprotein (LDL or "bad") cholesterol levels in the blood. High blood pressure and high LDL cholesterol levels are two major risk factors for heart disease and stroke.    DASH diet: Recommended servings The DASH diet provides daily and weekly nutritional goals. The number of servings you should have depends on your daily calorie needs.  Here's a look at the recommended servings from each food group for a 2,000-calorie-a-day DASH diet:  Grains: 6 to 8 servings a day. One serving is one slice bread, 1 ounce dry cereal, or 1/2 cup cooked cereal, rice or pasta. Vegetables: 4 to 5 servings a day. One serving is 1 cup raw leafy green vegetable, 1/2 cup cut-up raw or cooked vegetables, or 1/2 cup vegetable juice. Fruits: 4 to 5 servings a day. One serving is one medium fruit, 1/2 cup fresh, frozen or canned fruit, or 1/2 cup fruit juice. Fat-free or low-fat dairy products: 2 to 3 servings a day. One serving is 1 cup milk or yogurt, or 1 1/2 ounces cheese. Lean meats, poultry and fish: six 1-ounce servings or fewer a day. One serving is 1 ounce cooked meat, poultry or fish, or 1 egg. Nuts, seeds and legumes: 4 to 5  servings a week. One serving is 1/3 cup nuts, 2 tablespoons peanut butter, 2 tablespoons seeds, or 1/2 cup cooked legumes (dried beans or peas). Fats and oils: 2 to 3 servings a day. One serving is 1 teaspoon soft margarine, 1 teaspoon vegetable oil, 1 tablespoon mayonnaise or 2 tablespoons salad dressing. Sweets and added sugars: 5 servings or fewer a week. One serving is 1 tablespoon sugar, jelly or jam, 1/2 cup sorbet, or 1 cup lemonade.

## 2020-04-25 NOTE — Assessment & Plan Note (Signed)
On Plaquenil Recently started Humira, patient has not started taking it. Advised to start it as directed. Follows up with Rheumatologist Advised eye exam as he takes Plaquenil

## 2020-04-25 NOTE — Assessment & Plan Note (Signed)
Found to be benign mass in the past, no surveillance imaging noted as per Urology note at [facility]

## 2020-04-25 NOTE — Assessment & Plan Note (Signed)
BP: 146/85 States his BP is around 130/80 at home. Will recheck in next visit, advised to bring the log in next visit Advised DASH diet and moderate exercise Plan to start ACEi/ARB following CMP

## 2020-04-25 NOTE — Assessment & Plan Note (Signed)
HbA1C: 6.0 today in office On Metformin 500 mg BID Advised to follow diabetic diet Did not tolerate Crestor in the past, will check CMP, lipid profile and start Simvastatin in the next visit F/u CMP and lipid panel Diabetic foot exam: In next visit Diabetic eye exam: Advised to follow up with Ophthalmology for diabetic eye exam

## 2020-04-26 ENCOUNTER — Ambulatory Visit: Payer: Medicare Other | Admitting: Internal Medicine

## 2020-04-26 ENCOUNTER — Other Ambulatory Visit: Payer: Self-pay | Admitting: Internal Medicine

## 2020-04-26 DIAGNOSIS — E785 Hyperlipidemia, unspecified: Secondary | ICD-10-CM

## 2020-04-26 DIAGNOSIS — I10 Essential (primary) hypertension: Secondary | ICD-10-CM

## 2020-04-26 LAB — COMPLETE METABOLIC PANEL WITH GFR
AG Ratio: 1.5 (calc) (ref 1.0–2.5)
ALT: 16 U/L (ref 9–46)
AST: 19 U/L (ref 10–35)
Albumin: 4.6 g/dL (ref 3.6–5.1)
Alkaline phosphatase (APISO): 77 U/L (ref 35–144)
BUN: 11 mg/dL (ref 7–25)
CO2: 29 mmol/L (ref 20–32)
Calcium: 10 mg/dL (ref 8.6–10.3)
Chloride: 102 mmol/L (ref 98–110)
Creat: 1.24 mg/dL (ref 0.70–1.33)
GFR, Est African American: 75 mL/min/{1.73_m2} (ref >=60)
GFR, Est Non African American: 65 mL/min/{1.73_m2} (ref >=60)
Globulin: 3.1 g/dL (calc) (ref 1.9–3.7)
Glucose, Bld: 93 mg/dL (ref 65–99)
Potassium: 5 mmol/L (ref 3.5–5.3)
Sodium: 140 mmol/L (ref 135–146)
Total Bilirubin: 0.5 mg/dL (ref 0.2–1.2)
Total Protein: 7.7 g/dL (ref 6.1–8.1)

## 2020-04-26 LAB — CBC
HCT: 47.4 % (ref 38.5–50.0)
Hemoglobin: 15.8 g/dL (ref 13.2–17.1)
MCH: 28.5 pg (ref 27.0–33.0)
MCHC: 33.3 g/dL (ref 32.0–36.0)
MCV: 85.4 fL (ref 80.0–100.0)
MPV: 10.5 fL (ref 7.5–12.5)
Platelets: 195 10*3/uL (ref 140–400)
RBC: 5.55 10*6/uL (ref 4.20–5.80)
RDW: 13.6 % (ref 11.0–15.0)
WBC: 8.7 10*3/uL (ref 3.8–10.8)

## 2020-04-26 LAB — LIPID PANEL
Cholesterol: 216 mg/dL — ABNORMAL HIGH (ref <200)
HDL: 43 mg/dL (ref >=40)
LDL Cholesterol (Calc): 140 mg/dL (calc) — ABNORMAL HIGH
Non-HDL Cholesterol (Calc): 173 mg/dL (calc) — ABNORMAL HIGH (ref <130)
Total CHOL/HDL Ratio: 5 (calc) — ABNORMAL HIGH (ref <5.0)
Triglycerides: 191 mg/dL — ABNORMAL HIGH (ref <150)

## 2020-04-26 LAB — VITAMIN D 25 HYDROXY (VIT D DEFICIENCY, FRACTURES): Vit D, 25-Hydroxy: 99 ng/mL (ref 30–100)

## 2020-04-26 LAB — MICROALBUMIN, URINE: Microalb, Ur: 14.5 ug/mL — ABNORMAL HIGH

## 2020-04-26 LAB — TSH+FREE T4: TSH W/REFLEX TO FT4: 2.2 mIU/L (ref 0.40–4.50)

## 2020-04-26 MED ORDER — ATORVASTATIN CALCIUM 40 MG PO TABS: 40.0000 mg | ORAL_TABLET | Freq: Every day | ORAL | 3 refills | Status: DC

## 2020-04-26 MED ORDER — LISINOPRIL 5 MG PO TABS: 5.0000 mg | ORAL_TABLET | Freq: Every day | ORAL | 3 refills | Status: DC

## 2020-05-16 ENCOUNTER — Ambulatory Visit (INDEPENDENT_AMBULATORY_CARE_PROVIDER_SITE_OTHER): Payer: Medicare Other | Admitting: Internal Medicine

## 2020-05-16 ENCOUNTER — Other Ambulatory Visit: Payer: Self-pay | Admitting: *Deleted

## 2020-05-16 ENCOUNTER — Other Ambulatory Visit: Payer: Self-pay

## 2020-05-16 ENCOUNTER — Encounter: Payer: Self-pay | Admitting: Internal Medicine

## 2020-05-16 VITALS — BP 137/83 | HR 75 | Temp 97.8°F | Resp 18 | Ht 72.0 in | Wt 208.4 lb

## 2020-05-16 DIAGNOSIS — I10 Essential (primary) hypertension: Secondary | ICD-10-CM

## 2020-05-16 DIAGNOSIS — K219 Gastro-esophageal reflux disease without esophagitis: Secondary | ICD-10-CM | POA: Diagnosis not present

## 2020-05-16 DIAGNOSIS — E119 Type 2 diabetes mellitus without complications: Secondary | ICD-10-CM

## 2020-05-16 DIAGNOSIS — F17210 Nicotine dependence, cigarettes, uncomplicated: Secondary | ICD-10-CM | POA: Diagnosis not present

## 2020-05-16 DIAGNOSIS — Z720 Tobacco use: Secondary | ICD-10-CM | POA: Diagnosis not present

## 2020-05-16 MED ORDER — METFORMIN HCL 500 MG PO TABS: 500.0000 mg | ORAL_TABLET | Freq: Two times a day (BID) | ORAL | 1 refills | Status: DC

## 2020-05-16 MED ORDER — OMEPRAZOLE 40 MG PO CPDR: 40.0000 mg | DELAYED_RELEASE_CAPSULE | Freq: Every day | ORAL | 3 refills | Status: DC

## 2020-05-16 NOTE — Progress Notes (Signed)
Established Patient Office Visit  Subjective:  Patient ID: Darrell Terry, male    DOB: August 26, 1965  Age: 54 y.o. MRN: [id number]  CC:  Chief Complaint  Patient presents with  . Follow-up    3 week follow up no chantix did not help quit smoking and heartburn medication did not help at all     HPI Darrell Terry is a 54 year old male with PMH of HTN, DM, RA and GERD presents for follow up of his blood pressure. Patient did not check his BP at home. His BP is 137/83 today and he has been taking Lisinopril regularly. Patient denies any headache, dizziness, chest pain, dyspnea or palpitations.  Patient states that Chantix is not helping him and he continues to smoke. Patient is advised to quit smoking and continue to take Chantix. Patient was counseled on importance of his own determination to quit and then only Chantix will help him, he expressed understanding. He states that he wants to quit without Chantix. He has cut down to 1 pack/day now, which is same as last visit.  Patient also c/o heartburn and Pepcid has not been helping. Patient wants to be placed back on Prilosec. He denies nausea, vomiting, or abdominal pain.   Past Medical History:  Diagnosis Date  . Depression   . Diabetes mellitus without complication ([place])   . Rheumatoid arthritis [facility])     Past Surgical History:  Procedure Laterality Date  . CERVICAL SPINE SURGERY     X 3  . COLONOSCOPY N/A 03/26/2015   Procedure: COLONOSCOPY;  Surgeon: Daneil Dolin, MD;  [facility];  Service: Endoscopy;  Laterality: N/A;  8:00 AM  . TONSILLECTOMY      Family History  Problem Relation Age of Onset  . Stroke Other        undocumented which relative   . High Cholesterol Other        undocumented which relative   . Heart disease Other        undocumented which relative   . Cancer - Other Other        undocumented which relative     Social History   Socioeconomic History  . Marital status: Divorced     Spouse name: Not on file  . Number of children: Not on file  . Years of education: Not on file  . Highest education level: Not on file  Occupational History  . Not on file  Tobacco Use  . Smoking status: Current Some Day Smoker    Packs/day: 1.00    Years: 34.00    Pack years: 34.00    Types: Cigarettes  . Smokeless tobacco: Never Used  Vaping Use  . Vaping Use: Never used  Substance and Sexual Activity  . Alcohol use: No    Alcohol/week: 0.0 standard drinks  . Drug use: Yes    Types: Marijuana    Comment: has not used in couple months   . Sexual activity: Not on file  Other Topics Concern  . Not on file  Social History Narrative   ** Merged History Encounter **       Social Determinants of Health   Financial Resource Strain:   . Difficulty of Paying Living Expenses: Not on file  Food Insecurity:   . Worried About Charity fundraiser in the Last Year: Not on file  . Ran Out of Food in the Last Year: Not on file  Transportation Needs:   . Lack  of Transportation (Medical): Not on file  . Lack of Transportation (Non-Medical): Not on file  Physical Activity:   . Days of Exercise per Week: Not on file  . Minutes of Exercise per Session: Not on file  Stress:   . Feeling of Stress : Not on file  Social Connections:   . Frequency of Communication with Friends and Family: Not on file  . Frequency of Social Gatherings with Friends and Family: Not on file  . Attends Religious Services: Not on file  . Active Member of Clubs or Organizations: Not on file  . Attends Archivist Meetings: Not on file  . Marital Status: Not on file  Intimate Partner Violence:   . Fear of Current or Ex-Partner: Not on file  . Emotionally Abused: Not on file  . Physically Abused: Not on file  . Sexually Abused: Not on file    Outpatient Medications Prior to Visit  Medication Sig Dispense Refill  . atorvastatin (LIPITOR) 40 MG tablet Take 1 tablet (40 mg total) by mouth daily. 90  tablet 3  . Cholecalciferol 25 MCG (1000 UT) capsule Take by mouth.    Marland Kitchen HUMIRA PEN 40 MG/0.4ML PNKT SMARTSIG:40 Milligram(s) SUB-Q Every 2 Weeks    . hydroxychloroquine (PLAQUENIL) 200 MG tablet Take 200 mg by mouth 2 (two) times daily.    Marland Kitchen lisinopril (ZESTRIL) 5 MG tablet Take 1 tablet (5 mg total) by mouth daily. 30 tablet 3  . Omega-3 Fatty Acids (FISH OIL) 1000 MG CAPS Take 5,000 mg by mouth daily.     . famotidine (PEPCID) 40 MG tablet Take 1 tablet (40 mg total) by mouth daily. 30 tablet 3  . metFORMIN (GLUCOPHAGE) 500 MG tablet Take 500 mg by mouth 2 (two) times daily.    . varenicline (CHANTIX CONTINUING MONTH PAK) 1 MG tablet Take 1 tablet (1 mg total) by mouth 2 (two) times daily. Start taking it after starter pack is completed. (Patient not taking: Reported on 05/16/2020) 60 tablet 1  . varenicline (CHANTIX) 0.5 MG tablet Take 1 tablet once daily for 3 days and then 1 tablet twice daily for next 4 days. (Patient not taking: Reported on 05/16/2020) 11 tablet 0   No facility-administered medications prior to visit.    No Known Allergies  ROS Review of Systems  Constitutional: Negative for chills and fever.  HENT: Negative for congestion and sore throat.   Eyes: Negative for pain and discharge.  Respiratory: Negative for cough and shortness of breath.   Cardiovascular: Negative for chest pain and palpitations.  Gastrointestinal: Negative for constipation, diarrhea, nausea and vomiting.  Endocrine: Negative for polydipsia and polyuria.  Genitourinary: Negative for dysuria and hematuria.  Musculoskeletal: Positive for arthralgias, back pain and neck pain. Negative for neck stiffness.  Skin: Negative for rash.  Neurological: Negative for dizziness, weakness, numbness and headaches.  Psychiatric/Behavioral: Negative for agitation and behavioral problems.      Objective:    Physical Exam Vitals reviewed.  Constitutional:      General: He is not in acute distress.     Appearance: He is not diaphoretic.  HENT:     Head: Normocephalic and atraumatic.     Nose: Nose normal.     Mouth/Throat:     Mouth: Mucous membranes are moist.  Eyes:     General: No scleral icterus.    Extraocular Movements: Extraocular movements intact.     Pupils: Pupils are equal, round, and reactive to light.  Cardiovascular:  Rate and Rhythm: Normal rate and regular rhythm.     Pulses: Normal pulses.     Heart sounds: Normal heart sounds. No murmur heard.  No friction rub. No gallop.   Pulmonary:     Breath sounds: Normal breath sounds. No wheezing or rales.  Abdominal:     Palpations: Abdomen is soft.     Tenderness: There is no abdominal tenderness. There is no guarding or rebound.  Musculoskeletal:        General: No swelling. Normal range of motion.     Cervical back: Neck supple. No tenderness.     Right lower leg: No edema.     Left lower leg: No edema.  Skin:    General: Skin is warm.     Findings: No rash.  Neurological:     General: No focal deficit present.     Mental Status: He is alert and oriented to person, place, and time.     Cranial Nerves: No cranial nerve deficit.     Sensory: No sensory deficit.  Psychiatric:        Mood and Affect: Mood normal.        Behavior: Behavior normal.     BP 137/83 (BP Location: Right Arm, Patient Position: Sitting)   Pulse 75   Temp 97.8 F (36.6 C) (Temporal)   Resp 18   Ht 6' (1.829 m)   Wt 208 lb 6.4 oz (94.5 kg)   SpO2 99%   BMI 28.26 kg/m  Wt Readings from Last 3 Encounters:  05/16/20 208 lb 6.4 oz (94.5 kg)  04/25/20 203 lb 6.4 oz (92.3 kg)  09/09/19 212 lb (96.2 kg)     Health Maintenance Due  Topic Date Due  . Hepatitis C Screening  Never done  . PNEUMOCOCCAL POLYSACCHARIDE VACCINE AGE 7-64 HIGH RISK  Never done  . OPHTHALMOLOGY EXAM  Never done  . HIV Screening  Never done  . TETANUS/TDAP  Never done    There are no preventive care reminders to display for this patient.  No results  found for: TSH Lab Results  Component Value Date   WBC 8.7 04/25/2020   HGB 15.8 04/25/2020   HCT 47.4 04/25/2020   MCV 85.4 04/25/2020   PLT 195 04/25/2020   Lab Results  Component Value Date   NA 140 04/25/2020   K 5.0 04/25/2020   CO2 29 04/25/2020   GLUCOSE 93 04/25/2020   BUN 11 04/25/2020   CREATININE 1.24 04/25/2020   BILITOT 0.5 04/25/2020   AST 19 04/25/2020   ALT 16 04/25/2020   PROT 7.7 04/25/2020   CALCIUM 10.0 04/25/2020   Lab Results  Component Value Date   CHOL 216 (H) 04/25/2020   Lab Results  Component Value Date   HDL 43 04/25/2020   Lab Results  Component Value Date   LDLCALC 140 (H) 04/25/2020   Lab Results  Component Value Date   TRIG 191 (H) 04/25/2020   Lab Results  Component Value Date   CHOLHDL 5.0 (H) 04/25/2020   Lab Results  Component Value Date   HGBA1C 6.0 (A) 04/25/2020   HGBA1C 6.0 04/25/2020   HGBA1C 6.0 04/25/2020   HGBA1C 6.0 04/25/2020      Assessment & Plan:   Problem List Items Addressed This Visit      Cardiovascular and Mediastinum   Hypertension    BP Readings from Last 1 Encounters:  05/16/20 137/83   Well-controlled with Lisinopril 5 mg QD Counseled for compliance  with the medications Advised DASH diet and moderate exercise/walking, at least 150 mins/week         Digestive   GERD (gastroesophageal reflux disease) - Primary    Had symptoms despite being on Pepcid Will restart Omeprazole      Relevant Medications   omeprazole (PRILOSEC) 40 MG capsule     Endocrine   DM (diabetes mellitus) ([facility])    HbA1C: 6.0 On Metformin Advised to follow diabetic diet On statin and ACEi F/u CMP and HbA1C in next visit Diabetic foot exam: Today Diabetic eye exam: Advised to follow up with Ophthalmology for diabetic eye exam         Other   Tobacco abuse    Asked about quitting: confirms that he currently smokes cigarettes Advise to quit smoking: Educated about QUITTING to reduce the risk of cancer,  cardio and cerebrovascular disease. Assess willingness: Unwilling to quit at this time, but is working on cutting back. Assist with counseling and pharmacotherapy: Counseled for 5 minutes and literature provided. Did not like Chantix. Wants to cut down on his own. Arrange for follow up: follow up in 3 months and continue to offer help.         Meds ordered this encounter  Medications  . omeprazole (PRILOSEC) 40 MG capsule    Sig: Take 1 capsule (40 mg total) by mouth daily.    Dispense:  30 capsule    Refill:  3    Follow-up: Return in about 3 months (around 08/16/2020).    Lindell Spar, MD

## 2020-05-16 NOTE — Assessment & Plan Note (Signed)
Asked about quitting: confirms that he currently smokes cigarettes Advise to quit smoking: Educated about QUITTING to reduce the risk of cancer, cardio and cerebrovascular disease. Assess willingness: Unwilling to quit at this time, but is working on cutting back. Assist with counseling and pharmacotherapy: Counseled for 5 minutes and literature provided. Did not like Chantix. Wants to cut down on his own. Arrange for follow up: follow up in 3 months and continue to offer help.

## 2020-05-16 NOTE — Patient Instructions (Addendum)
You are encouraged to quit smoking as soon as possible.  Please continue to take Lisinopril. Please check your BP at home and bring the log in the next visit.  Please start taking Omeprazole for GERD (acid reflux).  DASH stands for Dietary Approaches to Stop Hypertension. The DASH diet is a healthy-eating plan designed to help treat or prevent high blood pressure (hypertension).  The DASH diet includes foods that are rich in potassium, calcium and magnesium. These nutrients help control blood pressure. The diet limits foods that are high in sodium, saturated fat and added sugars.  Studies have shown that the DASH diet can lower blood pressure in as little as two weeks. The diet can also lower low-density lipoprotein (LDL or "bad") cholesterol levels in the blood. High blood pressure and high LDL cholesterol levels are two major risk factors for heart disease and stroke.    DASH diet: Recommended servings The DASH diet provides daily and weekly nutritional goals. The number of servings you should have depends on your daily calorie needs.  Here's a look at the recommended servings from each food group for a 2,000-calorie-a-day DASH diet:  Grains: 6 to 8 servings a day. One serving is one slice bread, 1 ounce dry cereal, or 1/2 cup cooked cereal, rice or pasta. Vegetables: 4 to 5 servings a day. One serving is 1 cup raw leafy green vegetable, 1/2 cup cut-up raw or cooked vegetables, or 1/2 cup vegetable juice. Fruits: 4 to 5 servings a day. One serving is one medium fruit, 1/2 cup fresh, frozen or canned fruit, or 1/2 cup fruit juice. Fat-free or low-fat dairy products: 2 to 3 servings a day. One serving is 1 cup milk or yogurt, or 1 1/2 ounces cheese. Lean meats, poultry and fish: six 1-ounce servings or fewer a day. One serving is 1 ounce cooked meat, poultry or fish, or 1 egg. Nuts, seeds and legumes: 4 to 5 servings a week. One serving is 1/3 cup nuts, 2 tablespoons peanut butter, 2  tablespoons seeds, or 1/2 cup cooked legumes (dried beans or peas). Fats and oils: 2 to 3 servings a day. One serving is 1 teaspoon soft margarine, 1 teaspoon vegetable oil, 1 tablespoon mayonnaise or 2 tablespoons salad dressing. Sweets and added sugars: 5 servings or fewer a week. One serving is 1 tablespoon sugar, jelly or jam, 1/2 cup sorbet, or 1 cup lemonade.

## 2020-05-16 NOTE — Assessment & Plan Note (Signed)
BP Readings from Last 1 Encounters:  05/16/20 137/83   Well-controlled with Lisinopril 5 mg QD Counseled for compliance with the medications Advised DASH diet and moderate exercise/walking, at least 150 mins/week

## 2020-05-16 NOTE — Assessment & Plan Note (Addendum)
HbA1C: 6.0 On Metformin Advised to follow diabetic diet On statin and ACEi F/u CMP and HbA1C in next visit Diabetic foot exam: Today Diabetic eye exam: Advised to follow up with Ophthalmology for diabetic eye exam

## 2020-05-16 NOTE — Assessment & Plan Note (Signed)
Had symptoms despite being on Pepcid Will restart Omeprazole

## 2020-05-17 DIAGNOSIS — Z1389 Encounter for screening for other disorder: Secondary | ICD-10-CM | POA: Diagnosis not present

## 2020-05-17 DIAGNOSIS — I10 Essential (primary) hypertension: Secondary | ICD-10-CM | POA: Diagnosis not present

## 2020-05-17 DIAGNOSIS — Z8249 Family history of ischemic heart disease and other diseases of the circulatory system: Secondary | ICD-10-CM | POA: Diagnosis not present

## 2020-05-23 DIAGNOSIS — I10 Essential (primary) hypertension: Secondary | ICD-10-CM | POA: Diagnosis not present

## 2020-05-23 DIAGNOSIS — Z8249 Family history of ischemic heart disease and other diseases of the circulatory system: Secondary | ICD-10-CM | POA: Diagnosis not present

## 2020-05-23 DIAGNOSIS — Z1389 Encounter for screening for other disorder: Secondary | ICD-10-CM | POA: Diagnosis not present

## 2020-06-07 ENCOUNTER — Other Ambulatory Visit: Payer: Self-pay | Admitting: *Deleted

## 2020-06-07 MED ORDER — METFORMIN HCL 500 MG PO TABS: 500.0000 mg | ORAL_TABLET | Freq: Two times a day (BID) | ORAL | 1 refills | Status: DC

## 2020-06-27 DIAGNOSIS — K219 Gastro-esophageal reflux disease without esophagitis: Secondary | ICD-10-CM | POA: Diagnosis not present

## 2020-06-27 DIAGNOSIS — Z1379 Encounter for other screening for genetic and chromosomal anomalies: Secondary | ICD-10-CM | POA: Diagnosis not present

## 2020-06-28 ENCOUNTER — Other Ambulatory Visit: Payer: Self-pay | Admitting: *Deleted

## 2020-06-28 DIAGNOSIS — I10 Essential (primary) hypertension: Secondary | ICD-10-CM

## 2020-06-28 MED ORDER — LISINOPRIL 5 MG PO TABS: 5.0000 mg | ORAL_TABLET | Freq: Every day | ORAL | 1 refills | Status: DC

## 2020-07-09 DIAGNOSIS — I10 Essential (primary) hypertension: Secondary | ICD-10-CM | POA: Diagnosis not present

## 2020-07-18 DIAGNOSIS — Z23 Encounter for immunization: Secondary | ICD-10-CM | POA: Diagnosis not present

## 2020-08-10 DIAGNOSIS — R945 Abnormal results of liver function studies: Secondary | ICD-10-CM | POA: Diagnosis not present

## 2020-08-10 DIAGNOSIS — M153 Secondary multiple arthritis: Secondary | ICD-10-CM | POA: Diagnosis not present

## 2020-08-10 DIAGNOSIS — M0579 Rheumatoid arthritis with rheumatoid factor of multiple sites without organ or systems involvement: Secondary | ICD-10-CM | POA: Diagnosis not present

## 2020-08-10 DIAGNOSIS — Z79899 Other long term (current) drug therapy: Secondary | ICD-10-CM | POA: Diagnosis not present

## 2020-08-16 ENCOUNTER — Other Ambulatory Visit: Payer: Self-pay

## 2020-08-16 ENCOUNTER — Encounter: Payer: Self-pay | Admitting: Internal Medicine

## 2020-08-16 ENCOUNTER — Ambulatory Visit (INDEPENDENT_AMBULATORY_CARE_PROVIDER_SITE_OTHER): Payer: Medicare Other | Admitting: Internal Medicine

## 2020-08-16 VITALS — BP 167/86 | HR 80 | Temp 97.9°F | Resp 20 | Ht 72.0 in | Wt 216.0 lb

## 2020-08-16 DIAGNOSIS — E119 Type 2 diabetes mellitus without complications: Secondary | ICD-10-CM | POA: Diagnosis not present

## 2020-08-16 DIAGNOSIS — Z720 Tobacco use: Secondary | ICD-10-CM

## 2020-08-16 DIAGNOSIS — I10 Essential (primary) hypertension: Secondary | ICD-10-CM | POA: Diagnosis not present

## 2020-08-16 DIAGNOSIS — Z122 Encounter for screening for malignant neoplasm of respiratory organs: Secondary | ICD-10-CM

## 2020-08-16 DIAGNOSIS — K219 Gastro-esophageal reflux disease without esophagitis: Secondary | ICD-10-CM

## 2020-08-16 DIAGNOSIS — M0579 Rheumatoid arthritis with rheumatoid factor of multiple sites without organ or systems involvement: Secondary | ICD-10-CM | POA: Diagnosis not present

## 2020-08-16 MED ORDER — LISINOPRIL-HYDROCHLOROTHIAZIDE 10-12.5 MG PO TABS: 1.0000 | ORAL_TABLET | Freq: Every day | ORAL | 1 refills | Status: DC

## 2020-08-16 NOTE — Assessment & Plan Note (Signed)
HbA1C: 6.4 from 6.0 On Metformin 500 mg BID Advised to follow diabetic diet On statin and ACEi F/u CMP and HbA1C in next visit Diabetic eye exam: Advised to follow up with Ophthalmology for diabetic eye exam

## 2020-08-16 NOTE — Assessment & Plan Note (Signed)
Asked about quitting: confirms that he currently smokes cigarettes Advise to quit smoking: Educated about QUITTING to reduce the risk of cancer, cardio and cerebrovascular disease. Assess willingness: Unwilling to quit at this time, but is working on cutting back. Assist with counseling and pharmacotherapy: Counseled for 5 minutes and literature provided. Arrange for follow up: follow up in 3 months and continue to offer help. 

## 2020-08-16 NOTE — Progress Notes (Signed)
Established Patient Office Visit  Subjective:  Patient ID: Darrell Terry, male    DOB: May 22, 1966  Age: 55 y.o. MRN: [id number]  CC:  Chief Complaint  Patient presents with  . Hypertension  . Follow-up  . Gastroesophageal Reflux    HPI Darrell Terry is a 55 year old male with PMH of HTN, DM, RA and GERD presents for follow up of his blood pressure who presents for follow up of his chronic medical conditions.  His BP was 167/86 in the office today, which was found to be 153/92 on repeat check. He states that he has chronic chest discomfort, but denies any headache, dizziness, dyspnea or palpitations. He denies any exacerbating or alleviating factors.  He continues to smoke, but is trying to cut down.  He states that he has been having worse joint pains, mainly due to cold weather. He is taking Plaquenil and Humira for his RA.  Past Medical History:  Diagnosis Date  . Depression   . Diabetes mellitus without complication ([place])   . Rheumatoid arthritis [facility])     Past Surgical History:  Procedure Laterality Date  . CERVICAL SPINE SURGERY     X 3  . COLONOSCOPY N/A 03/26/2015   Procedure: COLONOSCOPY;  Surgeon: Daneil Dolin, MD;  Location: [facility];  Service: Endoscopy;  Laterality: N/A;  8:00 AM  . TONSILLECTOMY      Family History  Problem Relation Age of Onset  . Stroke Other        undocumented which relative   . High Cholesterol Other        undocumented which relative   . Heart disease Other        undocumented which relative   . Cancer - Other Other        undocumented which relative     Social History   Socioeconomic History  . Marital status: Divorced    Spouse name: Not on file  . Number of children: Not on file  . Years of education: Not on file  . Highest education level: Not on file  Occupational History  . Not on file  Tobacco Use  . Smoking status: Current Some Day Smoker    Packs/day: 1.00    Years: 34.00    Pack years: 34.00     Types: Cigarettes  . Smokeless tobacco: Never Used  Vaping Use  . Vaping Use: Never used  Substance and Sexual Activity  . Alcohol use: No    Alcohol/week: 0.0 standard drinks  . Drug use: Yes    Types: Marijuana    Comment: has not used in couple months   . Sexual activity: Not on file  Other Topics Concern  . Not on file  Social History Narrative   ** Merged History Encounter **       Social Determinants of Health   Financial Resource Strain: Not on file  Food Insecurity: Not on file  Transportation Needs: Not on file  Physical Activity: Not on file  Stress: Not on file  Social Connections: Not on file  Intimate Partner Violence: Not on file    Outpatient Medications Prior to Visit  Medication Sig Dispense Refill  . atorvastatin (LIPITOR) 40 MG tablet Take 1 tablet (40 mg total) by mouth daily. 90 tablet 3  . Cholecalciferol 25 MCG (1000 UT) capsule Take by mouth.    Marland Kitchen HUMIRA PEN 40 MG/0.4ML PNKT SMARTSIG:40 Milligram(s) SUB-Q Every 2 Weeks    . hydroxychloroquine (PLAQUENIL) 200 MG  tablet Take 200 mg by mouth 2 (two) times daily.    . metFORMIN (GLUCOPHAGE) 500 MG tablet Take 1 tablet (500 mg total) by mouth 2 (two) times daily. 180 tablet 1  . Omega-3 Fatty Acids (FISH OIL) 1000 MG CAPS Take 5,000 mg by mouth daily.     Marland Kitchen omeprazole (PRILOSEC) 40 MG capsule Take 1 capsule (40 mg total) by mouth daily. 30 capsule 3  . varenicline (CHANTIX) 0.5 MG tablet Take 1 tablet once daily for 3 days and then 1 tablet twice daily for next 4 days. 11 tablet 0  . lisinopril (ZESTRIL) 5 MG tablet Take 1 tablet (5 mg total) by mouth daily. 90 tablet 1   No facility-administered medications prior to visit.    No Known Allergies  ROS Review of Systems  Constitutional: Negative for chills and fever.  HENT: Negative for congestion and sore throat.   Eyes: Negative for pain and discharge.  Respiratory: Negative for cough and shortness of breath.   Cardiovascular: Negative for  palpitations and leg swelling.  Gastrointestinal: Negative for constipation, diarrhea, nausea and vomiting.  Endocrine: Negative for polydipsia and polyuria.  Genitourinary: Negative for dysuria and hematuria.  Musculoskeletal: Positive for arthralgias, back pain and neck pain. Negative for neck stiffness.  Skin: Negative for rash.  Neurological: Negative for dizziness, weakness, numbness and headaches.  Psychiatric/Behavioral: Negative for agitation and behavioral problems.      Objective:    Physical Exam Vitals reviewed.  Constitutional:      General: He is not in acute distress.    Appearance: He is not diaphoretic.  HENT:     Head: Normocephalic and atraumatic.     Nose: Nose normal.     Mouth/Throat:     Mouth: Mucous membranes are moist.  Eyes:     General: No scleral icterus.    Extraocular Movements: Extraocular movements intact.     Pupils: Pupils are equal, round, and reactive to light.  Cardiovascular:     Rate and Rhythm: Normal rate and regular rhythm.     Pulses: Normal pulses.     Heart sounds: Normal heart sounds. No murmur heard. No friction rub. No gallop.   Pulmonary:     Breath sounds: Normal breath sounds. No wheezing or rales.  Abdominal:     Palpations: Abdomen is soft.     Tenderness: There is no abdominal tenderness. There is no guarding or rebound.  Musculoskeletal:        General: No swelling. Normal range of motion.     Cervical back: Neck supple. No tenderness.     Right lower leg: No edema.     Left lower leg: No edema.  Skin:    General: Skin is warm.     Findings: No rash.  Neurological:     General: No focal deficit present.     Mental Status: He is alert and oriented to person, place, and time.     Cranial Nerves: No cranial nerve deficit.     Sensory: No sensory deficit.  Psychiatric:        Mood and Affect: Mood normal.        Behavior: Behavior normal.     BP (!) 167/86   Pulse 80   Temp 97.9 F (36.6 C)   Resp 20   Ht  6' (1.829 m)   Wt 216 lb (98 kg)   SpO2 97%   BMI 29.29 kg/m  Wt Readings from Last 3 Encounters:  08/16/20 216  lb (98 kg)  05/16/20 208 lb 6.4 oz (94.5 kg)  04/25/20 203 lb 6.4 oz (92.3 kg)     Health Maintenance Due  Topic Date Due  . Hepatitis C Screening  Never done  . PNEUMOCOCCAL POLYSACCHARIDE VACCINE AGE 31-64 HIGH RISK  Never done  . OPHTHALMOLOGY EXAM  Never done  . HIV Screening  Never done  . TETANUS/TDAP  Never done  . COVID-19 Vaccine (3 - Booster for Pfizer series) 05/13/2020    There are no preventive care reminders to display for this patient.  No results found for: TSH Lab Results  Component Value Date   WBC 8.7 04/25/2020   HGB 15.8 04/25/2020   HCT 47.4 04/25/2020   MCV 85.4 04/25/2020   PLT 195 04/25/2020   Lab Results  Component Value Date   NA 140 04/25/2020   K 5.0 04/25/2020   CO2 29 04/25/2020   GLUCOSE 93 04/25/2020   BUN 11 04/25/2020   CREATININE 1.24 04/25/2020   BILITOT 0.5 04/25/2020   AST 19 04/25/2020   ALT 16 04/25/2020   PROT 7.7 04/25/2020   CALCIUM 10.0 04/25/2020   Lab Results  Component Value Date   CHOL 216 (H) 04/25/2020   Lab Results  Component Value Date   HDL 43 04/25/2020   Lab Results  Component Value Date   LDLCALC 140 (H) 04/25/2020   Lab Results  Component Value Date   TRIG 191 (H) 04/25/2020   Lab Results  Component Value Date   CHOLHDL 5.0 (H) 04/25/2020   Lab Results  Component Value Date   HGBA1C 6.0 (A) 04/25/2020   HGBA1C 6.0 04/25/2020   HGBA1C 6.0 04/25/2020   HGBA1C 6.0 04/25/2020      Assessment & Plan:   Problem List Items Addressed This Visit      Cardiovascular and Mediastinum   Hypertension - Primary    BP Readings from Last 1 Encounters:  08/16/20 (!) 167/86   Uncontrolled with Lisinopril 5 mg QD Many contributing factors today, recent coffee intake and smoking Switched to Lisinopril-HCTZ 10-12.5 mg QD Counseled for compliance with the medications Advised DASH diet  and moderate exercise/walking, at least 150 mins/week  EKG: Sinus rhythm. HR: 72. No signs of active ischemia.       Relevant Medications   lisinopril-hydrochlorothiazide (ZESTORETIC) 10-12.5 MG tablet   Other Relevant Orders   CBC with Differential   CMP14+EGFR   TSH     Digestive   GERD (gastroesophageal reflux disease)    On Omeprazole        Endocrine   DM (diabetes mellitus) (HCC)    HbA1C: 6.4 from 6.0 On Metformin 500 mg BID Advised to follow diabetic diet On statin and ACEi F/u CMP and HbA1C in next visit Diabetic eye exam: Advised to follow up with Ophthalmology for diabetic eye exam       Relevant Medications   lisinopril-hydrochlorothiazide (ZESTORETIC) 10-12.5 MG tablet   Other Relevant Orders   Lipid panel   Hemoglobin A1c     Musculoskeletal and Integument   Rheumatoid arthritis involving multiple sites with positive rheumatoid factor (HCC)    On Plaquenil and Humira Follows up with Rheumatologist Advised eye exam as he takes Plaquenil        Other   Tobacco abuse    Asked about quitting: confirms that he currently smokes cigarettes Advise to quit smoking: Educated about QUITTING to reduce the risk of cancer, cardio and cerebrovascular disease. Assess willingness: Unwilling to quit at  this time, but is working on cutting back. Assist with counseling and pharmacotherapy: Counseled for 5 minutes and literature provided.  Arrange for follow up: follow up in 3 months and continue to offer help.       Other Visit Diagnoses    Screening for lung cancer       Relevant Orders   CT CHEST LUNG CA SCREEN LOW DOSE W/O CM      Meds ordered this encounter  Medications  . lisinopril-hydrochlorothiazide (ZESTORETIC) 10-12.5 MG tablet    Sig: Take 1 tablet by mouth daily.    Dispense:  90 tablet    Refill:  1    Follow-up: Return in about 4 months (around 12/14/2020).    Lindell Spar, MD

## 2020-08-16 NOTE — Assessment & Plan Note (Addendum)
BP Readings from Last 1 Encounters:  08/16/20 (!) 167/86   Uncontrolled with Lisinopril 5 mg QD Many contributing factors today, recent coffee intake and smoking Switched to Lisinopril-HCTZ 10-12.5 mg QD Counseled for compliance with the medications Advised DASH diet and moderate exercise/walking, at least 150 mins/week  EKG: Sinus rhythm. HR: 72. No signs of active ischemia.

## 2020-08-16 NOTE — Patient Instructions (Addendum)
Please start taking Lisinopril-HCTZ instead of Lisinopril.  Please continue your efforts to cut down smoking. Please avoid excessive caffeine intake.  Please get fasting blood tests before the next visit.  Please follow DASH diet and perform moderate exercise/walking at least 150 mins/week.  DASH stands for Dietary Approaches to Stop Hypertension. The DASH diet is a healthy-eating plan designed to help treat or prevent high blood pressure (hypertension).  The DASH diet includes foods that are rich in potassium, calcium and magnesium. These nutrients help control blood pressure. The diet limits foods that are high in sodium, saturated fat and added sugars.  Studies have shown that the DASH diet can lower blood pressure in as little as two weeks. The diet can also lower low-density lipoprotein (LDL or "bad") cholesterol levels in the blood. High blood pressure and high LDL cholesterol levels are two major risk factors for heart disease and stroke.    DASH diet: Recommended servings The DASH diet provides daily and weekly nutritional goals. The number of servings you should have depends on your daily calorie needs.  Here's a look at the recommended servings from each food group for a 2,000-calorie-a-day DASH diet:  Grains: 6 to 8 servings a day. One serving is one slice bread, 1 ounce dry cereal, or 1/2 cup cooked cereal, rice or pasta. Vegetables: 4 to 5 servings a day. One serving is 1 cup raw leafy green vegetable, 1/2 cup cut-up raw or cooked vegetables, or 1/2 cup vegetable juice. Fruits: 4 to 5 servings a day. One serving is one medium fruit, 1/2 cup fresh, frozen or canned fruit, or 1/2 cup fruit juice. Fat-free or low-fat dairy products: 2 to 3 servings a day. One serving is 1 cup milk or yogurt, or 1 1/2 ounces cheese. Lean meats, poultry and fish: six 1-ounce servings or fewer a day. One serving is 1 ounce cooked meat, poultry or fish, or 1 egg. Nuts, seeds and legumes: 4 to 5  servings a week. One serving is 1/3 cup nuts, 2 tablespoons peanut butter, 2 tablespoons seeds, or 1/2 cup cooked legumes (dried beans or peas). Fats and oils: 2 to 3 servings a day. One serving is 1 teaspoon soft margarine, 1 teaspoon vegetable oil, 1 tablespoon mayonnaise or 2 tablespoons salad dressing. Sweets and added sugars: 5 servings or fewer a week. One serving is 1 tablespoon sugar, jelly or jam, 1/2 cup sorbet, or 1 cup lemonade.

## 2020-08-16 NOTE — Assessment & Plan Note (Signed)
On Omeprazole 

## 2020-08-16 NOTE — Assessment & Plan Note (Signed)
On Plaquenil and Humira Follows up with Rheumatologist Advised eye exam as he takes Plaquenil

## 2020-08-28 ENCOUNTER — Encounter: Payer: Self-pay | Admitting: Internal Medicine

## 2020-08-29 ENCOUNTER — Telehealth: Payer: Self-pay

## 2020-08-29 NOTE — Telephone Encounter (Signed)
I sent pt a mychart message this would be up to [employer] as they do the scheduling and they are backed up. They will reach out to him with an appointment.

## 2020-08-29 NOTE — Telephone Encounter (Signed)
Please call the pt regarding CT SCAN he has not heard anything

## 2020-09-11 ENCOUNTER — Other Ambulatory Visit: Payer: Self-pay | Admitting: Internal Medicine

## 2020-09-11 DIAGNOSIS — D23122 Other benign neoplasm of skin of left lower eyelid, including canthus: Secondary | ICD-10-CM | POA: Diagnosis not present

## 2020-09-11 DIAGNOSIS — Z7984 Long term (current) use of oral hypoglycemic drugs: Secondary | ICD-10-CM | POA: Diagnosis not present

## 2020-09-11 DIAGNOSIS — M069 Rheumatoid arthritis, unspecified: Secondary | ICD-10-CM | POA: Diagnosis not present

## 2020-09-11 DIAGNOSIS — Z79899 Other long term (current) drug therapy: Secondary | ICD-10-CM | POA: Diagnosis not present

## 2020-09-11 DIAGNOSIS — H5203 Hypermetropia, bilateral: Secondary | ICD-10-CM | POA: Diagnosis not present

## 2020-09-11 DIAGNOSIS — H02832 Dermatochalasis of right lower eyelid: Secondary | ICD-10-CM | POA: Diagnosis not present

## 2020-09-11 DIAGNOSIS — H524 Presbyopia: Secondary | ICD-10-CM | POA: Diagnosis not present

## 2020-09-11 DIAGNOSIS — D23111 Other benign neoplasm of skin of right upper eyelid, including canthus: Secondary | ICD-10-CM | POA: Diagnosis not present

## 2020-09-11 DIAGNOSIS — H18513 Endothelial corneal dystrophy, bilateral: Secondary | ICD-10-CM | POA: Diagnosis not present

## 2020-09-11 DIAGNOSIS — D23121 Other benign neoplasm of skin of left upper eyelid, including canthus: Secondary | ICD-10-CM | POA: Diagnosis not present

## 2020-09-11 DIAGNOSIS — H2513 Age-related nuclear cataract, bilateral: Secondary | ICD-10-CM | POA: Diagnosis not present

## 2020-09-11 DIAGNOSIS — H02834 Dermatochalasis of left upper eyelid: Secondary | ICD-10-CM | POA: Diagnosis not present

## 2020-09-11 DIAGNOSIS — H52203 Unspecified astigmatism, bilateral: Secondary | ICD-10-CM | POA: Diagnosis not present

## 2020-09-11 DIAGNOSIS — E119 Type 2 diabetes mellitus without complications: Secondary | ICD-10-CM | POA: Diagnosis not present

## 2020-09-11 DIAGNOSIS — H527 Unspecified disorder of refraction: Secondary | ICD-10-CM | POA: Diagnosis not present

## 2020-09-11 DIAGNOSIS — H02831 Dermatochalasis of right upper eyelid: Secondary | ICD-10-CM | POA: Diagnosis not present

## 2020-09-11 DIAGNOSIS — H02835 Dermatochalasis of left lower eyelid: Secondary | ICD-10-CM | POA: Diagnosis not present

## 2020-09-11 DIAGNOSIS — E1136 Type 2 diabetes mellitus with diabetic cataract: Secondary | ICD-10-CM | POA: Diagnosis not present

## 2020-09-11 DIAGNOSIS — K219 Gastro-esophageal reflux disease without esophagitis: Secondary | ICD-10-CM

## 2020-09-11 DIAGNOSIS — D23112 Other benign neoplasm of skin of right lower eyelid, including canthus: Secondary | ICD-10-CM | POA: Diagnosis not present

## 2020-09-17 ENCOUNTER — Telehealth: Payer: Self-pay | Admitting: *Deleted

## 2020-09-17 NOTE — Telephone Encounter (Signed)
Contacted patient let him know it has been scheduled. Gave him Forestine Na #.

## 2020-09-17 NOTE — Telephone Encounter (Signed)
Pt called wanting to know if his CT of his chest had been set up. Please call him and let him know this is scheduled at [facility] and we do not schedule these. He can reach out to [facility] for more information

## 2020-09-17 NOTE — Telephone Encounter (Signed)
contact

## 2020-09-19 ENCOUNTER — Other Ambulatory Visit: Payer: Self-pay

## 2020-09-19 ENCOUNTER — Ambulatory Visit (HOSPITAL_COMMUNITY)
Admission: RE | Admit: 2020-09-19 | Discharge: 2020-09-19 | Disposition: A | Discharge status: Home / Self Care | Payer: Medicare Other | Source: Ambulatory Visit | Attending: Internal Medicine | Admitting: Internal Medicine

## 2020-09-19 DIAGNOSIS — J439 Emphysema, unspecified: Secondary | ICD-10-CM | POA: Insufficient documentation

## 2020-09-19 DIAGNOSIS — F17210 Nicotine dependence, cigarettes, uncomplicated: Secondary | ICD-10-CM | POA: Diagnosis not present

## 2020-09-19 DIAGNOSIS — I2510 Atherosclerotic heart disease of native coronary artery without angina pectoris: Secondary | ICD-10-CM | POA: Diagnosis not present

## 2020-09-19 DIAGNOSIS — K760 Fatty (change of) liver, not elsewhere classified: Secondary | ICD-10-CM | POA: Insufficient documentation

## 2020-09-19 DIAGNOSIS — Z122 Encounter for screening for malignant neoplasm of respiratory organs: Secondary | ICD-10-CM | POA: Diagnosis not present

## 2020-11-13 DIAGNOSIS — D23121 Other benign neoplasm of skin of left upper eyelid, including canthus: Secondary | ICD-10-CM | POA: Diagnosis not present

## 2020-11-13 DIAGNOSIS — Z79899 Other long term (current) drug therapy: Secondary | ICD-10-CM | POA: Diagnosis not present

## 2020-11-13 DIAGNOSIS — D23112 Other benign neoplasm of skin of right lower eyelid, including canthus: Secondary | ICD-10-CM | POA: Diagnosis not present

## 2020-11-13 DIAGNOSIS — H02831 Dermatochalasis of right upper eyelid: Secondary | ICD-10-CM | POA: Diagnosis not present

## 2020-11-13 DIAGNOSIS — H18513 Endothelial corneal dystrophy, bilateral: Secondary | ICD-10-CM | POA: Diagnosis not present

## 2020-11-13 DIAGNOSIS — D2339 Other benign neoplasm of skin of other parts of face: Secondary | ICD-10-CM | POA: Diagnosis not present

## 2020-11-13 DIAGNOSIS — D23122 Other benign neoplasm of skin of left lower eyelid, including canthus: Secondary | ICD-10-CM | POA: Diagnosis not present

## 2020-11-13 DIAGNOSIS — H02834 Dermatochalasis of left upper eyelid: Secondary | ICD-10-CM | POA: Diagnosis not present

## 2020-11-13 DIAGNOSIS — D23111 Other benign neoplasm of skin of right upper eyelid, including canthus: Secondary | ICD-10-CM | POA: Diagnosis not present

## 2020-11-26 ENCOUNTER — Telehealth: Payer: Self-pay | Admitting: *Deleted

## 2020-11-26 NOTE — Telephone Encounter (Signed)
LVM to check and see if pt had eye exam done in the last year and if so where was this done at.

## 2020-11-27 NOTE — Progress Notes (Deleted)
Subjective:   Chaney BornRichard L Schweiger is a 55 y.o. male who presents for an Initial Medicare Annual Wellness Visit.  Review of Systems    N/A        Objective:    There were no vitals filed for this visit. There is no height or weight on file to calculate BMI.  Advanced Directives 07/29/2019 07/30/2017 06/22/2017 04/25/2015 03/26/2015  Does Patient Have a Medical Advance Directive? No No No No No  Would patient like information on creating a medical advance directive? No - Patient declined No - Patient declined No - Patient declined - No - patient declined information    Current Medications (verified) Outpatient Encounter Medications as of 11/28/2020  Medication Sig  . atorvastatin (LIPITOR) 40 MG tablet Take 1 tablet (40 mg total) by mouth daily.  . Cholecalciferol 25 MCG (1000 UT) capsule Take by mouth.  Marland Kitchen. HUMIRA PEN 40 MG/0.4ML PNKT SMARTSIG:40 Milligram(s) SUB-Q Every 2 Weeks  . hydroxychloroquine (PLAQUENIL) 200 MG tablet Take 200 mg by mouth 2 (two) times daily.  Marland Kitchen. lisinopril-hydrochlorothiazide (ZESTORETIC) 10-12.5 MG tablet Take 1 tablet by mouth daily.  . metFORMIN (GLUCOPHAGE) 500 MG tablet Take 1 tablet (500 mg total) by mouth 2 (two) times daily.  . Omega-3 Fatty Acids (FISH OIL) 1000 MG CAPS Take 5,000 mg by mouth daily.   Marland Kitchen. omeprazole (PRILOSEC) 40 MG capsule TAKE (1) CAPSULE BY MOUTH ONCE DAILY.  Marland Kitchen. varenicline (CHANTIX) 0.5 MG tablet Take 1 tablet once daily for 3 days and then 1 tablet twice daily for next 4 days.   No facility-administered encounter medications on file as of 11/28/2020.    Allergies (verified) Patient has no known allergies.   History: Past Medical History:  Diagnosis Date  . Depression   . Diabetes mellitus without complication (HCC)   . Rheumatoid arthritis [facility](HCC)    Past Surgical History:  Procedure Laterality Date  . CERVICAL SPINE SURGERY     X 3  . COLONOSCOPY N/A 03/26/2015   Procedure: COLONOSCOPY;  Surgeon: Corbin Adeobert M Rourk, MD;  [facility];  Service: Endoscopy;  Laterality: N/A;  8:00 AM  . TONSILLECTOMY     Family History  Problem Relation Age of Onset  . Stroke Other        undocumented which relative   . High Cholesterol Other        undocumented which relative   . Heart disease Other        undocumented which relative   . Cancer - Other Other        undocumented which relative    Social History   Socioeconomic History  . Marital status: Divorced    Spouse name: Not on file  . Number of children: Not on file  . Years of education: Not on file  . Highest education level: Not on file  Occupational History  . Not on file  Tobacco Use  . Smoking status: Current Some Day Smoker    Packs/day: 1.00    Years: 34.00    Pack years: 34.00    Types: Cigarettes  . Smokeless tobacco: Never Used  Vaping Use  . Vaping Use: Never used  Substance and Sexual Activity  . Alcohol use: No    Alcohol/week: 0.0 standard drinks  . Drug use: Yes    Types: Marijuana    Comment: has not used in couple months   . Sexual activity: Not on file  Other Topics Concern  . Not on file  Social History Narrative   ** Merged History Encounter **       Social Determinants of Health   Financial Resource Strain: Not on file  Food Insecurity: Not on file  Transportation Needs: Not on file  Physical Activity: Not on file  Stress: Not on file  Social Connections: Not on file    Tobacco Counseling Ready to quit: Not Answered Counseling given: Not Answered   Clinical Intake:                 Diabetic?Yes Nutrition Risk Assessment:  Has the patient had any N/V/D within the last 2 months?  {YES/NO:21197} Does the patient have any non-healing wounds?  {YES/NO:21197} Has the patient had any unintentional weight loss or weight gain?  {YES/NO:21197}  Diabetes:  Is the patient diabetic?  Yes  If diabetic, was a CBG obtained today?  No  Did the patient bring in their glucometer from home?  No  How often do  you monitor your CBG's? ***.   Financial Strains and Diabetes Management:  Are you having any financial strains with the device, your supplies or your medication? {YES/NO:21197}.  Does the patient want to be seen by Chronic Care Management for management of their diabetes?  {YES/NO:21197} Would the patient like to be referred to a Nutritionist or for Diabetic Management?  {YES/NO:21197}  Diabetic Exams:  Diabetic Eye Exam: Overdue for diabetic eye exam. Pt has been advised about the importance in completing this exam. Patient advised to call and schedule an eye exam. Diabetic Foot Exam: Completed 05/16/2020          Activities of Daily Living In your present state of health, do you have any difficulty performing the following activities: 04/25/2020  Hearing? N  Vision? N  Difficulty concentrating or making decisions? N  Walking or climbing stairs? N  Dressing or bathing? N  Doing errands, shopping? N  Some recent data might be hidden    Patient Care Team: Lindell Spar, MD as PCP - General (Internal Medicine)  Indicate any recent Medical Services you may have received from other than [facility] providers in the past year (date may be approximate).     Assessment:   This is a routine wellness examination for J[place].  Hearing/Vision screen No exam data present  Dietary issues and exercise activities discussed:    Goals Addressed   None    Depression Screen PHQ 2/9 Scores 08/16/2020 05/16/2020 04/25/2020  PHQ - 2 Score 2 0 1  PHQ- 9 Score 4 - 9    Fall Risk Fall Risk  08/16/2020 05/16/2020 04/25/2020  Falls in the past year? 0 0 0  Number falls in past yr: 0 0 0  Injury with Fall? 0 0 0  Risk for fall due to : No Fall Risks No Fall Risks No Fall Risks  Follow up Falls evaluation completed Falls evaluation completed Falls evaluation completed    [place]:  Any stairs in or around the home? {YES/NO:21197} If so, are there any  without handrails? No  Home free of loose throw rugs in walkways, pet beds, electrical cords, etc? Yes  Adequate lighting in your home to reduce risk of falls? Yes   ASSISTIVE DEVICES UTILIZED TO PREVENT FALLS:  Life alert? {YES/NO:21197} Use of a cane, walker or w/c? {YES/NO:21197} Grab bars in the bathroom? {YES/NO:21197} Shower chair or bench in shower? {YES/NO:21197} Elevated toilet seat or a handicapped toilet? {YES/NO:21197}    Cognitive Function:  Immunizations Immunization History  Administered Date(s) Administered  . Influenza,inj,Quad PF,6+ Mos 04/25/2020  . Influenza-Unspecified 06/27/2018  . PFIZER(Purple Top)SARS-COV-2 Vaccination 10/20/2019, 11/12/2019  . Unspecified SARS-COV-2 Vaccination 07/18/2020    TDAP status: Due, Education has been provided regarding the importance of this vaccine. Advised may receive this vaccine at local pharmacy or Health Dept. Aware to provide a copy of the vaccination record if obtained from local pharmacy or Health Dept. Verbalized acceptance and understanding.  Flu Vaccine status: Up to date  Pneumococcal vaccine status: Up to date  Covid-19 vaccine status: Completed vaccines  Qualifies for Shingles Vaccine? Yes   Zostavax completed No   Shingrix Completed?: No.    Education has been provided regarding the importance of this vaccine. Patient has been advised to call insurance company to determine out of pocket expense if they have not yet received this vaccine. Advised may also receive vaccine at local pharmacy or Health Dept. Verbalized acceptance and understanding.  Screening Tests Health Maintenance  Topic Date Due  . Hepatitis C Screening  Never done  . PNEUMOCOCCAL POLYSACCHARIDE VACCINE AGE 36-64 HIGH RISK  Never done  . OPHTHALMOLOGY EXAM  Never done  . HIV Screening  Never done  . TETANUS/TDAP  Never done  . HEMOGLOBIN A1C  10/23/2020  . INFLUENZA VACCINE  02/25/2021  . FOOT EXAM  05/16/2021  . COLONOSCOPY  (Pts 45-69yrs Insurance coverage will need to be confirmed)  03/25/2025  . COVID-19 Vaccine  Completed  . HPV VACCINES  Aged Out    Health Maintenance  Health Maintenance Due  Topic Date Due  . Hepatitis C Screening  Never done  . PNEUMOCOCCAL POLYSACCHARIDE VACCINE AGE 36-64 HIGH RISK  Never done  . OPHTHALMOLOGY EXAM  Never done  . HIV Screening  Never done  . TETANUS/TDAP  Never done  . HEMOGLOBIN A1C  10/23/2020    Colorectal cancer screening: Type of screening: Colonoscopy. Completed 03/26/2015. Repeat every 10 years  Lung Cancer Screening: (Low Dose CT Chest recommended if Age 8-80 years, 30 pack-year currently smoking OR have quit w/in 15years.) does qualify.   Lung Cancer Screening Referral: ***  Additional Screening:  Hepatitis C Screening: does qualify;   Vision Screening: Recommended annual ophthalmology exams for early detection of glaucoma and other disorders of the eye. Is the patient up to date with their annual eye exam?  {YES/NO:21197} Who is the provider or what is the name of the office in which the patient attends annual eye exams? *** If pt is not established with a provider, would they like to be referred to a provider to establish care? {YES/NO:21197}.   Dental Screening: Recommended annual dental exams for proper oral hygiene  Community Resource Referral / Chronic Care Management: CRR required this visit?  No   CCM required this visit?  No      Plan:     I have personally reviewed and noted the following in the patient's chart:   . Medical and social history . Use of alcohol, tobacco or illicit drugs  . Current medications and supplements including opioid prescriptions. Patient is not currently taking opioid prescriptions. . Functional ability and status . Nutritional status . Physical activity . Advanced directives . List of other physicians . Hospitalizations, surgeries, and ER visits in previous 12 months . Vitals . Screenings to  include cognitive, depression, and falls . Referrals and appointments  In addition, I have reviewed and discussed with patient certain preventive protocols, quality metrics, and best practice recommendations. A written  personalized care plan for preventive services as well as general preventive health recommendations were provided to patient.     Ofilia Neas, LPN   09/30/4654   Nurse Notes: None

## 2020-11-28 ENCOUNTER — Ambulatory Visit: Payer: Medicare Other

## 2020-11-28 ENCOUNTER — Other Ambulatory Visit: Payer: Self-pay

## 2020-11-28 DIAGNOSIS — Z0000 Encounter for general adult medical examination without abnormal findings: Secondary | ICD-10-CM

## 2020-12-05 ENCOUNTER — Other Ambulatory Visit: Payer: Self-pay | Admitting: Internal Medicine

## 2020-12-05 DIAGNOSIS — K219 Gastro-esophageal reflux disease without esophagitis: Secondary | ICD-10-CM

## 2020-12-10 DIAGNOSIS — E119 Type 2 diabetes mellitus without complications: Secondary | ICD-10-CM | POA: Diagnosis not present

## 2020-12-10 DIAGNOSIS — I10 Essential (primary) hypertension: Secondary | ICD-10-CM | POA: Diagnosis not present

## 2020-12-11 LAB — CBC WITH DIFFERENTIAL/PLATELET
Basophils Absolute: 0 10*3/uL (ref 0.0–0.2)
Basos: 1 %
EOS (ABSOLUTE): 0.2 10*3/uL (ref 0.0–0.4)
Eos: 3 %
Hematocrit: 46.6 % (ref 37.5–51.0)
Hemoglobin: 15.6 g/dL (ref 13.0–17.7)
Immature Grans (Abs): 0 10*3/uL (ref 0.0–0.1)
Immature Granulocytes: 0 %
Lymphocytes Absolute: 3.7 10*3/uL — ABNORMAL HIGH (ref 0.7–3.1)
Lymphs: 45 %
MCH: 28.7 pg (ref 26.6–33.0)
MCHC: 33.5 g/dL (ref 31.5–35.7)
MCV: 86 fL (ref 79–97)
Monocytes Absolute: 0.9 10*3/uL (ref 0.1–0.9)
Monocytes: 11 %
Neutrophils Absolute: 3.2 10*3/uL (ref 1.4–7.0)
Neutrophils: 40 %
Platelets: 192 10*3/uL (ref 150–450)
RBC: 5.43 x10E6/uL (ref 4.14–5.80)
RDW: 13.9 % (ref 11.6–15.4)
WBC: 8.1 10*3/uL (ref 3.4–10.8)

## 2020-12-11 LAB — LIPID PANEL
Chol/HDL Ratio: 3.2 ratio (ref 0.0–5.0)
Cholesterol, Total: 140 mg/dL (ref 100–199)
HDL: 44 mg/dL (ref >39)
LDL Chol Calc (NIH): 70 mg/dL (ref 0–99)
Triglycerides: 150 mg/dL — ABNORMAL HIGH (ref 0–149)
VLDL Cholesterol Cal: 26 mg/dL (ref 5–40)

## 2020-12-11 LAB — CMP14+EGFR
ALT: 37 IU/L (ref 0–44)
AST: 34 IU/L (ref 0–40)
Albumin/Globulin Ratio: 1.9 (ref 1.2–2.2)
Albumin: 5 g/dL — ABNORMAL HIGH (ref 3.8–4.9)
Alkaline Phosphatase: 78 IU/L (ref 44–121)
BUN/Creatinine Ratio: 13 (ref 9–20)
BUN: 18 mg/dL (ref 6–24)
Bilirubin Total: 0.4 mg/dL (ref 0.0–1.2)
CO2: 24 mmol/L (ref 20–29)
Calcium: 10.2 mg/dL (ref 8.7–10.2)
Chloride: 98 mmol/L (ref 96–106)
Creatinine, Ser: 1.39 mg/dL — ABNORMAL HIGH (ref 0.76–1.27)
Globulin, Total: 2.7 g/dL (ref 1.5–4.5)
Glucose: 152 mg/dL — ABNORMAL HIGH (ref 65–99)
Potassium: 4.9 mmol/L (ref 3.5–5.2)
Sodium: 140 mmol/L (ref 134–144)
Total Protein: 7.7 g/dL (ref 6.0–8.5)
eGFR: 59 mL/min/{1.73_m2} — ABNORMAL LOW (ref >59)

## 2020-12-11 LAB — TSH: TSH: 2.01 u[IU]/mL (ref 0.450–4.500)

## 2020-12-14 ENCOUNTER — Encounter: Payer: Self-pay | Admitting: Internal Medicine

## 2020-12-14 ENCOUNTER — Ambulatory Visit (INDEPENDENT_AMBULATORY_CARE_PROVIDER_SITE_OTHER): Payer: Medicare Other | Admitting: Internal Medicine

## 2020-12-14 ENCOUNTER — Other Ambulatory Visit: Payer: Self-pay

## 2020-12-14 VITALS — BP 138/81 | HR 81 | Temp 98.0°F | Resp 18 | Ht 72.0 in | Wt 213.4 lb

## 2020-12-14 DIAGNOSIS — Z720 Tobacco use: Secondary | ICD-10-CM

## 2020-12-14 DIAGNOSIS — M0579 Rheumatoid arthritis with rheumatoid factor of multiple sites without organ or systems involvement: Secondary | ICD-10-CM

## 2020-12-14 DIAGNOSIS — Z1159 Encounter for screening for other viral diseases: Secondary | ICD-10-CM

## 2020-12-14 DIAGNOSIS — J439 Emphysema, unspecified: Secondary | ICD-10-CM | POA: Diagnosis not present

## 2020-12-14 DIAGNOSIS — Z125 Encounter for screening for malignant neoplasm of prostate: Secondary | ICD-10-CM

## 2020-12-14 DIAGNOSIS — Z114 Encounter for screening for human immunodeficiency virus [HIV]: Secondary | ICD-10-CM | POA: Diagnosis not present

## 2020-12-14 DIAGNOSIS — I10 Essential (primary) hypertension: Secondary | ICD-10-CM | POA: Diagnosis not present

## 2020-12-14 DIAGNOSIS — K219 Gastro-esophageal reflux disease without esophagitis: Secondary | ICD-10-CM | POA: Diagnosis not present

## 2020-12-14 DIAGNOSIS — E119 Type 2 diabetes mellitus without complications: Secondary | ICD-10-CM | POA: Diagnosis not present

## 2020-12-14 LAB — POCT GLYCOSYLATED HEMOGLOBIN (HGB A1C): HbA1c, POC (controlled diabetic range): 6.5 % (ref 0.0–7.0)

## 2020-12-14 NOTE — Assessment & Plan Note (Signed)
Noted on CT chest Asymptomatic Trying to cut down smoking

## 2020-12-14 NOTE — Assessment & Plan Note (Addendum)
HbA1C: 6.5 On Metformin 500 mg BID Advised to follow diabetic diet On statin and ACEi F/u CMP and HbA1C in next visit Diabetic eye exam: Advised to follow up with Ophthalmology for diabetic eye exam

## 2020-12-14 NOTE — Assessment & Plan Note (Signed)
On Omeprazole 

## 2020-12-14 NOTE — Progress Notes (Signed)
Established Patient Office Visit  Subjective:  Patient ID: Darrell Terry, male    DOB: 10-05-1965  Age: 55 y.o. MRN: [id number]  CC:  Chief Complaint  Patient presents with  . Follow-up    4 month follow up     HPI Darrell Terry is a 55 year old male with PMH of HTN, DM, RA and GERDpresents forfollow up of his blood pressure who presents for follow up of his chronic medical conditions.  BP is well-controlled. Takes medications regularly. Patient denies headache, dizziness, chest pain, dyspnea or palpitations.  He continues to smoke, but is trying to cut down. Did not like Chantix.  His HbA1C was 6.5 in the office today. Takes medications regularly. Denies any polyuria or polydipsia.  He has been taking Plaquenil and Humira for RA. But he states that he is going to discuss about stopping Humira with his Rheumatologist as it is not adding any benefit.    Past Medical History:  Diagnosis Date  . Depression   . Diabetes mellitus without complication ([place])   . Rheumatoid arthritis [facility])     Past Surgical History:  Procedure Laterality Date  . CERVICAL SPINE SURGERY     X 3  . COLONOSCOPY N/A 03/26/2015   Procedure: COLONOSCOPY;  Surgeon: Daneil Dolin, MD;  [facility];  Service: Endoscopy;  Laterality: N/A;  8:00 AM  . TONSILLECTOMY      Family History  Problem Relation Age of Onset  . Stroke Other        undocumented which relative   . High Cholesterol Other        undocumented which relative   . Heart disease Other        undocumented which relative   . Cancer - Other Other        undocumented which relative     Social History   Socioeconomic History  . Marital status: Divorced    Spouse name: Not on file  . Number of children: Not on file  . Years of education: Not on file  . Highest education level: Not on file  Occupational History  . Not on file  Tobacco Use  . Smoking status: Current Some Day Smoker    Packs/day: 1.00     Years: 34.00    Pack years: 34.00    Types: Cigarettes  . Smokeless tobacco: Never Used  Vaping Use  . Vaping Use: Never used  Substance and Sexual Activity  . Alcohol use: No    Alcohol/week: 0.0 standard drinks  . Drug use: Yes    Types: Marijuana    Comment: has not used in couple months   . Sexual activity: Not on file  Other Topics Concern  . Not on file  Social History Narrative   ** Merged History Encounter **       Social Determinants of Health   Financial Resource Strain: Not on file  Food Insecurity: Not on file  Transportation Needs: Not on file  Physical Activity: Not on file  Stress: Not on file  Social Connections: Not on file  Intimate Partner Violence: Not on file    Outpatient Medications Prior to Visit  Medication Sig Dispense Refill  . atorvastatin (LIPITOR) 40 MG tablet Take 1 tablet (40 mg total) by mouth daily. 90 tablet 3  . Cholecalciferol 25 MCG (1000 UT) capsule Take by mouth.    Marland Kitchen HUMIRA PEN 40 MG/0.4ML PNKT SMARTSIG:40 Milligram(s) SUB-Q Every 2 Weeks    .  hydroxychloroquine (PLAQUENIL) 200 MG tablet Take 200 mg by mouth 2 (two) times daily.    Marland Kitchen lisinopril-hydrochlorothiazide (ZESTORETIC) 10-12.5 MG tablet Take 1 tablet by mouth daily. 90 tablet 1  . metFORMIN (GLUCOPHAGE) 500 MG tablet Take 1 tablet (500 mg total) by mouth 2 (two) times daily. 180 tablet 1  . Omega-3 Fatty Acids (FISH OIL) 1000 MG CAPS Take 5,000 mg by mouth daily.     Marland Kitchen omeprazole (PRILOSEC) 40 MG capsule TAKE (1) CAPSULE BY MOUTH ONCE DAILY. 30 capsule 0  . varenicline (CHANTIX) 0.5 MG tablet Take 1 tablet once daily for 3 days and then 1 tablet twice daily for next 4 days. 11 tablet 0   No facility-administered medications prior to visit.    No Known Allergies  ROS Review of Systems  Constitutional: Negative for chills and fever.  HENT: Negative for congestion and sore throat.   Eyes: Negative for pain and discharge.  Respiratory: Negative for cough and shortness of  breath.   Cardiovascular: Negative for chest pain, palpitations and leg swelling.  Gastrointestinal: Negative for constipation, diarrhea, nausea and vomiting.  Endocrine: Negative for polydipsia and polyuria.  Genitourinary: Negative for dysuria and hematuria.  Musculoskeletal: Positive for arthralgias, back pain and neck pain. Negative for neck stiffness.  Skin: Negative for rash.  Neurological: Negative for dizziness, weakness, numbness and headaches.  Psychiatric/Behavioral: Negative for agitation and behavioral problems.      Objective:    Physical Exam Vitals reviewed.  Constitutional:      General: He is not in acute distress.    Appearance: He is not diaphoretic.  HENT:     Head: Normocephalic and atraumatic.     Nose: Nose normal.     Mouth/Throat:     Mouth: Mucous membranes are moist.  Eyes:     General: No scleral icterus.    Extraocular Movements: Extraocular movements intact.  Cardiovascular:     Rate and Rhythm: Normal rate and regular rhythm.     Pulses: Normal pulses.     Heart sounds: Normal heart sounds. No murmur heard. No friction rub. No gallop.   Pulmonary:     Breath sounds: Normal breath sounds. No wheezing or rales.  Abdominal:     Palpations: Abdomen is soft.     Tenderness: There is no abdominal tenderness. There is no guarding or rebound.  Musculoskeletal:        General: No swelling. Normal range of motion.     Cervical back: Neck supple. No tenderness.     Right lower leg: No edema.     Left lower leg: No edema.  Skin:    General: Skin is warm.     Findings: No rash.  Neurological:     General: No focal deficit present.     Mental Status: He is alert and oriented to person, place, and time.     Sensory: No sensory deficit.     Motor: No weakness.  Psychiatric:        Mood and Affect: Mood normal.        Behavior: Behavior normal.     BP 138/81 (BP Location: Left Arm, Patient Position: Sitting, Cuff Size: Normal)   Pulse 81   Temp  98 F (36.7 C) (Oral)   Resp 18   Ht 6' (1.829 m)   Wt 213 lb 6.4 oz (96.8 kg)   SpO2 99%   BMI 28.94 kg/m  Wt Readings from Last 3 Encounters:  12/14/20 213 lb 6.4 oz (  96.8 kg)  08/16/20 216 lb (98 kg)  05/16/20 208 lb 6.4 oz (94.5 kg)     Health Maintenance Due  Topic Date Due  . PNEUMOCOCCAL POLYSACCHARIDE VACCINE AGE 36-64 HIGH RISK  Never done  . OPHTHALMOLOGY EXAM  Never done  . Hepatitis C Screening  Never done  . TETANUS/TDAP  Never done    There are no preventive care reminders to display for this patient.  Lab Results  Component Value Date   TSH 2.010 12/10/2020   Lab Results  Component Value Date   WBC 8.1 12/10/2020   HGB 15.6 12/10/2020   HCT 46.6 12/10/2020   MCV 86 12/10/2020   PLT 192 12/10/2020   Lab Results  Component Value Date   NA 140 12/10/2020   K 4.9 12/10/2020   CO2 24 12/10/2020   GLUCOSE 152 (H) 12/10/2020   BUN 18 12/10/2020   CREATININE 1.39 (H) 12/10/2020   BILITOT 0.4 12/10/2020   ALKPHOS 78 12/10/2020   AST 34 12/10/2020   ALT 37 12/10/2020   PROT 7.7 12/10/2020   ALBUMIN 5.0 (H) 12/10/2020   CALCIUM 10.2 12/10/2020   EGFR 59 (L) 12/10/2020   Lab Results  Component Value Date   CHOL 140 12/10/2020   Lab Results  Component Value Date   HDL 44 12/10/2020   Lab Results  Component Value Date   LDLCALC 70 12/10/2020   Lab Results  Component Value Date   TRIG 150 (H) 12/10/2020   Lab Results  Component Value Date   CHOLHDL 3.2 12/10/2020   Lab Results  Component Value Date   HGBA1C 6.5 12/14/2020      Assessment & Plan:   Problem List Items Addressed This Visit      Cardiovascular and Mediastinum   Hypertension    BP Readings from Last 1 Encounters:  12/14/20 138/81   Well-controlled with Lisinopril-HCTZ now Counseled for compliance with the medications Advised DASH diet and moderate exercise/walking, at least 150 mins/week         Respiratory   Pulmonary emphysema ([place])    Noted on CT  chest Asymptomatic Trying to cut down smoking        Digestive   GERD (gastroesophageal reflux disease)    On Omeprazole        Endocrine   DM (diabetes mellitus) ([place]) - Primary    HbA1C: 6.5 On Metformin 500 mg BID Advised to follow diabetic diet On statin and ACEi F/u CMP and HbA1C in next visit Diabetic eye exam: Advised to follow up with Ophthalmology for diabetic eye exam       Relevant Orders   POCT glycosylated hemoglobin (Hb A1C) (Completed)   CBC   CMP14+EGFR   Urinalysis     Musculoskeletal and Integument   Rheumatoid arthritis involving multiple sites with positive rheumatoid factor (HCC)    On Plaquenil and Humira Follows up with Rheumatologist Advised eye exam as he takes Plaquenil        Other   Tobacco abuse    Smokes about 1 pack/day.  Asked about quitting: confirms that he currently smokes cigarettes Advise to quit smoking: Educated about QUITTING to reduce the risk of cancer, cardio and cerebrovascular disease. Assess willingness: Unwilling to quit at this time, but is working on cutting back. Assist with counseling and pharmacotherapy: Counseled for 5 minutes and literature provided.  Arrange for follow up: follow up in 3 months and continue to offer help.       Other Visit  Diagnoses    Screening for prostate cancer       Relevant Orders   PSA   Need for hepatitis C screening test       Relevant Orders   Hepatitis C Antibody   Encounter for screening for HIV       Relevant Orders   HIV antibody (with reflex)      No orders of the defined types were placed in this encounter.   Follow-up: Return in about 4 months (around 04/16/2021) for Annual physical.    Lindell Spar, MD

## 2020-12-14 NOTE — Patient Instructions (Addendum)
Please continue to take medications as prescribed.  Please continue to follow DASH diet and perform moderate exercise/walking at least 150 mins/week.  Please cut down -> quit smoking.

## 2020-12-14 NOTE — Assessment & Plan Note (Signed)
On Plaquenil and Humira Follows up with Rheumatologist Advised eye exam as he takes Plaquenil

## 2020-12-14 NOTE — Assessment & Plan Note (Signed)
Smokes about 1 pack/day  Asked about quitting: confirms that he currently smokes cigarettes Advise to quit smoking: Educated about QUITTING to reduce the risk of cancer, cardio and cerebrovascular disease. Assess willingness: Unwilling to quit at this time, but is working on cutting back. Assist with counseling and pharmacotherapy: Counseled for 5 minutes and literature provided. Arrange for follow up: follow up in 3 months and continue to offer help.  

## 2020-12-14 NOTE — Assessment & Plan Note (Signed)
BP Readings from Last 1 Encounters:  12/14/20 138/81   Well-controlled with Lisinopril-HCTZ now Counseled for compliance with the medications Advised DASH diet and moderate exercise/walking, at least 150 mins/week

## 2020-12-20 ENCOUNTER — Encounter: Payer: Self-pay | Admitting: Internal Medicine

## 2020-12-20 ENCOUNTER — Other Ambulatory Visit: Payer: Self-pay | Admitting: *Deleted

## 2020-12-20 MED ORDER — METFORMIN HCL 500 MG PO TABS: 500.0000 mg | ORAL_TABLET | Freq: Two times a day (BID) | ORAL | 1 refills | Status: DC

## 2021-01-08 ENCOUNTER — Telehealth: Payer: Self-pay | Admitting: Internal Medicine

## 2021-01-08 NOTE — Telephone Encounter (Signed)
Left message for patient to call back and schedule Medicare Annual Wellness Visit (AWV) either virtually or in office.   AWV-I PER [place] 06/27/2010/[facility]  please schedule at anytime with [facility]  health coach  This should be a 40 minute visit.

## 2021-01-11 ENCOUNTER — Other Ambulatory Visit: Payer: Self-pay | Admitting: Internal Medicine

## 2021-01-11 DIAGNOSIS — K219 Gastro-esophageal reflux disease without esophagitis: Secondary | ICD-10-CM

## 2021-02-08 DIAGNOSIS — K760 Fatty (change of) liver, not elsewhere classified: Secondary | ICD-10-CM | POA: Diagnosis not present

## 2021-02-08 DIAGNOSIS — M0579 Rheumatoid arthritis with rheumatoid factor of multiple sites without organ or systems involvement: Secondary | ICD-10-CM | POA: Diagnosis not present

## 2021-02-08 DIAGNOSIS — R221 Localized swelling, mass and lump, neck: Secondary | ICD-10-CM | POA: Diagnosis not present

## 2021-02-08 DIAGNOSIS — R945 Abnormal results of liver function studies: Secondary | ICD-10-CM | POA: Diagnosis not present

## 2021-02-08 DIAGNOSIS — E785 Hyperlipidemia, unspecified: Secondary | ICD-10-CM | POA: Diagnosis not present

## 2021-02-08 DIAGNOSIS — M1990 Unspecified osteoarthritis, unspecified site: Secondary | ICD-10-CM | POA: Diagnosis not present

## 2021-02-08 DIAGNOSIS — R590 Localized enlarged lymph nodes: Secondary | ICD-10-CM | POA: Diagnosis not present

## 2021-02-08 DIAGNOSIS — M069 Rheumatoid arthritis, unspecified: Secondary | ICD-10-CM | POA: Diagnosis not present

## 2021-02-08 DIAGNOSIS — Z79899 Other long term (current) drug therapy: Secondary | ICD-10-CM | POA: Diagnosis not present

## 2021-02-14 DIAGNOSIS — R590 Localized enlarged lymph nodes: Secondary | ICD-10-CM | POA: Diagnosis not present

## 2021-02-25 ENCOUNTER — Other Ambulatory Visit: Payer: Self-pay | Admitting: Internal Medicine

## 2021-02-25 DIAGNOSIS — K219 Gastro-esophageal reflux disease without esophagitis: Secondary | ICD-10-CM

## 2021-03-12 DIAGNOSIS — Z20822 Contact with and (suspected) exposure to covid-19: Secondary | ICD-10-CM | POA: Diagnosis not present

## 2021-03-31 DIAGNOSIS — Z20822 Contact with and (suspected) exposure to covid-19: Secondary | ICD-10-CM | POA: Diagnosis not present

## 2021-04-01 ENCOUNTER — Other Ambulatory Visit: Payer: Self-pay | Admitting: Internal Medicine

## 2021-04-01 DIAGNOSIS — E785 Hyperlipidemia, unspecified: Secondary | ICD-10-CM

## 2021-04-01 DIAGNOSIS — I10 Essential (primary) hypertension: Secondary | ICD-10-CM

## 2021-04-01 DIAGNOSIS — K219 Gastro-esophageal reflux disease without esophagitis: Secondary | ICD-10-CM

## 2021-04-10 DIAGNOSIS — Z1159 Encounter for screening for other viral diseases: Secondary | ICD-10-CM | POA: Diagnosis not present

## 2021-04-10 DIAGNOSIS — E119 Type 2 diabetes mellitus without complications: Secondary | ICD-10-CM | POA: Diagnosis not present

## 2021-04-10 DIAGNOSIS — Z125 Encounter for screening for malignant neoplasm of prostate: Secondary | ICD-10-CM | POA: Diagnosis not present

## 2021-04-10 DIAGNOSIS — Z114 Encounter for screening for human immunodeficiency virus [HIV]: Secondary | ICD-10-CM | POA: Diagnosis not present

## 2021-04-11 LAB — CMP14+EGFR
ALT: 36 IU/L (ref 0–44)
AST: 29 IU/L (ref 0–40)
Albumin/Globulin Ratio: 1.8 (ref 1.2–2.2)
Albumin: 4.8 g/dL (ref 3.8–4.9)
Alkaline Phosphatase: 91 IU/L (ref 44–121)
BUN/Creatinine Ratio: 14 (ref 9–20)
BUN: 13 mg/dL (ref 6–24)
Bilirubin Total: 0.3 mg/dL (ref 0.0–1.2)
CO2: 23 mmol/L (ref 20–29)
Calcium: 9.8 mg/dL (ref 8.7–10.2)
Chloride: 97 mmol/L (ref 96–106)
Creatinine, Ser: 0.92 mg/dL (ref 0.76–1.27)
Globulin, Total: 2.7 g/dL (ref 1.5–4.5)
Glucose: 160 mg/dL — ABNORMAL HIGH (ref 65–99)
Potassium: 4.2 mmol/L (ref 3.5–5.2)
Sodium: 139 mmol/L (ref 134–144)
Total Protein: 7.5 g/dL (ref 6.0–8.5)
eGFR: 98 mL/min/{1.73_m2} (ref >59)

## 2021-04-11 LAB — URINALYSIS
Bilirubin, UA: NEGATIVE
Glucose, UA: NEGATIVE
Ketones, UA: NEGATIVE
Leukocytes,UA: NEGATIVE
Nitrite, UA: NEGATIVE
Protein,UA: NEGATIVE
RBC, UA: NEGATIVE
Specific Gravity, UA: 1.014 (ref 1.005–1.030)
Urobilinogen, Ur: 0.2 mg/dL (ref 0.2–1.0)
pH, UA: 6 (ref 5.0–7.5)

## 2021-04-11 LAB — CBC
Hematocrit: 48 % (ref 37.5–51.0)
Hemoglobin: 15.8 g/dL (ref 13.0–17.7)
MCH: 28.4 pg (ref 26.6–33.0)
MCHC: 32.9 g/dL (ref 31.5–35.7)
MCV: 86 fL (ref 79–97)
Platelets: 180 10*3/uL (ref 150–450)
RBC: 5.57 x10E6/uL (ref 4.14–5.80)
RDW: 12.7 % (ref 11.6–15.4)
WBC: 8.4 10*3/uL (ref 3.4–10.8)

## 2021-04-11 LAB — HEPATITIS C ANTIBODY: Hep C Virus Ab: <0.1 s/co ratio (ref 0.0–0.9)

## 2021-04-11 LAB — HIV ANTIBODY (ROUTINE TESTING W REFLEX): HIV Screen 4th Generation wRfx: NONREACTIVE

## 2021-04-11 LAB — PSA: Prostate Specific Ag, Serum: 1.9 ng/mL (ref 0.0–4.0)

## 2021-04-16 ENCOUNTER — Other Ambulatory Visit: Payer: Self-pay

## 2021-04-16 ENCOUNTER — Encounter: Payer: Self-pay | Admitting: Internal Medicine

## 2021-04-16 ENCOUNTER — Ambulatory Visit (INDEPENDENT_AMBULATORY_CARE_PROVIDER_SITE_OTHER): Payer: Medicare Other | Admitting: Internal Medicine

## 2021-04-16 VITALS — BP 132/74 | HR 84 | Temp 98.5°F | Resp 18 | Ht 72.0 in | Wt 216.0 lb

## 2021-04-16 DIAGNOSIS — I10 Essential (primary) hypertension: Secondary | ICD-10-CM | POA: Diagnosis not present

## 2021-04-16 DIAGNOSIS — E119 Type 2 diabetes mellitus without complications: Secondary | ICD-10-CM | POA: Diagnosis not present

## 2021-04-16 DIAGNOSIS — M0579 Rheumatoid arthritis with rheumatoid factor of multiple sites without organ or systems involvement: Secondary | ICD-10-CM | POA: Diagnosis not present

## 2021-04-16 DIAGNOSIS — Z720 Tobacco use: Secondary | ICD-10-CM

## 2021-04-16 DIAGNOSIS — F17210 Nicotine dependence, cigarettes, uncomplicated: Secondary | ICD-10-CM

## 2021-04-16 DIAGNOSIS — Z23 Encounter for immunization: Secondary | ICD-10-CM

## 2021-04-16 LAB — POCT GLYCOSYLATED HEMOGLOBIN (HGB A1C): HbA1c, POC (controlled diabetic range): 6.9 % (ref 0.0–7.0)

## 2021-04-16 NOTE — Patient Instructions (Addendum)
Please continue to take medications as prescribed.  Please follow low carb diet and perform moderate exercise/walking at least 150 mins/week.  Please consider getting Shingrix vaccine at a local pharmacy.

## 2021-04-16 NOTE — Assessment & Plan Note (Signed)
Lab Results  Component Value Date   HGBA1C 6.9 04/16/2021   On Metformin 500 mg BID Advised to follow diabetic diet, needs to cut down soda intake If HbA1C elevates >7, will add another agent On statin and ACEi F/u HbA1C in next visit Diabetic eye exam: Advised to follow up with Ophthalmology for diabetic eye exam

## 2021-04-16 NOTE — Assessment & Plan Note (Signed)
BP Readings from Last 1 Encounters:  04/16/21 132/74   Well-controlled with Lisinopril-HCTZ now Counseled for compliance with the medications Advised DASH diet and moderate exercise/walking, at least 150 mins/week

## 2021-04-16 NOTE — Assessment & Plan Note (Signed)
Smokes about 1 pack/day.  Asked about quitting: confirms that he currently smokes cigarettes Advise to quit smoking: Educated about QUITTING to reduce the risk of cancer, cardio and cerebrovascular disease. Assess willingness: Unwilling to quit at this time, but is working on cutting back. Assist with counseling and pharmacotherapy: Counseled for 5 minutes and literature provided. Did not like Chantix in the past. Arrange for follow up: follow up in 3 months and continue to offer help. 

## 2021-04-16 NOTE — Progress Notes (Signed)
Established Patient Office Visit  Subjective:  Patient ID: Darrell Terry, male    DOB: 11/19/1965  Age: 55 y.o. MRN: [id number]  CC:  Chief Complaint  Patient presents with   Diabetes   Hypertension    HPI Darrell Terry is a 55 year old male with PMH of HTN, DM, RA and GERD presents for follow up of his blood pressure who presents for follow up of his chronic medical conditions.   BP is well-controlled. Takes medications regularly. Patient denies headache, dizziness, chest pain, dyspnea or palpitations.    His HbA1C was 6.9 in the office today. Takes medications regularly. He admits that he needs to cut down soda intake. Denies any polyuria or polydipsia.   He has been taking Plaquenil for RA. But he states that he is going to discuss about stopping Humira with his Rheumatologist as it is not adding any benefit.  He continues to smoke, but is trying to cut down. Did not like Chantix.  He received flu vaccine in the office today.  Past Medical History:  Diagnosis Date   Depression    Diabetes mellitus without complication ([place])    Rheumatoid arthritis ([place])     Past Surgical History:  Procedure Laterality Date   CERVICAL SPINE SURGERY     X 3   COLONOSCOPY N/A 03/26/2015   Procedure: COLONOSCOPY;  Surgeon: Daneil Dolin, MD;  Location: [facility];  Service: Endoscopy;  Laterality: N/A;  8:00 AM   TONSILLECTOMY      Family History  Problem Relation Age of Onset   Stroke Other        undocumented which relative    High Cholesterol Other        undocumented which relative    Heart disease Other        undocumented which relative    Cancer - Other Other        undocumented which relative     Social History   Socioeconomic History   Marital status: Divorced    Spouse name: Not on file   Number of children: Not on file   Years of education: Not on file   Highest education level: Not on file  Occupational History   Not on file  Tobacco Use   Smoking  status: Some Days    Packs/day: 1.00    Years: 34.00    Pack years: 34.00    Types: Cigarettes   Smokeless tobacco: Never  Vaping Use   Vaping Use: Never used  Substance and Sexual Activity   Alcohol use: No    Alcohol/week: 0.0 standard drinks   Drug use: Yes    Types: Marijuana    Comment: has not used in couple months    Sexual activity: Not on file  Other Topics Concern   Not on file  Social History Narrative   ** Merged History Encounter **       Social Determinants of Health   Financial Resource Strain: Not on file  Food Insecurity: Not on file  Transportation Needs: Not on file  Physical Activity: Not on file  Stress: Not on file  Social Connections: Not on file  Intimate Partner Violence: Not on file    Outpatient Medications Prior to Visit  Medication Sig Dispense Refill   atorvastatin (LIPITOR) 40 MG tablet TAKE 1 TABLET BY MOUTH ONCE DAILY. 90 tablet 0   Cholecalciferol 25 MCG (1000 UT) capsule Take by mouth.     HUMIRA PEN 40 MG/0.4ML  PNKT SMARTSIG:40 Milligram(s) SUB-Q Every 2 Weeks     hydroxychloroquine (PLAQUENIL) 200 MG tablet Take 200 mg by mouth 2 (two) times daily.     lisinopril-hydrochlorothiazide (ZESTORETIC) 10-12.5 MG tablet TAKE (1) TABLET BY MOUTH ONCE DAILY. 90 tablet 0   metFORMIN (GLUCOPHAGE) 500 MG tablet Take 1 tablet (500 mg total) by mouth 2 (two) times daily. 180 tablet 1   Omega-3 Fatty Acids (FISH OIL) 1000 MG CAPS Take 5,000 mg by mouth daily.      omeprazole (PRILOSEC) 40 MG capsule TAKE (1) CAPSULE BY MOUTH ONCE DAILY. 30 capsule 0   varenicline (CHANTIX) 0.5 MG tablet Take 1 tablet once daily for 3 days and then 1 tablet twice daily for next 4 days. 11 tablet 0   No facility-administered medications prior to visit.    No Known Allergies  ROS Review of Systems  Constitutional:  Negative for chills and fever.  HENT:  Negative for congestion and sore throat.   Eyes:  Negative for pain and discharge.  Respiratory:  Negative  for cough and shortness of breath.   Cardiovascular:  Negative for chest pain, palpitations and leg swelling.  Gastrointestinal:  Negative for constipation, diarrhea, nausea and vomiting.  Endocrine: Negative for polydipsia and polyuria.  Genitourinary:  Negative for dysuria and hematuria.  Musculoskeletal:  Positive for arthralgias, back pain and neck pain. Negative for neck stiffness.  Skin:  Negative for rash.  Neurological:  Negative for dizziness, weakness, numbness and headaches.  Psychiatric/Behavioral:  Negative for agitation and behavioral problems.      Objective:    Physical Exam Vitals reviewed.  Constitutional:      General: He is not in acute distress.    Appearance: He is not diaphoretic.  HENT:     Head: Normocephalic and atraumatic.     Nose: Nose normal.     Mouth/Throat:     Mouth: Mucous membranes are moist.  Eyes:     General: No scleral icterus.    Extraocular Movements: Extraocular movements intact.  Cardiovascular:     Rate and Rhythm: Normal rate and regular rhythm.     Pulses: Normal pulses.     Heart sounds: Normal heart sounds. No murmur heard.   No friction rub. No gallop.  Pulmonary:     Breath sounds: Normal breath sounds. No wheezing or rales.  Abdominal:     Palpations: Abdomen is soft.     Tenderness: There is no abdominal tenderness. There is no guarding or rebound.  Musculoskeletal:        General: No swelling. Normal range of motion.     Cervical back: Neck supple. No tenderness.     Right lower leg: No edema.     Left lower leg: No edema.  Skin:    General: Skin is warm.     Findings: No rash.  Neurological:     General: No focal deficit present.     Mental Status: He is alert and oriented to person, place, and time.     Sensory: No sensory deficit.     Motor: No weakness.  Psychiatric:        Mood and Affect: Mood normal.        Behavior: Behavior normal.    BP 132/74 (BP Location: Left Arm, Cuff Size: Normal)   Pulse 84    Temp 98.5 F (36.9 C) (Oral)   Resp 18   Ht 6' (1.829 m)   Wt 216 lb (98 kg)   SpO2 97%  BMI 29.29 kg/m  Wt Readings from Last 3 Encounters:  04/16/21 216 lb (98 kg)  12/14/20 213 lb 6.4 oz (96.8 kg)  08/16/20 216 lb (98 kg)     Health Maintenance Due  Topic Date Due   OPHTHALMOLOGY EXAM  Never done   TETANUS/TDAP  Never done   Zoster Vaccines- Shingrix (1 of 2) Never done   COVID-19 Vaccine (4 - Booster) 10/10/2020    There are no preventive care reminders to display for this patient.  Lab Results  Component Value Date   TSH 2.010 12/10/2020   Lab Results  Component Value Date   WBC 8.4 04/10/2021   HGB 15.8 04/10/2021   HCT 48.0 04/10/2021   MCV 86 04/10/2021   PLT 180 04/10/2021   Lab Results  Component Value Date   NA 139 04/10/2021   K 4.2 04/10/2021   CO2 23 04/10/2021   GLUCOSE 160 (H) 04/10/2021   BUN 13 04/10/2021   CREATININE 0.92 04/10/2021   BILITOT 0.3 04/10/2021   ALKPHOS 91 04/10/2021   AST 29 04/10/2021   ALT 36 04/10/2021   PROT 7.5 04/10/2021   ALBUMIN 4.8 04/10/2021   CALCIUM 9.8 04/10/2021   EGFR 98 04/10/2021   Lab Results  Component Value Date   CHOL 140 12/10/2020   Lab Results  Component Value Date   HDL 44 12/10/2020   Lab Results  Component Value Date   LDLCALC 70 12/10/2020   Lab Results  Component Value Date   TRIG 150 (H) 12/10/2020   Lab Results  Component Value Date   CHOLHDL 3.2 12/10/2020   Lab Results  Component Value Date   HGBA1C 6.9 04/16/2021      Assessment & Plan:   Problem List Items Addressed This Visit       Cardiovascular and Mediastinum   Hypertension    BP Readings from Last 1 Encounters:  04/16/21 132/74  Well-controlled with Lisinopril-HCTZ now Counseled for compliance with the medications Advised DASH diet and moderate exercise/walking, at least 150 mins/week        Endocrine   DM (diabetes mellitus) ([place]) - Primary    Lab Results  Component Value Date   HGBA1C 6.9  04/16/2021  On Metformin 500 mg BID Advised to follow diabetic diet, needs to cut down soda intake If HbA1C elevates >7, will add another agent On statin and ACEi F/u HbA1C in next visit Diabetic eye exam: Advised to follow up with Ophthalmology for diabetic eye exam       Relevant Orders   POCT glycosylated hemoglobin (Hb A1C) (Completed)     Musculoskeletal and Integument   Rheumatoid arthritis involving multiple sites with positive rheumatoid factor (HCC)    On Plaquenil and Humira Follows up with Rheumatologist Advised eye exam as he takes Plaquenil        Other   Tobacco abuse    Smokes about 1 pack/day.  Asked about quitting: confirms that he currently smokes cigarettes Advise to quit smoking: Educated about QUITTING to reduce the risk of cancer, cardio and cerebrovascular disease. Assess willingness: Unwilling to quit at this time, but is working on cutting back. Assist with counseling and pharmacotherapy: Counseled for 5 minutes and literature provided. Did not like Chantix in the past. Arrange for follow up: follow up in 3 months and continue to offer help.      Other Visit Diagnoses     Need for immunization against influenza       Relevant Orders  Flu Vaccine QUAD 62moIM (Fluarix, Fluzone & Alfiuria Quad PF) (Completed)       No orders of the defined types were placed in this encounter.   Follow-up: Return in about 4 months (around 08/16/2021) for DM.    RLindell Spar MD

## 2021-04-16 NOTE — Assessment & Plan Note (Signed)
On Plaquenil and Humira Follows up with Rheumatologist Advised eye exam as he takes Plaquenil

## 2021-04-19 ENCOUNTER — Ambulatory Visit (INDEPENDENT_AMBULATORY_CARE_PROVIDER_SITE_OTHER): Payer: Medicare Other

## 2021-04-19 ENCOUNTER — Other Ambulatory Visit: Payer: Self-pay

## 2021-04-19 DIAGNOSIS — Z0000 Encounter for general adult medical examination without abnormal findings: Secondary | ICD-10-CM | POA: Diagnosis not present

## 2021-04-19 NOTE — Patient Instructions (Signed)
Health Maintenance, Male Adopting a healthy lifestyle and getting preventive care are important in promoting health and wellness. Ask your health care provider about: The right schedule for you to have regular tests and exams. Things you can do on your own to prevent diseases and keep yourself healthy. What should I know about diet, weight, and exercise? Eat a healthy diet  Eat a diet that includes plenty of vegetables, fruits, low-fat dairy products, and lean protein. Do not eat a lot of foods that are high in solid fats, added sugars, or sodium. Maintain a healthy weight Body mass index (BMI) is a measurement that can be used to identify possible weight problems. It estimates body fat based on height and weight. Your health care provider can help determine your BMI and help you achieve or maintain a healthy weight. Get regular exercise Get regular exercise. This is one of the most important things you can do for your health. Most adults should: Exercise for at least 150 minutes each week. The exercise should increase your heart rate and make you sweat (moderate-intensity exercise). Do strengthening exercises at least twice a week. This is in addition to the moderate-intensity exercise. Spend less time sitting. Even light physical activity can be beneficial. Watch cholesterol and blood lipids Have your blood tested for lipids and cholesterol at 57 years of age, then have this test every 5 years. You may need to have your cholesterol levels checked more often if: Your lipid or cholesterol levels are high. You are older than 57 years of age. You are at high risk for heart disease. What should I know about cancer screening? Many types of cancers can be detected early and may often be prevented. Depending on your health history and family history, you may need to have cancer screening at various ages. This may include screening for: Colorectal cancer. Prostate cancer. Skin cancer. Lung  cancer. What should I know about heart disease, diabetes, and high blood pressure? Blood pressure and heart disease High blood pressure causes heart disease and increases the risk of stroke. This is more likely to develop in people who have high blood pressure readings, are of African descent, or are overweight. Talk with your health care provider about your target blood pressure readings. Have your blood pressure checked: Every 3-5 years if you are 18-39 years of age. Every year if you are 40 years old or older. If you are between the ages of 65 and 75 and are a current or former smoker, ask your health care provider if you should have a one-time screening for abdominal aortic aneurysm (AAA). Diabetes Have regular diabetes screenings. This checks your fasting blood sugar level. Have the screening done: Once every three years after age 45 if you are at a normal weight and have a low risk for diabetes. More often and at a younger age if you are overweight or have a high risk for diabetes. What should I know about preventing infection? Hepatitis B If you have a higher risk for hepatitis B, you should be screened for this virus. Talk with your health care provider to find out if you are at risk for hepatitis B infection. Hepatitis C Blood testing is recommended for: Everyone born from 1945 through 1965. Anyone with known risk factors for hepatitis C. Sexually transmitted infections (STIs) You should be screened each year for STIs, including gonorrhea and chlamydia, if: You are sexually active and are younger than 57 years of age. You are older than 57 years   of age and your health care provider tells you that you are at risk for this type of infection. Your sexual activity has changed since you were last screened, and you are at increased risk for chlamydia or gonorrhea. Ask your health care provider if you are at risk. Ask your health care provider about whether you are at high risk for HIV.  Your health care provider may recommend a prescription medicine to help prevent HIV infection. If you choose to take medicine to prevent HIV, you should first get tested for HIV. You should then be tested every 3 months for as long as you are taking the medicine. Follow these instructions at home: Lifestyle Do not use any products that contain nicotine or tobacco, such as cigarettes, e-cigarettes, and chewing tobacco. If you need help quitting, ask your health care provider. Do not use street drugs. Do not share needles. Ask your health care provider for help if you need support or information about quitting drugs. Alcohol use Do not drink alcohol if your health care provider tells you not to drink. If you drink alcohol: Limit how much you have to 0-2 drinks a day. Be aware of how much alcohol is in your drink. In the [place] one drink equals one 12 oz bottle of beer (355 mL), one 5 oz glass of wine (148 mL), or one 1 oz glass of hard liquor (44 mL). General instructions Schedule regular health, dental, and eye exams. Stay current with your vaccines. Tell your health care provider if: You often feel depressed. You have ever been abused or do not feel safe at home. Summary Adopting a healthy lifestyle and getting preventive care are important in promoting health and wellness. Follow your health care provider's instructions about healthy diet, exercising, and getting tested or screened for diseases. Follow your health care provider's instructions on monitoring your cholesterol and blood pressure. This information is not intended to replace advice given to you by your health care provider. Make sure you discuss any questions you have with your health care provider. Document Revised: 09/21/2020 Document Reviewed: 07/07/2018 [employer] Patient Education  2022 [employer]  

## 2021-04-19 NOTE — Progress Notes (Signed)
Subjective:   Darrell Terry is a 55 y.o. male who presents for an Initial Medicare Annual Wellness Visit. I connected with  Amador Cunas on 04/19/21 by a audio enabled telemedicine application and verified that I am speaking with the correct person using two identifiers.   I discussed the limitations of evaluation and management by telemedicine. The patient expressed understanding and agreed to proceed.   Location of patient:Home  Location of Provider:Office  Persons participating in virtual visit: Darrell Terry (patient) and Valli Glance  Review of Systems    Defer to PCP       Objective:    Today's Vitals   04/19/21 1612  PainSc: 3    There is no height or weight on file to calculate BMI.  Advanced Directives 04/19/2021 07/29/2019 07/30/2017 06/22/2017 04/25/2015 03/26/2015  Does Patient Have a Medical Advance Directive? No No No No No No  Would patient like information on creating a medical advance directive? - No - Patient declined No - Patient declined No - Patient declined - No - patient declined information    Current Medications (verified) Outpatient Encounter Medications as of 04/19/2021  Medication Sig   atorvastatin (LIPITOR) 40 MG tablet TAKE 1 TABLET BY MOUTH ONCE DAILY.   Cholecalciferol 25 MCG (1000 UT) capsule Take by mouth.   HUMIRA PEN 40 MG/0.4ML PNKT SMARTSIG:40 Milligram(s) SUB-Q Every 2 Weeks   hydroxychloroquine (PLAQUENIL) 200 MG tablet Take 200 mg by mouth 2 (two) times daily.   lisinopril-hydrochlorothiazide (ZESTORETIC) 10-12.5 MG tablet TAKE (1) TABLET BY MOUTH ONCE DAILY.   metFORMIN (GLUCOPHAGE) 500 MG tablet Take 1 tablet (500 mg total) by mouth 2 (two) times daily.   Omega-3 Fatty Acids (FISH OIL) 1000 MG CAPS Take 5,000 mg by mouth daily.    omeprazole (PRILOSEC) 40 MG capsule TAKE (1) CAPSULE BY MOUTH ONCE DAILY.   No facility-administered encounter medications on file as of 04/19/2021.    Allergies (verified) Patient has no known  allergies.   History: Past Medical History:  Diagnosis Date   Depression    Diabetes mellitus without complication ([place])    Rheumatoid arthritis ([place])    Past Surgical History:  Procedure Laterality Date   CERVICAL SPINE SURGERY     X 3   COLONOSCOPY N/A 03/26/2015   Procedure: COLONOSCOPY;  Surgeon: Daneil Dolin, MD;  [facility];  Service: Endoscopy;  Laterality: N/A;  8:00 AM   TONSILLECTOMY     Family History  Problem Relation Age of Onset   Stroke Other        undocumented which relative    High Cholesterol Other        undocumented which relative    Heart disease Other        undocumented which relative    Cancer - Other Other        undocumented which relative    Social History   Socioeconomic History   Marital status: Divorced    Spouse name: Not on file   Number of children: Not on file   Years of education: Not on file   Highest education level: Not on file  Occupational History   Not on file  Tobacco Use   Smoking status: Some Days    Packs/day: 1.00    Years: 34.00    Pack years: 34.00    Types: Cigarettes   Smokeless tobacco: Never  Vaping Use   Vaping Use: Never used  Substance and Sexual Activity   Alcohol  use: No    Alcohol/week: 0.0 standard drinks   Drug use: Yes    Types: Marijuana    Comment: has not used in couple months    Sexual activity: Not on file  Other Topics Concern   Not on file  Social History Narrative   ** Merged History Encounter **       Social Determinants of Health   Financial Resource Strain: Medium Risk   Difficulty of Paying Living Expenses: Somewhat hard  Food Insecurity: No Food Insecurity   Worried About Charity fundraiser in the Last Year: Never true   Ran Out of Food in the Last Year: Never true  Transportation Needs: No Transportation Needs   Lack of Transportation (Medical): No   Lack of Transportation (Non-Medical): No  Physical Activity: Inactive   Days of Exercise per Week: 0 days    Minutes of Exercise per Session: 0 min  Stress: Stress Concern Present   Feeling of Stress : To some extent  Social Connections: Socially Isolated   Frequency of Communication with Friends and Family: Twice a week   Frequency of Social Gatherings with Friends and Family: Never   Attends Religious Services: Never   Printmaker: No   Attends Music therapist: Never   Marital Status: Divorced    Tobacco Counseling Ready to quit: Not Answered Counseling given: Not Answered   Clinical Intake:  Pre-visit preparation completed: Yes  Pain : 0-10 Pain Score: 3  Pain Type: Chronic pain Pain Location: Other (Comment) (joint pain)        How often do you need to have someone help you when you read instructions, pamphlets, or other written materials from your doctor or pharmacy?: 5 - Always  Diabetic?YES Nutrition Risk Assessment:  Has the patient had any N/V/D within the last 2 months?  Yes  patient has had Diarrhea in  the last 2 months. Does the patient have any non-healing wounds?  No  Has the patient had any unintentional weight loss or weight gain?  No   Diabetes:  Is the patient diabetic?  Yes  If diabetic, was a CBG obtained today?  No  Did the patient bring in their glucometer from home?  No  How often do you monitor your CBG's? sometimes.   Financial Strains and Diabetes Management:  Are you having any financial strains with the device, your supplies or your medication? No .  Does the patient want to be seen by Chronic Care Management for management of their diabetes?  No  Would the patient like to be referred to a Nutritionist or for Diabetic Management?  No   Diabetic Exams:  Diabetic Eye Exam: Overdue for diabetic eye exam. Pt has been advised about the importance in completing this exam. Patient advised to call and schedule an eye exam. Diabetic Foot Exam: Completed 05/16/2020    Interpreter Needed?: No  Information  entered by :: Saddie Sandeen J,CMA   Activities of Daily Living In your present state of health, do you have any difficulty performing the following activities: 04/19/2021 04/25/2020  Hearing? N N  Vision? Y N  Difficulty concentrating or making decisions? N N  Walking or climbing stairs? N N  Dressing or bathing? N N  Doing errands, shopping? N N  Preparing Food and eating ? N -  Using the Toilet? N -  In the past six months, have you accidently leaked urine? N -  Do you have problems with  loss of bowel control? N -  Managing your Medications? N -  Managing your Finances? N -  Housekeeping or managing your Housekeeping? N -  Some recent data might be hidden    Patient Care Team: Lindell Spar, MD as PCP - General (Internal Medicine)  Indicate any recent Medical Services you may have received from other than [facility] providers in the past year (date may be approximate).     Assessment:   This is a routine wellness examination for Darrell Terry.  Hearing/Vision screen No results found.  Dietary issues and exercise activities discussed: Current Exercise Habits: The patient does not participate in regular exercise at present   Goals Addressed   None    Depression Screen PHQ 2/9 Scores 04/16/2021 12/14/2020 08/16/2020 05/16/2020 04/25/2020  PHQ - 2 Score 1 2 2  0 1  PHQ- 9 Score - 7 4 - 9    Fall Risk Fall Risk  04/16/2021 12/14/2020 08/16/2020 05/16/2020 04/25/2020  Falls in the past year? 0 0 0 0 0  Number falls in past yr: 0 0 0 0 0  Injury with Fall? 0 0 0 0 0  Risk for fall due to : No Fall Risks No Fall Risks No Fall Risks No Fall Risks No Fall Risks  Follow up Falls evaluation completed Falls evaluation completed Falls evaluation completed Falls evaluation completed Falls evaluation completed    FALL RISK PREVENTION PERTAINING TO THE HOME:  Any stairs in or around the home? Yes  If so, are there any without handrails? No  Home free of loose throw rugs in walkways, pet beds,  electrical cords, etc? Yes  Adequate lighting in your home to reduce risk of falls? Yes   ASSISTIVE DEVICES UTILIZED TO PREVENT FALLS:  Life alert? No  Use of a cane, walker or w/c? No  Grab bars in the bathroom? No  Shower chair or bench in shower? No  Elevated toilet seat or a handicapped toilet? No   TIMED UP AND GO:  Was the test performed?  N/A .  Length of time to ambulate 10 feet: N/A sec.    Cognitive Function:     6CIT Screen 04/19/2021  What Year? 0 points  What month? 0 points  What time? 0 points  Count back from 20 0 points  Months in reverse 0 points  Repeat phrase 10 points  Total Score 10    Immunizations Immunization History  Administered Date(s) Administered   Hepatitis A 08/13/2010   Hepatitis B, ped/adol 08/13/2010   Influenza,inj,Quad PF,6+ Mos 04/25/2020, 04/16/2021   Influenza,inj,Quad PF,6-35 Mos 06/03/2019   Influenza,inj,quad, With Preservative 07/12/2014   Influenza-Unspecified 06/27/2018   PFIZER(Purple Top)SARS-COV-2 Vaccination 10/20/2019, 11/12/2019   PPD Test 08/07/2010   Unspecified SARS-COV-2 Vaccination 07/18/2020    TDAP status: Due, Education has been provided regarding the importance of this vaccine. Advised may receive this vaccine at local pharmacy or Health Dept. Aware to provide a copy of the vaccination record if obtained from local pharmacy or Health Dept. Verbalized acceptance and understanding.  Flu Vaccine status: Up to date    Covid-19 vaccine status: Information provided on how to obtain vaccines.   Qualifies for Shingles Vaccine? Yes   Zostavax completed No   Shingrix Completed?: No.    Education has been provided regarding the importance of this vaccine. Patient has been advised to call insurance company to determine out of pocket expense if they have not yet received this vaccine. Advised may also receive vaccine at local pharmacy  or Health Dept. Verbalized acceptance and understanding.  Screening Tests Health  Maintenance  Topic Date Due   OPHTHALMOLOGY EXAM  Never done   TETANUS/TDAP  Never done   Zoster Vaccines- Shingrix (1 of 2) Never done   COVID-19 Vaccine (4 - Booster) 10/10/2020   FOOT EXAM  05/16/2021   HEMOGLOBIN A1C  10/14/2021   COLONOSCOPY (Pts 45-85yrs Insurance coverage will need to be confirmed)  03/25/2025   INFLUENZA VACCINE  Completed   Hepatitis C Screening  Completed   HIV Screening  Completed   HPV VACCINES  Aged Out    Health Maintenance  Health Maintenance Due  Topic Date Due   OPHTHALMOLOGY EXAM  Never done   TETANUS/TDAP  Never done   Zoster Vaccines- Shingrix (1 of 2) Never done   COVID-19 Vaccine (4 - Booster) 10/10/2020    Colorectal cancer screening: Type of screening: Colonoscopy. Completed 03/26/2015. Repeat every 10 years  Lung Cancer Screening: (Low Dose CT Chest recommended if Age 20-80 years, 30 pack-year currently smoking OR have quit w/in 15years.) does qualify.   Lung Cancer Screening Referral: no  Additional Screening:  Hepatitis C Screening: does qualify; Completed 04/10/2021  Vision Screening: Recommended annual ophthalmology exams for early detection of glaucoma and other disorders of the eye. Is the patient up to date with their annual eye exam?  Yes  Who is the provider or what is the name of the office in which the patient attends annual eye exams? [facility] in [place] If pt is not established with a provider, would they like to be referred to a provider to establish care? No .   Dental Screening: Recommended annual dental exams for proper oral hygiene  Community Resource Referral / Chronic Care Management: CRR required this visit?  No   CCM required this visit?  No      Plan:     I have personally reviewed and noted the following in the patient's chart:   Medical and social history Use of alcohol, tobacco or illicit drugs  Current medications and supplements including opioid prescriptions. Patient is not currently  taking opioid prescriptions. Functional ability and status Nutritional status Physical activity Advanced directives List of other physicians Hospitalizations, surgeries, and ER visits in previous 12 months Vitals Screenings to include cognitive, depression, and falls Referrals and appointments  In addition, I have reviewed and discussed with patient certain preventive protocols, quality metrics, and best practice recommendations. A written personalized care plan for preventive services as well as general preventive health recommendations were provided to patient.     Edgar Frisk, [place]   04/19/2021   Nurse Notes: Non Face to FACE 30 minute visit    Darrell Terry , Thank you for taking time to come for your Medicare Wellness Visit. I appreciate your ongoing commitment to your health goals. Please review the following plan we discussed and let me know if I can assist you in the future.   These are the goals we discussed:  Goals   None     This is a list of the screening recommended for you and due dates:  Health Maintenance  Topic Date Due   Eye exam for diabetics  Never done   Tetanus Vaccine  Never done   Zoster (Shingles) Vaccine (1 of 2) Never done   COVID-19 Vaccine (4 - Booster) 10/10/2020   Complete foot exam   05/16/2021   Hemoglobin A1C  10/14/2021   Colon Cancer Screening  03/25/2025  Flu Shot  Completed   Hepatitis C Screening: USPSTF Recommendation to screen - Ages 50-79 yo.  Completed   HIV Screening  Completed   HPV Vaccine  Aged Out

## 2021-05-20 ENCOUNTER — Other Ambulatory Visit: Payer: Self-pay | Admitting: Internal Medicine

## 2021-05-20 DIAGNOSIS — K219 Gastro-esophageal reflux disease without esophagitis: Secondary | ICD-10-CM

## 2021-06-17 ENCOUNTER — Other Ambulatory Visit: Payer: Self-pay

## 2021-06-17 ENCOUNTER — Ambulatory Visit: Payer: Medicare Other

## 2021-06-17 ENCOUNTER — Encounter: Payer: Self-pay | Admitting: Internal Medicine

## 2021-06-17 ENCOUNTER — Ambulatory Visit (INDEPENDENT_AMBULATORY_CARE_PROVIDER_SITE_OTHER): Payer: Medicare Other | Admitting: Internal Medicine

## 2021-06-17 ENCOUNTER — Encounter: Payer: Self-pay | Admitting: *Deleted

## 2021-06-17 DIAGNOSIS — J069 Acute upper respiratory infection, unspecified: Secondary | ICD-10-CM

## 2021-06-17 LAB — POCT INFLUENZA A/B
Influenza A, POC: NEGATIVE
Influenza B, POC: NEGATIVE

## 2021-06-17 MED ORDER — AZITHROMYCIN 250 MG PO TABS: ORAL_TABLET | ORAL | 0 refills | Status: AC

## 2021-06-17 MED ORDER — BENZONATATE 200 MG PO CAPS
200.0000 mg | ORAL_CAPSULE | Freq: Two times a day (BID) | ORAL | 0 refills | Status: DC | PRN
Start: 2021-06-17 — End: 2022-04-08

## 2021-06-17 NOTE — Progress Notes (Signed)
Virtual Visit via Telephone Note   This visit type was conducted due to national recommendations for restrictions regarding the COVID-19 Pandemic (e.g. social distancing) in an effort to limit this patient's exposure and mitigate transmission in our community.  Due to his co-morbid illnesses, this patient is at least at moderate risk for complications without adequate follow up.  This format is felt to be most appropriate for this patient at this time.  The patient did not have access to video technology/had technical difficulties with video requiring transitioning to audio format only (telephone).  All issues noted in this document were discussed and addressed.  No physical exam could be performed with this format.  Evaluation Performed:  Follow-up visit  Date:  06/17/2021   ID:  Darrell Terry, DOB 29-Mar-1966, MRN [id number]  Patient Location: Home Provider Location: Office/Clinic  Participants: Patient and his girlfriend - Calla Kicks Location of Patient: Home Location of Provider: Telehealth Consent was obtain for visit to be over via telehealth. I verified that I am speaking with the correct person using two identifiers.  PCP:  Lindell Spar, MD   Chief Complaint: Fever and cough  History of Present Illness:    Darrell Terry is a 55 y.o. male who has a televisit for c/o fever, cough, nasal congestion, sore throat and myalgias for the last 2 days.  He had an episode of vomiting due to severe coughing.  He denies any dyspnea, wheezing or diarrhea currently.  He had negative COVID test at home.  His girlfriend's grandson had similar symptoms over the weekend who visited him.  The patient does not have symptoms concerning for COVID-19 infection (fever, chills, cough, or new shortness of breath).   Past Medical, Surgical, Social History, Allergies, and Medications have been Reviewed.  Past Medical History:  Diagnosis Date   Depression    Diabetes mellitus without  complication ([place])    Rheumatoid arthritis ([place])    Past Surgical History:  Procedure Laterality Date   CERVICAL SPINE SURGERY     X 3   COLONOSCOPY N/A 03/26/2015   Procedure: COLONOSCOPY;  Surgeon: Daneil Dolin, MD;  [facility];  Service: Endoscopy;  Laterality: N/A;  8:00 AM   TONSILLECTOMY       Current Meds  Medication Sig   atorvastatin (LIPITOR) 40 MG tablet TAKE 1 TABLET BY MOUTH ONCE DAILY.   Cholecalciferol 25 MCG (1000 UT) capsule Take by mouth.   HUMIRA PEN 40 MG/0.4ML PNKT SMARTSIG:40 Milligram(s) SUB-Q Every 2 Weeks   hydroxychloroquine (PLAQUENIL) 200 MG tablet Take 200 mg by mouth 2 (two) times daily.   lisinopril-hydrochlorothiazide (ZESTORETIC) 10-12.5 MG tablet TAKE (1) TABLET BY MOUTH ONCE DAILY.   metFORMIN (GLUCOPHAGE) 500 MG tablet Take 1 tablet (500 mg total) by mouth 2 (two) times daily.   Omega-3 Fatty Acids (FISH OIL) 1000 MG CAPS Take 5,000 mg by mouth daily.    omeprazole (PRILOSEC) 40 MG capsule TAKE (1) CAPSULE BY MOUTH ONCE DAILY.     Allergies:   Patient has no known allergies.   ROS:   Please see the history of present illness.     All other systems reviewed and are negative.   Labs/Other Tests and Data Reviewed:    Recent Labs: 12/10/2020: TSH 2.010 04/10/2021: ALT 36; BUN 13; Creatinine, Ser 0.92; Hemoglobin 15.8; Platelets 180; Potassium 4.2; Sodium 139   Recent Lipid Panel Lab Results  Component Value Date/Time   CHOL 140 12/10/2020 09:55 AM  TRIG 150 (H) 12/10/2020 09:55 AM   HDL 44 12/10/2020 09:55 AM   CHOLHDL 3.2 12/10/2020 09:55 AM   CHOLHDL 5.0 (H) 04/25/2020 09:51 AM   LDLCALC 70 12/10/2020 09:55 AM   LDLCALC 140 (H) 04/25/2020 09:51 AM    Wt Readings from Last 3 Encounters:  04/16/21 216 lb (98 kg)  12/14/20 213 lb 6.4 oz (96.8 kg)  08/16/20 216 lb (98 kg)     ASSESSMENT & PLAN:    URTI Check rapid flu test Tylenol as needed for fever or myalgias Mucinex or Robitussin as needed for cough Nasal  saline spray as needed for nasal congestion If persistent symptoms despite symptomatic treatment, will start  antibiotic  Time:   Today, I have spent 9 minutes reviewing the chart, including problem list, medications, and with the patient with telehealth technology discussing the above problems.   Medication Adjustments/Labs and Tests Ordered: Current medicines are reviewed at length with the patient today.  Concerns regarding medicines are outlined above.   Tests Ordered: No orders of the defined types were placed in this encounter.   Medication Changes: No orders of the defined types were placed in this encounter.    Note: This dictation was prepared with Dragon dictation along with smaller phrase technology. Similar sounding words can be transcribed inadequately or may not be corrected upon review. Any transcriptional errors that result from this process are unintentional.      Disposition:  Follow up  Signed, Lindell Spar, MD  06/17/2021 3:51 PM     [facility]

## 2021-06-17 NOTE — Addendum Note (Signed)
Addended by: Zacarias Pontes R on: 06/17/2021 04:39 PM   Modules accepted: Orders

## 2021-06-17 NOTE — Addendum Note (Signed)
Addended byIhor Dow on: 06/17/2021 04:43 PM   Modules accepted: Orders

## 2021-07-18 ENCOUNTER — Other Ambulatory Visit: Payer: Self-pay | Admitting: Internal Medicine

## 2021-07-18 DIAGNOSIS — I10 Essential (primary) hypertension: Secondary | ICD-10-CM

## 2021-08-06 ENCOUNTER — Other Ambulatory Visit: Payer: Self-pay

## 2021-08-06 ENCOUNTER — Ambulatory Visit (INDEPENDENT_AMBULATORY_CARE_PROVIDER_SITE_OTHER): Payer: Medicare Other | Admitting: Internal Medicine

## 2021-08-06 ENCOUNTER — Encounter: Payer: Self-pay | Admitting: Internal Medicine

## 2021-08-06 VITALS — BP 138/84 | HR 84 | Resp 18 | Ht 72.0 in | Wt 213.1 lb

## 2021-08-06 DIAGNOSIS — J439 Emphysema, unspecified: Secondary | ICD-10-CM

## 2021-08-06 DIAGNOSIS — E782 Mixed hyperlipidemia: Secondary | ICD-10-CM | POA: Diagnosis not present

## 2021-08-06 DIAGNOSIS — F17210 Nicotine dependence, cigarettes, uncomplicated: Secondary | ICD-10-CM | POA: Diagnosis not present

## 2021-08-06 DIAGNOSIS — Z720 Tobacco use: Secondary | ICD-10-CM | POA: Diagnosis not present

## 2021-08-06 DIAGNOSIS — M0579 Rheumatoid arthritis with rheumatoid factor of multiple sites without organ or systems involvement: Secondary | ICD-10-CM

## 2021-08-06 DIAGNOSIS — I10 Essential (primary) hypertension: Secondary | ICD-10-CM

## 2021-08-06 DIAGNOSIS — E1169 Type 2 diabetes mellitus with other specified complication: Secondary | ICD-10-CM

## 2021-08-06 LAB — POCT GLYCOSYLATED HEMOGLOBIN (HGB A1C): HbA1c, POC (controlled diabetic range): 6.6 % (ref 0.0–7.0)

## 2021-08-06 MED ORDER — ALBUTEROL SULFATE HFA 108 (90 BASE) MCG/ACT IN AERS: 2.0000 | INHALATION_SPRAY | Freq: Four times a day (QID) | RESPIRATORY_TRACT | 5 refills | Status: AC | PRN

## 2021-08-06 NOTE — Assessment & Plan Note (Signed)
BP Readings from Last 1 Encounters:  08/06/21 138/84   Well-controlled with Lisinopril-HCTZ now Counseled for compliance with the medications Advised DASH diet and moderate exercise/walking, at least 150 mins/week

## 2021-08-06 NOTE — Patient Instructions (Signed)
Please use Albuterol inhaler as needed for shortness of breath or wheezing.  Please continue taking other medications as prescribed.  Please continue to follow low salt diet and ambulate as tolerated.  Please consider getting Shingrix vaccine at your local pharmacy.  Please get fasting blood tests done before the next visit.

## 2021-08-06 NOTE — Assessment & Plan Note (Signed)
Smokes about 1 pack/day.  Asked about quitting: confirms that he currently smokes cigarettes Advise to quit smoking: Educated about QUITTING to reduce the risk of cancer, cardio and cerebrovascular disease. Assess willingness: Unwilling to quit at this time, but is working on cutting back. Assist with counseling and pharmacotherapy: Counseled for 5 minutes and literature provided. Did not like Chantix in the past. Arrange for follow up: follow up in 3 months and continue to offer help. 

## 2021-08-06 NOTE — Assessment & Plan Note (Signed)
On statin Check lipid profile 

## 2021-08-06 NOTE — Assessment & Plan Note (Addendum)
Lab Results  Component Value Date   HGBA1C 6.6 08/06/2021   On Metformin 500 mg BID Advised to follow diabetic diet, needs to cut down soda intake If HbA1C elevates >7, will add another agent On statin and ACEi Diabetic eye exam: Advised to follow up with Ophthalmology for diabetic eye exam

## 2021-08-06 NOTE — Progress Notes (Signed)
Established Patient Office Visit  Subjective:  Patient ID: Darrell Terry, male    DOB: 11-25-1965  Age: 56 y.o. MRN: [id number]  CC:  Chief Complaint  Patient presents with   Follow-up    4 month follow up     HPI Darrell Terry is a 56 y.o. male with past medical history of HTN, DM, RA and GERD presents for follow up of his blood pressure who presents for f/u of his chronic medical conditions.  BP is well-controlled. Takes medications regularly. Patient denies headache, dizziness, chest pain, or palpitations.  COPD: He continues to smoke a pack per day.  He has been having dyspnea, worse with exertion and with cold exposure.  Denies any wheezing or chronic cough currently.  Denies any fever, chills, nausea or recent weight loss.  His HbA1C was 6.6 in the office today. Takes medications regularly. Denies any polyuria or polydipsia.  RA: He is on Plaquenil only now. His joint pain and swelling has been the same without Humira.     Past Medical History:  Diagnosis Date   Depression    Diabetes mellitus without complication (Darrell Terry)    Rheumatoid arthritis (Darrell Terry)     Past Surgical History:  Procedure Laterality Date   CERVICAL SPINE SURGERY     X 3   COLONOSCOPY N/A 03/26/2015   Procedure: COLONOSCOPY;  Surgeon: Daneil Dolin, MD;  Location: [facility];  Service: Endoscopy;  Laterality: N/A;  8:00 AM   TONSILLECTOMY      Family History  Problem Relation Age of Onset   Stroke Other        undocumented which relative    High Cholesterol Other        undocumented which relative    Heart disease Other        undocumented which relative    Cancer - Other Other        undocumented which relative     Social History   Socioeconomic History   Marital status: Divorced    Spouse name: Not on file   Number of children: Not on file   Years of education: Not on file   Highest education level: Not on file  Occupational History   Not on file  Tobacco Use   Smoking  status: Some Days    Packs/day: 1.00    Years: 34.00    Pack years: 34.00    Types: Cigarettes   Smokeless tobacco: Never  Vaping Use   Vaping Use: Never used  Substance and Sexual Activity   Alcohol use: No    Alcohol/week: 0.0 standard drinks   Drug use: Yes    Types: Marijuana    Comment: has not used in couple months    Sexual activity: Not on file  Other Topics Concern   Not on file  Social History Narrative   ** Merged History Encounter **       Social Determinants of Health   Financial Resource Strain: Medium Risk   Difficulty of Paying Living Expenses: Somewhat hard  Food Insecurity: No Food Insecurity   Worried About Charity fundraiser in the Last Year: Never true   Ran Out of Food in the Last Year: Never true  Transportation Needs: No Transportation Needs   Lack of Transportation (Medical): No   Lack of Transportation (Non-Medical): No  Physical Activity: Inactive   Days of Exercise per Week: 0 days   Minutes of Exercise per Session: 0 min  Stress:  Stress Concern Present   Feeling of Stress : To some extent  Social Connections: Socially Isolated   Frequency of Communication with Friends and Family: Twice a week   Frequency of Social Gatherings with Friends and Family: Never   Attends Religious Services: Never   Printmaker: No   Attends Music therapist: Never   Marital Status: Divorced  Human resources officer Violence: Not At Risk   Fear of Current or Ex-Partner: No   Emotionally Abused: No   Physically Abused: No   Sexually Abused: No    Outpatient Medications Prior to Visit  Medication Sig Dispense Refill   atorvastatin (LIPITOR) 40 MG tablet TAKE 1 TABLET BY MOUTH ONCE DAILY. 90 tablet 0   benzonatate (TESSALON) 200 MG capsule Take 1 capsule (200 mg total) by mouth 2 (two) times daily as needed for cough. 20 capsule 0   Cholecalciferol 25 MCG (1000 UT) capsule Take by mouth.     hydroxychloroquine (PLAQUENIL) 200  MG tablet Take 200 mg by mouth 2 (two) times daily.     lisinopril-hydrochlorothiazide (ZESTORETIC) 10-12.5 MG tablet TAKE (1) TABLET BY MOUTH ONCE DAILY. 90 tablet 0   metFORMIN (GLUCOPHAGE) 500 MG tablet TAKE 1 TABLET BY MOUTH TWICE A DAY. 180 tablet 0   Omega-3 Fatty Acids (FISH OIL) 1000 MG CAPS Take 5,000 mg by mouth daily.      omeprazole (PRILOSEC) 40 MG capsule TAKE (1) CAPSULE BY MOUTH ONCE DAILY. 30 capsule 5   HUMIRA PEN 40 MG/0.4ML PNKT SMARTSIG:40 Milligram(s) SUB-Q Every 2 Weeks     No facility-administered medications prior to visit.    No Known Allergies  ROS Review of Systems  Constitutional:  Negative for chills and fever.  HENT:  Negative for congestion and sore throat.   Eyes:  Negative for pain and discharge.  Respiratory:  Negative for cough and shortness of breath.   Cardiovascular:  Negative for chest pain, palpitations and leg swelling.  Gastrointestinal:  Negative for constipation, diarrhea, nausea and vomiting.  Endocrine: Negative for polydipsia and polyuria.  Genitourinary:  Negative for dysuria and hematuria.  Musculoskeletal:  Positive for arthralgias, back pain and neck pain. Negative for neck stiffness.  Skin:  Negative for rash.  Neurological:  Negative for dizziness, weakness, numbness and headaches.  Psychiatric/Behavioral:  Negative for agitation and behavioral problems.      Objective:    Physical Exam Vitals reviewed.  Constitutional:      General: He is not in acute distress.    Appearance: He is not diaphoretic.  HENT:     Head: Normocephalic and atraumatic.     Nose: Nose normal.     Mouth/Throat:     Mouth: Mucous membranes are moist.  Eyes:     General: No scleral icterus.    Extraocular Movements: Extraocular movements intact.  Cardiovascular:     Rate and Rhythm: Normal rate and regular rhythm.     Pulses: Normal pulses.     Heart sounds: Normal heart sounds. No murmur heard.   No friction rub. No gallop.  Pulmonary:      Breath sounds: Normal breath sounds. No wheezing or rales.  Musculoskeletal:        General: No swelling. Normal range of motion.     Cervical back: Neck supple. No tenderness.     Right lower leg: No edema.     Left lower leg: No edema.  Skin:    General: Skin is warm.  Findings: No rash.  Neurological:     General: No focal deficit present.     Mental Status: He is alert and oriented to person, place, and time.     Sensory: No sensory deficit.     Motor: No weakness.  Psychiatric:        Mood and Affect: Mood normal.        Behavior: Behavior normal.    BP 138/84 (BP Location: Right Arm, Patient Position: Sitting, Cuff Size: Normal)    Pulse 84    Resp 18    Ht 6' (1.829 m)    Wt 213 lb 1.3 oz (96.7 kg)    SpO2 96%    BMI 28.90 kg/m  Wt Readings from Last 3 Encounters:  08/06/21 213 lb 1.3 oz (96.7 kg)  04/16/21 216 lb (98 kg)  12/14/20 213 lb 6.4 oz (96.8 kg)    Lab Results  Component Value Date   TSH 2.010 12/10/2020   Lab Results  Component Value Date   WBC 8.4 04/10/2021   HGB 15.8 04/10/2021   HCT 48.0 04/10/2021   MCV 86 04/10/2021   PLT 180 04/10/2021   Lab Results  Component Value Date   NA 139 04/10/2021   K 4.2 04/10/2021   CO2 23 04/10/2021   GLUCOSE 160 (H) 04/10/2021   BUN 13 04/10/2021   CREATININE 0.92 04/10/2021   BILITOT 0.3 04/10/2021   ALKPHOS 91 04/10/2021   AST 29 04/10/2021   ALT 36 04/10/2021   PROT 7.5 04/10/2021   ALBUMIN 4.8 04/10/2021   CALCIUM 9.8 04/10/2021   EGFR 98 04/10/2021   Lab Results  Component Value Date   CHOL 140 12/10/2020   Lab Results  Component Value Date   HDL 44 12/10/2020   Lab Results  Component Value Date   LDLCALC 70 12/10/2020   Lab Results  Component Value Date   TRIG 150 (H) 12/10/2020   Lab Results  Component Value Date   CHOLHDL 3.2 12/10/2020   Lab Results  Component Value Date   HGBA1C 6.6 08/06/2021      Assessment & Plan:   Problem List Items Addressed This Visit        Cardiovascular and Mediastinum   Hypertension - Primary    BP Readings from Last 1 Encounters:  08/06/21 138/84  Well-controlled with Lisinopril-HCTZ now Counseled for compliance with the medications Advised DASH diet and moderate exercise/walking, at least 150 mins/week        Respiratory   Pulmonary emphysema ([place])    Noted on CT chest Mild dyspnea with exertion, started Albuterol PRN Trying to cut down smoking      Relevant Medications   albuterol (VENTOLIN HFA) 108 (90 Base) MCG/ACT inhaler     Endocrine   DM (diabetes mellitus) ([place])    Lab Results  Component Value Date   HGBA1C 6.6 08/06/2021  On Metformin 500 mg BID Advised to follow diabetic diet, needs to cut down soda intake If HbA1C elevates >7, will add another agent On statin and ACEi Diabetic eye exam: Advised to follow up with Ophthalmology for diabetic eye exam      Relevant Orders   Hemoglobin A1c   POCT glycosylated hemoglobin (Hb A1C) (Completed)     Musculoskeletal and Integument   Rheumatoid arthritis involving multiple sites with positive rheumatoid factor (HCC)    On Plaquenil Follows up with Rheumatologist Advised eye exam as he takes Plaquenil      Relevant Orders   CMP14+EGFR  CBC with Differential/Platelet     Other   Hyperlipidemia    On statin Check lipid profile      Relevant Orders   Lipid Profile   Tobacco abuse    Smokes about 1 pack/day.  Asked about quitting: confirms that he currently smokes cigarettes Advise to quit smoking: Educated about QUITTING to reduce the risk of cancer, cardio and cerebrovascular disease. Assess willingness: Unwilling to quit at this time, but is working on cutting back. Assist with counseling and pharmacotherapy: Counseled for 5 minutes and literature provided. Did not like Chantix in the past. Arrange for follow up: follow up in 3 months and continue to offer help.       Meds ordered this encounter  Medications   albuterol  (VENTOLIN HFA) 108 (90 Base) MCG/ACT inhaler    Sig: Inhale 2 puffs into the lungs every 6 (six) hours as needed for wheezing or shortness of breath.    Dispense:  8 g    Refill:  5    Follow-up: Return in about 4 months (around 12/04/2021) for DM and HTN.    Lindell Spar, MD

## 2021-08-06 NOTE — Assessment & Plan Note (Signed)
On Plaquenil Follows up with Rheumatologist Advised eye exam as he takes Plaquenil 

## 2021-08-06 NOTE — Assessment & Plan Note (Addendum)
Noted on CT chest Mild dyspnea with exertion, started Albuterol PRN Trying to cut down smoking

## 2021-08-16 ENCOUNTER — Ambulatory Visit: Payer: Medicare Other | Admitting: Internal Medicine

## 2021-08-16 DIAGNOSIS — M153 Secondary multiple arthritis: Secondary | ICD-10-CM | POA: Diagnosis not present

## 2021-08-16 DIAGNOSIS — Z79899 Other long term (current) drug therapy: Secondary | ICD-10-CM | POA: Diagnosis not present

## 2021-08-16 DIAGNOSIS — E785 Hyperlipidemia, unspecified: Secondary | ICD-10-CM | POA: Diagnosis not present

## 2021-08-16 DIAGNOSIS — M0579 Rheumatoid arthritis with rheumatoid factor of multiple sites without organ or systems involvement: Secondary | ICD-10-CM | POA: Diagnosis not present

## 2021-08-16 DIAGNOSIS — R945 Abnormal results of liver function studies: Secondary | ICD-10-CM | POA: Diagnosis not present

## 2021-08-22 ENCOUNTER — Other Ambulatory Visit: Payer: Self-pay

## 2021-08-22 ENCOUNTER — Encounter: Payer: Self-pay | Admitting: Internal Medicine

## 2021-08-22 ENCOUNTER — Ambulatory Visit (INDEPENDENT_AMBULATORY_CARE_PROVIDER_SITE_OTHER): Payer: Medicare Other | Admitting: Internal Medicine

## 2021-08-22 DIAGNOSIS — U071 COVID-19: Secondary | ICD-10-CM

## 2021-08-22 MED ORDER — NIRMATRELVIR/RITONAVIR (PAXLOVID)TABLET: 3.0000 | ORAL_TABLET | Freq: Two times a day (BID) | ORAL | 0 refills | Status: AC

## 2021-08-22 NOTE — Progress Notes (Signed)
Virtual Visit via Telephone Note   This visit type was conducted due to national recommendations for restrictions regarding the COVID-19 Pandemic (e.g. social distancing) in an effort to limit this patient's exposure and mitigate transmission in our community.  Due to his co-morbid illnesses, this patient is at least at moderate risk for complications without adequate follow up.  This format is felt to be most appropriate for this patient at this time.  The patient did not have access to video technology/had technical difficulties with video requiring transitioning to audio format only (telephone).  All issues noted in this document were discussed and addressed.  No physical exam could be performed with this format.  Evaluation Performed:  Follow-up visit  Date:  08/22/2021   ID:  Darrell Terry, DOB [birth year], MRN [id number]  Patient Location: Home Provider Location: Office/Clinic  Participants: Patient Location of Patient: Home Location of Provider: Telehealth Consent was obtain for visit to be over via telehealth. I verified that I am speaking with the correct person using two identifiers.  PCP:  Lindell Spar, MD   Chief Complaint: Cough, chills, myalgias  History of Present Illness:    Darrell Terry is a 56 y.o. male who has a televisit for complaint of cough, nasal congestion, myalgias, headache, fever and chills since yesterday.  He denies any dyspnea or wheezing currently.  He tested positive for COVID at home today.  He is girlfriend also had [place] recently and is on treatment currently.  The patient does have symptoms concerning for COVID-19 infection (fever, chills, cough, or new shortness of breath).   Past Medical, Surgical, Social History, Allergies, and Medications have been Reviewed.  Past Medical History:  Diagnosis Date   Depression    Diabetes mellitus without complication ([place])    Rheumatoid arthritis ([place])    Past Surgical History:  Procedure  Laterality Date   CERVICAL SPINE SURGERY     X 3   COLONOSCOPY N/A 03/26/2015   Procedure: COLONOSCOPY;  Surgeon: Daneil Dolin, MD;  Location: [facility];  Service: Endoscopy;  Laterality: N/A;  8:00 AM   TONSILLECTOMY       Current Meds  Medication Sig   albuterol (VENTOLIN HFA) 108 (90 Base) MCG/ACT inhaler Inhale 2 puffs into the lungs every 6 (six) hours as needed for wheezing or shortness of breath.   atorvastatin (LIPITOR) 40 MG tablet TAKE 1 TABLET BY MOUTH ONCE DAILY.   benzonatate (TESSALON) 200 MG capsule Take 1 capsule (200 mg total) by mouth 2 (two) times daily as needed for cough.   Cholecalciferol 25 MCG (1000 UT) capsule Take by mouth.   hydroxychloroquine (PLAQUENIL) 200 MG tablet Take 200 mg by mouth 2 (two) times daily.   lisinopril-hydrochlorothiazide (ZESTORETIC) 10-12.5 MG tablet TAKE (1) TABLET BY MOUTH ONCE DAILY.   metFORMIN (GLUCOPHAGE) 500 MG tablet TAKE 1 TABLET BY MOUTH TWICE A DAY.   Omega-3 Fatty Acids (FISH OIL) 1000 MG CAPS Take 5,000 mg by mouth daily.    omeprazole (PRILOSEC) 40 MG capsule TAKE (1) CAPSULE BY MOUTH ONCE DAILY.     Allergies:   Patient has no known allergies.   ROS:   Please see the history of present illness.     All other systems reviewed and are negative.   Labs/Other Tests and Data Reviewed:    Recent Labs: 12/10/2020: TSH 2.010 04/10/2021: ALT 36; BUN 13; Creatinine, Ser 0.92; Hemoglobin 15.8; Platelets 180; Potassium 4.2; Sodium 139   Recent Lipid  Panel Lab Results  Component Value Date/Time   CHOL 140 12/10/2020 09:55 AM   TRIG 150 (H) 12/10/2020 09:55 AM   HDL 44 12/10/2020 09:55 AM   CHOLHDL 3.2 12/10/2020 09:55 AM   CHOLHDL 5.0 (H) 04/25/2020 09:51 AM   LDLCALC 70 12/10/2020 09:55 AM   LDLCALC 140 (H) 04/25/2020 09:51 AM    Wt Readings from Last 3 Encounters:  08/06/21 213 lb 1.3 oz (96.7 kg)  04/16/21 216 lb (98 kg)  12/14/20 213 lb 6.4 oz (96.8 kg)     ASSESSMENT & PLAN:    COVID-19  infection Started Paxlovid Advised to take inue Mucinex or Robitussin as needed for cough Advised to maintain adequate hydration   Time:   Today, I have spent 9 minutes reviewing the chart, including problem list, medications, and with the patient with telehealth technology discussing the above problems.   Medication Adjustments/Labs and Tests Ordered: Current medicines are reviewed at length with the patient today.  Concerns regarding medicines are outlined above.   Tests Ordered: No orders of the defined types were placed in this encounter.   Medication Changes: No orders of the defined types were placed in this encounter.    Note: This dictation was prepared with Dragon dictation along with smaller phrase technology. Similar sounding words can be transcribed inadequately or may not be corrected upon review. Any transcriptional errors that result from this process are unintentional.      Disposition:  Follow up  Signed, Lindell Spar, MD  08/22/2021 1:40 PM     [facility]

## 2021-09-04 ENCOUNTER — Other Ambulatory Visit (HOSPITAL_COMMUNITY): Payer: Self-pay

## 2021-09-04 DIAGNOSIS — Z122 Encounter for screening for malignant neoplasm of respiratory organs: Secondary | ICD-10-CM

## 2021-09-04 DIAGNOSIS — Z87891 Personal history of nicotine dependence: Secondary | ICD-10-CM

## 2021-09-04 NOTE — Progress Notes (Signed)
Order placed for LDCT per protocol. LDCT scheduled for 1100 on 10/18/21

## 2021-10-18 ENCOUNTER — Ambulatory Visit (HOSPITAL_COMMUNITY)
Admission: RE | Admit: 2021-10-18 | Discharge: 2021-10-18 | Disposition: A | Discharge status: Home / Self Care | Payer: Medicare Other | Source: Ambulatory Visit | Attending: Physician Assistant | Admitting: Physician Assistant

## 2021-10-18 ENCOUNTER — Other Ambulatory Visit: Payer: Self-pay

## 2021-10-18 DIAGNOSIS — Z87891 Personal history of nicotine dependence: Secondary | ICD-10-CM | POA: Insufficient documentation

## 2021-10-18 DIAGNOSIS — Z122 Encounter for screening for malignant neoplasm of respiratory organs: Secondary | ICD-10-CM | POA: Insufficient documentation

## 2021-10-18 DIAGNOSIS — F17210 Nicotine dependence, cigarettes, uncomplicated: Secondary | ICD-10-CM | POA: Diagnosis not present

## 2021-10-22 IMAGING — DX DG CHEST 1V PORT
2 series · 2 of 2 positions shown · non-contrast
Comparison: 09/21/2017, CT 09/28/2017

CLINICAL DATA: Shortness of breath. Cough. Headache.

EXAM:
PORTABLE CHEST 1 VIEW

[chest ap grid (1 of 2)]
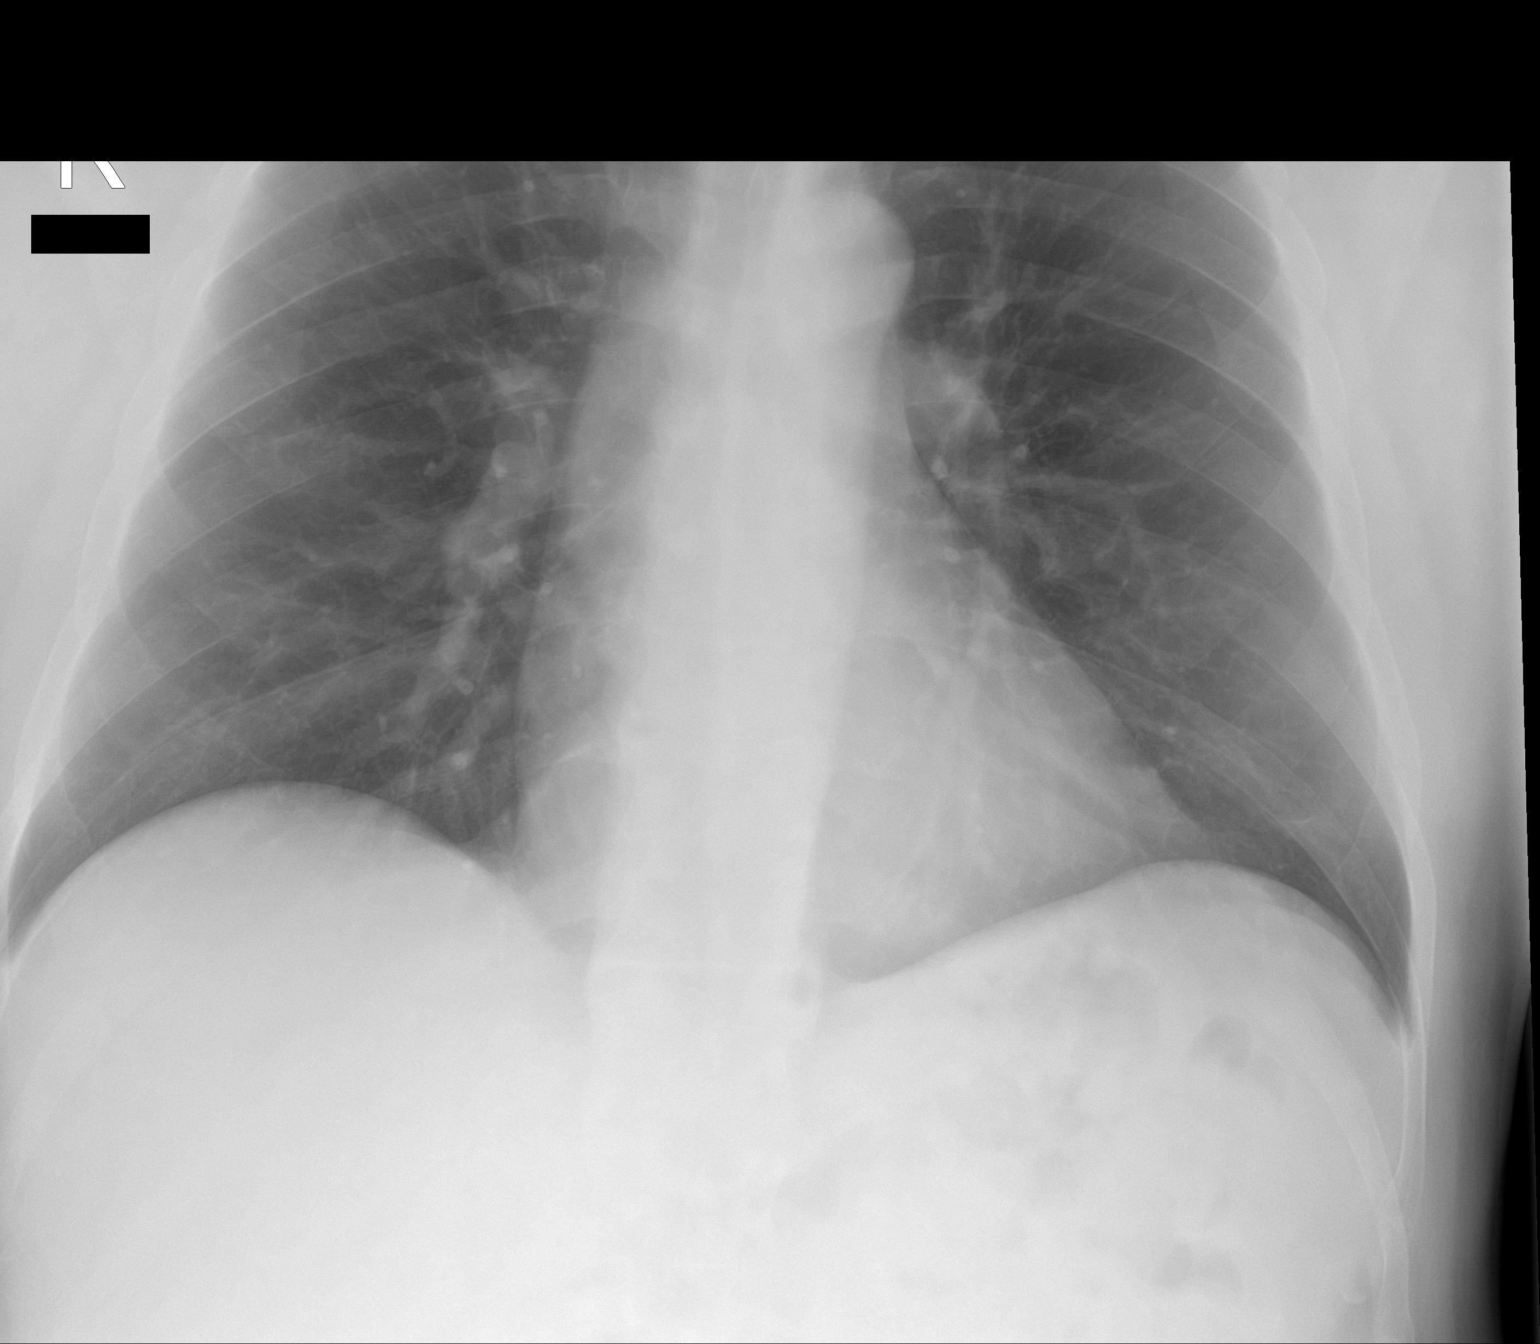

[chest ap grid (2 of 2)]
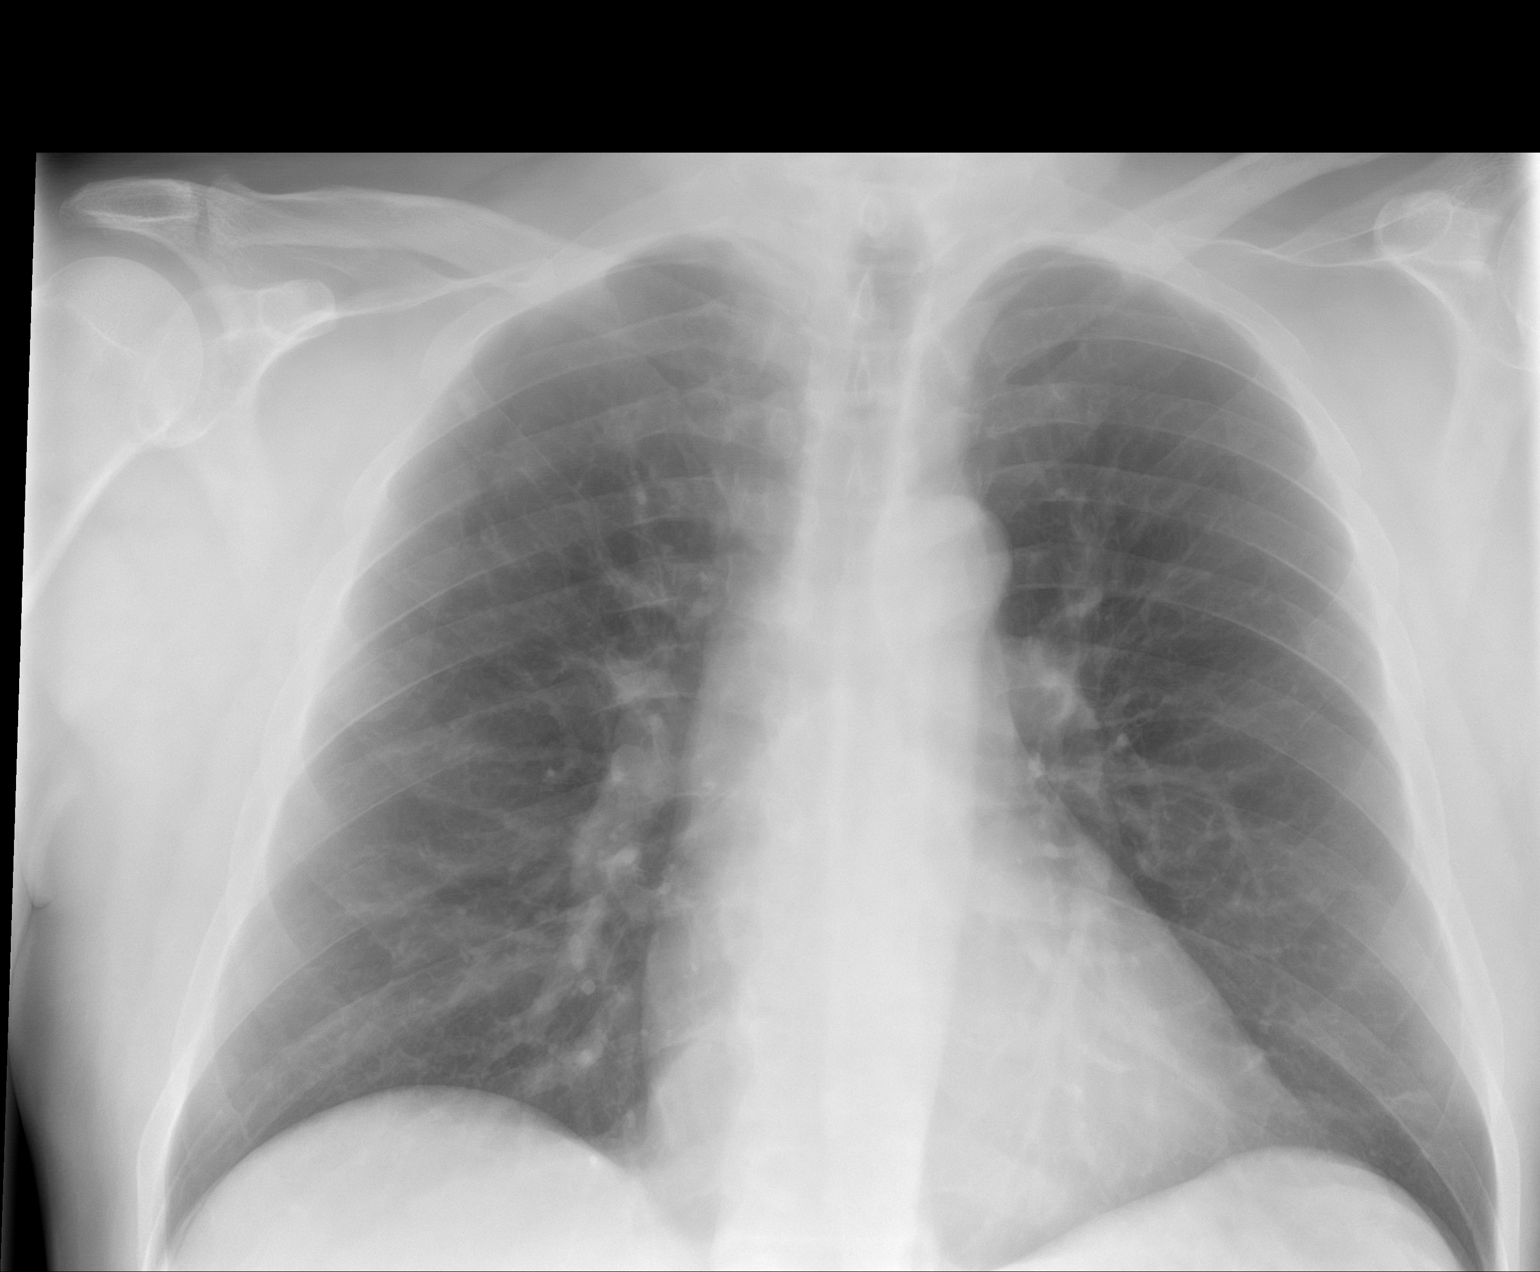

[2 of 2 positions shown; findings below may reference images not displayed]

FINDINGS: The cardiomediastinal contours are normal. The lungs are clear.
Pulmonary vasculature is normal. No consolidation, pleural effusion,
or pneumothorax. No acute osseous abnormalities are seen.
Calcification projecting over the right upper lung represents a bone
island.
IMPRESSION: No acute abnormality.

## 2021-11-29 DIAGNOSIS — E1169 Type 2 diabetes mellitus with other specified complication: Secondary | ICD-10-CM | POA: Diagnosis not present

## 2021-11-29 DIAGNOSIS — M0579 Rheumatoid arthritis with rheumatoid factor of multiple sites without organ or systems involvement: Secondary | ICD-10-CM | POA: Diagnosis not present

## 2021-11-29 DIAGNOSIS — E782 Mixed hyperlipidemia: Secondary | ICD-10-CM | POA: Diagnosis not present

## 2021-11-30 LAB — LIPID PANEL
Chol/HDL Ratio: 2.8 ratio (ref 0.0–5.0)
Cholesterol, Total: 94 mg/dL — ABNORMAL LOW (ref 100–199)
HDL: 34 mg/dL — ABNORMAL LOW (ref >39)
LDL Chol Calc (NIH): 34 mg/dL (ref 0–99)
Triglycerides: 152 mg/dL — ABNORMAL HIGH (ref 0–149)
VLDL Cholesterol Cal: 26 mg/dL (ref 5–40)

## 2021-11-30 LAB — CMP14+EGFR
ALT: 10 IU/L (ref 0–44)
AST: 17 IU/L (ref 0–40)
Albumin/Globulin Ratio: 1.5 (ref 1.2–2.2)
Albumin: 4.3 g/dL (ref 3.8–4.9)
Alkaline Phosphatase: 104 IU/L (ref 44–121)
BUN/Creatinine Ratio: 8 — ABNORMAL LOW (ref 9–20)
BUN: 9 mg/dL (ref 6–24)
Bilirubin Total: 0.3 mg/dL (ref 0.0–1.2)
CO2: 24 mmol/L (ref 20–29)
Calcium: 9.5 mg/dL (ref 8.7–10.2)
Chloride: 100 mmol/L (ref 96–106)
Creatinine, Ser: 1.17 mg/dL (ref 0.76–1.27)
Globulin, Total: 2.8 g/dL (ref 1.5–4.5)
Glucose: 125 mg/dL — ABNORMAL HIGH (ref 70–99)
Potassium: 4.5 mmol/L (ref 3.5–5.2)
Sodium: 139 mmol/L (ref 134–144)
Total Protein: 7.1 g/dL (ref 6.0–8.5)
eGFR: 73 mL/min/{1.73_m2} (ref >59)

## 2021-11-30 LAB — HEMOGLOBIN A1C
Est. average glucose Bld gHb Est-mCnc: 126 mg/dL
Hgb A1c MFr Bld: 6 % — ABNORMAL HIGH (ref 4.8–5.6)

## 2021-11-30 LAB — CBC WITH DIFFERENTIAL/PLATELET
Basophils Absolute: 0.1 10*3/uL (ref 0.0–0.2)
Basos: 1 %
EOS (ABSOLUTE): 0.2 10*3/uL (ref 0.0–0.4)
Eos: 3 %
Hematocrit: 42.6 % (ref 37.5–51.0)
Hemoglobin: 14.1 g/dL (ref 13.0–17.7)
Immature Grans (Abs): 0 10*3/uL (ref 0.0–0.1)
Immature Granulocytes: 0 %
Lymphocytes Absolute: 2.8 10*3/uL (ref 0.7–3.1)
Lymphs: 39 %
MCH: 27.9 pg (ref 26.6–33.0)
MCHC: 33.1 g/dL (ref 31.5–35.7)
MCV: 84 fL (ref 79–97)
Monocytes Absolute: 0.7 10*3/uL (ref 0.1–0.9)
Monocytes: 10 %
Neutrophils Absolute: 3.4 10*3/uL (ref 1.4–7.0)
Neutrophils: 47 %
Platelets: 169 10*3/uL (ref 150–450)
RBC: 5.06 x10E6/uL (ref 4.14–5.80)
RDW: 14.1 % (ref 11.6–15.4)
WBC: 7.2 10*3/uL (ref 3.4–10.8)

## 2021-12-03 ENCOUNTER — Other Ambulatory Visit: Payer: Self-pay | Admitting: *Deleted

## 2021-12-03 DIAGNOSIS — K219 Gastro-esophageal reflux disease without esophagitis: Secondary | ICD-10-CM

## 2021-12-03 MED ORDER — OMEPRAZOLE 40 MG PO CPDR: DELAYED_RELEASE_CAPSULE | ORAL | 2 refills | Status: DC

## 2021-12-04 ENCOUNTER — Encounter: Payer: Self-pay | Admitting: Internal Medicine

## 2021-12-04 ENCOUNTER — Ambulatory Visit (INDEPENDENT_AMBULATORY_CARE_PROVIDER_SITE_OTHER): Payer: Medicare Other | Admitting: Internal Medicine

## 2021-12-04 VITALS — BP 140/80 | HR 86 | Resp 18 | Ht 72.0 in | Wt 196.4 lb

## 2021-12-04 DIAGNOSIS — Z720 Tobacco use: Secondary | ICD-10-CM

## 2021-12-04 DIAGNOSIS — F321 Major depressive disorder, single episode, moderate: Secondary | ICD-10-CM | POA: Diagnosis not present

## 2021-12-04 DIAGNOSIS — E119 Type 2 diabetes mellitus without complications: Secondary | ICD-10-CM

## 2021-12-04 DIAGNOSIS — F331 Major depressive disorder, recurrent, moderate: Secondary | ICD-10-CM | POA: Diagnosis not present

## 2021-12-04 DIAGNOSIS — E1169 Type 2 diabetes mellitus with other specified complication: Secondary | ICD-10-CM

## 2021-12-04 DIAGNOSIS — F17210 Nicotine dependence, cigarettes, uncomplicated: Secondary | ICD-10-CM | POA: Diagnosis not present

## 2021-12-04 DIAGNOSIS — R1030 Lower abdominal pain, unspecified: Secondary | ICD-10-CM | POA: Diagnosis not present

## 2021-12-04 DIAGNOSIS — I10 Essential (primary) hypertension: Secondary | ICD-10-CM

## 2021-12-04 DIAGNOSIS — R110 Nausea: Secondary | ICD-10-CM | POA: Diagnosis not present

## 2021-12-04 DIAGNOSIS — R634 Abnormal weight loss: Secondary | ICD-10-CM | POA: Diagnosis not present

## 2021-12-04 DIAGNOSIS — R112 Nausea with vomiting, unspecified: Secondary | ICD-10-CM | POA: Diagnosis not present

## 2021-12-04 DIAGNOSIS — M0579 Rheumatoid arthritis with rheumatoid factor of multiple sites without organ or systems involvement: Secondary | ICD-10-CM | POA: Diagnosis not present

## 2021-12-04 DIAGNOSIS — E785 Hyperlipidemia, unspecified: Secondary | ICD-10-CM | POA: Diagnosis not present

## 2021-12-04 DIAGNOSIS — J439 Emphysema, unspecified: Secondary | ICD-10-CM

## 2021-12-04 MED ORDER — ONDANSETRON HCL 4 MG PO TABS: 4.0000 mg | ORAL_TABLET | Freq: Three times a day (TID) | ORAL | 0 refills | Status: DC | PRN

## 2021-12-04 MED ORDER — METFORMIN HCL 500 MG PO TABS: 500.0000 mg | ORAL_TABLET | Freq: Every day | ORAL | 1 refills | Status: DC

## 2021-12-04 MED ORDER — ATORVASTATIN CALCIUM 40 MG PO TABS: 40.0000 mg | ORAL_TABLET | Freq: Every day | ORAL | 1 refills | Status: DC

## 2021-12-04 NOTE — Assessment & Plan Note (Signed)
Smokes about 1 pack/day.  Asked about quitting: confirms that he currently smokes cigarettes Advise to quit smoking: Educated about QUITTING to reduce the risk of cancer, cardio and cerebrovascular disease. Assess willingness: Unwilling to quit at this time, but is working on cutting back. Assist with counseling and pharmacotherapy: Counseled for 5 minutes and literature provided. Did not like Chantix in the past. Arrange for follow up: follow up in 3 months and continue to offer help. 

## 2021-12-04 NOTE — Assessment & Plan Note (Signed)
Lab Results  ?Component Value Date  ? HGBA1C 6.0 (H) 11/29/2021  ? ?On Metformin 500 mg BID - decreased to 500 mg QD as he has poor PO intake ?Advised to follow diabetic diet, needs to cut down soda intake ?On statin and ACEi ?Diabetic eye exam: Advised to follow up with Ophthalmology for diabetic eye exam ?

## 2021-12-04 NOTE — Assessment & Plan Note (Addendum)
Has nausea and weight loss, but no other B symptoms ?Zofran for nausea ?Check CT abdomen as he has lower abdominal pain as well ?Referred to GI for persistent nausea - may need EGD ?His mother had gastric ca. ?He has h/o left renal mass, but was deemed to be benign ?

## 2021-12-04 NOTE — Assessment & Plan Note (Signed)
On Plaquenil Follows up with Rheumatologist Advised eye exam as he takes Plaquenil 

## 2021-12-04 NOTE — Assessment & Plan Note (Signed)
Noted on CT chest ?Mild dyspnea with exertion, has Albuterol PRN ?Trying to cut down smoking ?

## 2021-12-04 NOTE — Progress Notes (Signed)
? ?Established Patient Office Visit ? ?Subjective:  ?Patient ID: Darrell Terry, male    DOB: 1966/04/03  Age: 56 y.o. MRN: [id number] ? ?CC:  ?Chief Complaint  ?Patient presents with  ? Follow-up  ?  4 month follow up HTN and DM pt has been getting nauseated when he eats for about 2 months worried about weight loss   ? ? ?HPI ?Darrell Terry is a 56 y.o. male with past medical history of HTN, DM, RA and GERD who presents for f/u of his chronic medical conditions. ? ?HTN: His BP was borderline elevated, but he appears anxious today. Usually, BP is well-controlled. Takes medications regularly. Patient denies headache, dizziness, chest pain, dyspnea or palpitations. ? ?Type 2 DM: His HbA1C was 6.0.  He is on metformin 500 mg twice daily. Denies any polyuria or polydipsia. ? ?Nausea and lower abdominal pain: He complains of persistent nausea on a daily basis, but denies any vomiting currently.  He has not been able to eat regularly due to severe nausea.  He has developed food aversion due to it.  He has lost about 17 lbs since the last visit.  He currently takes omeprazole for gastritis/GERD.  He denies any heartburn, dysphagia or odynophagia.  Denies any melena or hematochezia currently.  Last colonoscopy in 2016 was benign except a rectal polyp. He has h/o left renal mass, which was deemed to be benign - used to follow up with Urology. ? ?MDD: He reports anhedonia, lack of interest in routine activities, insomnia, decreased appetite and weight loss.  He denies any antidepressant for treatment of depression.  He denies any SI or HI currently.  He talks to God for his depressed mood, but denies [facility] therapy referral. ? ?RA: He is on Plaquenil only now. His joint pain and swelling has been the same without Humira. ? ? ?Past Medical History:  ?Diagnosis Date  ? Depression   ? Diabetes mellitus without complication ([place])   ? Rheumatoid arthritis ([place])   ? ? ?Past Surgical History:  ?Procedure Laterality Date  ? CERVICAL  SPINE SURGERY    ? X 3  ? COLONOSCOPY N/A 03/26/2015  ? Procedure: COLONOSCOPY;  Surgeon: Daneil Dolin, MD;  Location: [facility];  Service: Endoscopy;  Laterality: N/A;  8:00 AM  ? TONSILLECTOMY    ? ? ?Family History  ?Problem Relation Age of Onset  ? Stroke Other   ?     undocumented which relative   ? High Cholesterol Other   ?     undocumented which relative   ? Heart disease Other   ?     undocumented which relative   ? Cancer - Other Other   ?     undocumented which relative   ? ? ?Social History  ? ?Socioeconomic History  ? Marital status: Divorced  ?  Spouse name: Not on file  ? Number of children: Not on file  ? Years of education: Not on file  ? Highest education level: Not on file  ?Occupational History  ? Not on file  ?Tobacco Use  ? Smoking status: Some Days  ?  Packs/day: 1.00  ?  Years: 34.00  ?  Pack years: 34.00  ?  Types: Cigarettes  ? Smokeless tobacco: Never  ?Vaping Use  ? Vaping Use: Never used  ?Substance and Sexual Activity  ? Alcohol use: No  ?  Alcohol/week: 0.0 standard drinks  ? Drug use: Yes  ?  Types: Marijuana  ?  Comment: has not used in couple months   ? Sexual activity: Not on file  ?Other Topics Concern  ? Not on file  ?Social History Narrative  ? ** Merged History Encounter **  ?    ? ?Social Determinants of Health  ? ?Financial Resource Strain: Medium Risk  ? Difficulty of Paying Living Expenses: Somewhat hard  ?Food Insecurity: No Food Insecurity  ? Worried About Charity fundraiser in the Last Year: Never true  ? Ran Out of Food in the Last Year: Never true  ?Transportation Needs: No Transportation Needs  ? Lack of Transportation (Medical): No  ? Lack of Transportation (Non-Medical): No  ?Physical Activity: Inactive  ? Days of Exercise per Week: 0 days  ? Minutes of Exercise per Session: 0 min  ?Stress: Stress Concern Present  ? Feeling of Stress : To some extent  ?Social Connections: Socially Isolated  ? Frequency of Communication with Friends and Family: Twice a week  ?  Frequency of Social Gatherings with Friends and Family: Never  ? Attends Religious Services: Never  ? Active Member of Clubs or Organizations: No  ? Attends Archivist Meetings: Never  ? Marital Status: Divorced  ?Intimate Partner Violence: Not At Risk  ? Fear of Current or Ex-Partner: No  ? Emotionally Abused: No  ? Physically Abused: No  ? Sexually Abused: No  ? ? ?Outpatient Medications Prior to Visit  ?Medication Sig Dispense Refill  ? albuterol (VENTOLIN HFA) 108 (90 Base) MCG/ACT inhaler Inhale 2 puffs into the lungs every 6 (six) hours as needed for wheezing or shortness of breath. 8 g 5  ? benzonatate (TESSALON) 200 MG capsule Take 1 capsule (200 mg total) by mouth 2 (two) times daily as needed for cough. 20 capsule 0  ? Cholecalciferol 25 MCG (1000 UT) capsule Take by mouth.    ? hydroxychloroquine (PLAQUENIL) 200 MG tablet Take 200 mg by mouth 2 (two) times daily.    ? lisinopril-hydrochlorothiazide (ZESTORETIC) 10-12.5 MG tablet TAKE (1) TABLET BY MOUTH ONCE DAILY. 90 tablet 0  ? Omega-3 Fatty Acids (FISH OIL) 1000 MG CAPS Take 5,000 mg by mouth daily.     ? omeprazole (PRILOSEC) 40 MG capsule TAKE (1) CAPSULE BY MOUTH ONCE DAILY. 90 capsule 2  ? atorvastatin (LIPITOR) 40 MG tablet TAKE 1 TABLET BY MOUTH ONCE DAILY. 90 tablet 0  ? metFORMIN (GLUCOPHAGE) 500 MG tablet TAKE 1 TABLET BY MOUTH TWICE A DAY. 180 tablet 0  ? ?No facility-administered medications prior to visit.  ? ? ?No Known Allergies ? ?ROS ?Review of Systems  ?Constitutional:  Negative for chills and fever.  ?HENT:  Negative for congestion and sore throat.   ?Eyes:  Negative for pain and discharge.  ?Respiratory:  Negative for cough and shortness of breath.   ?Cardiovascular:  Negative for chest pain, palpitations and leg swelling.  ?Gastrointestinal:  Positive for nausea. Negative for constipation, diarrhea and vomiting.  ?Endocrine: Negative for polydipsia and polyuria.  ?Genitourinary:  Negative for dysuria and hematuria.   ?Musculoskeletal:  Positive for arthralgias, back pain and neck pain. Negative for neck stiffness.  ?Skin:  Negative for rash.  ?Neurological:  Negative for dizziness, weakness, numbness and headaches.  ?Psychiatric/Behavioral:  Positive for decreased concentration, dysphoric mood and sleep disturbance. Negative for agitation, behavioral problems and suicidal ideas. The patient is nervous/anxious.   ? ?  ?Objective:  ?  ?Physical Exam ?Vitals reviewed.  ?Constitutional:   ?   General: He is not in  acute distress. ?   Appearance: He is not diaphoretic.  ?HENT:  ?   Head: Normocephalic and atraumatic.  ?   Nose: Nose normal.  ?   Mouth/Throat:  ?   Mouth: Mucous membranes are moist.  ?Eyes:  ?   General: No scleral icterus. ?   Extraocular Movements: Extraocular movements intact.  ?Cardiovascular:  ?   Rate and Rhythm: Normal rate and regular rhythm.  ?   Pulses: Normal pulses.  ?   Heart sounds: Normal heart sounds. No murmur heard. ?  No friction rub. No gallop.  ?Pulmonary:  ?   Breath sounds: Normal breath sounds. No wheezing or rales.  ?Abdominal:  ?   Palpations: Abdomen is soft.  ?   Tenderness: There is abdominal tenderness (Mild, suprapubic and LLQ).  ?   Hernia: A hernia (Umbilical) is present.  ?Musculoskeletal:     ?   General: No swelling. Normal range of motion.  ?   Cervical back: Neck supple. No tenderness.  ?   Right lower leg: No edema.  ?   Left lower leg: No edema.  ?Skin: ?   General: Skin is warm.  ?   Findings: No rash.  ?Neurological:  ?   General: No focal deficit present.  ?   Mental Status: He is alert and oriented to person, place, and time.  ?   Sensory: No sensory deficit.  ?   Motor: No weakness.  ?Psychiatric:     ?   Mood and Affect: Mood normal.     ?   Behavior: Behavior normal.  ? ? ?BP 140/80 (BP Location: Left Arm, Patient Position: Sitting, Cuff Size: Normal)   Pulse 86   Resp 18   Ht 6' (1.829 m)   Wt 196 lb 6.4 oz (89.1 kg)   SpO2 98%   BMI 26.64 kg/m?  ?Wt Readings from  Last 3 Encounters:  ?12/04/21 196 lb 6.4 oz (89.1 kg)  ?08/06/21 213 lb 1.3 oz (96.7 kg)  ?04/16/21 216 lb (98 kg)  ? ? ?Lab Results  ?Component Value Date  ? TSH 2.010 12/10/2020  ? ?Lab Results  ?Component Val

## 2021-12-04 NOTE — Assessment & Plan Note (Addendum)
On statin Reviewed lipid profile 

## 2021-12-04 NOTE — Assessment & Plan Note (Signed)
Denies antidepressant and BH therapy referral ?Does not have family or friends in the community, but does not want help ?No SI or HI ?

## 2021-12-04 NOTE — Assessment & Plan Note (Signed)
BP Readings from Last 1 Encounters:  ?12/04/21 140/80  ? ?Well-controlled with Lisinopril-HCTZ now ?Counseled for compliance with the medications ?Advised DASH diet and moderate exercise/walking, at least 150 mins/week ?

## 2021-12-04 NOTE — Patient Instructions (Addendum)
Please take Zofran as needed for nausea. ? ?Please take Metformin once daily. ? ?Please avoid skipping meals. Please maintain adequate hydration by taking at least 64 ounces of fluid in a day. ? ?You are being scheduled to get CT abdomen for persistent nausea and lower abdominal pain. ?

## 2021-12-05 ENCOUNTER — Encounter: Payer: Self-pay | Admitting: Internal Medicine

## 2021-12-05 LAB — MICROSCOPIC EXAMINATION
Bacteria, UA: NONE SEEN
Casts: NONE SEEN /lpf
WBC, UA: NONE SEEN /hpf (ref 0–5)

## 2021-12-05 LAB — UA/M W/RFLX CULTURE, ROUTINE
Bilirubin, UA: NEGATIVE
Glucose, UA: NEGATIVE
Leukocytes,UA: NEGATIVE
Nitrite, UA: NEGATIVE
RBC, UA: NEGATIVE
Specific Gravity, UA: 1.028 (ref 1.005–1.030)
Urobilinogen, Ur: 1 mg/dL (ref 0.2–1.0)
pH, UA: 5 (ref 5.0–7.5)

## 2021-12-06 ENCOUNTER — Ambulatory Visit (HOSPITAL_BASED_OUTPATIENT_CLINIC_OR_DEPARTMENT_OTHER): Payer: Medicare Other

## 2021-12-06 LAB — MICROALBUMIN / CREATININE URINE RATIO
Creatinine, Urine: 317.7 mg/dL
Microalb/Creat Ratio: 68 mg/g creat — ABNORMAL HIGH (ref 0–29)
Microalbumin, Urine: 214.5 ug/mL

## 2021-12-07 ENCOUNTER — Ambulatory Visit (HOSPITAL_BASED_OUTPATIENT_CLINIC_OR_DEPARTMENT_OTHER)
Admission: RE | Admit: 2021-12-07 | Discharge: 2021-12-07 | Disposition: A | Discharge status: Home / Self Care | Payer: Medicare Other | Source: Ambulatory Visit | Attending: Internal Medicine | Admitting: Internal Medicine

## 2021-12-07 DIAGNOSIS — I700 Atherosclerosis of aorta: Secondary | ICD-10-CM | POA: Diagnosis not present

## 2021-12-07 DIAGNOSIS — R634 Abnormal weight loss: Secondary | ICD-10-CM | POA: Insufficient documentation

## 2021-12-07 DIAGNOSIS — R1030 Lower abdominal pain, unspecified: Secondary | ICD-10-CM | POA: Insufficient documentation

## 2021-12-07 DIAGNOSIS — R110 Nausea: Secondary | ICD-10-CM | POA: Diagnosis not present

## 2021-12-07 DIAGNOSIS — R109 Unspecified abdominal pain: Secondary | ICD-10-CM | POA: Diagnosis not present

## 2021-12-07 MED ORDER — IOHEXOL 300 MG/ML  SOLN
100.0000 mL | Freq: Once | INTRAMUSCULAR | Status: AC | PRN
  Administered 2021-12-07: 80 mL via INTRAVENOUS

## 2021-12-15 IMAGING — CT CT NECK W/ CM
2 of 4 series · 5 of 14 positions shown, 6 images · IV contrast (iopamidol)
Comparison: None.

CLINICAL DATA: Left-sided neck mass which is painful.

EXAM:
CT NECK WITH CONTRAST
TECHNIQUE: Multidetector CT imaging of the neck was performed using the
standard protocol following the bolus administration of intravenous
contrast.
CONTRAST:  75mL [id number] IOPAMIDOL ([id number]) INJECTION 61%

[Series 3: neck · axial · 0.52mm/px · z∈[-268,-182]mm · 2 of 130 slices shown]
[im 44/130  bone]
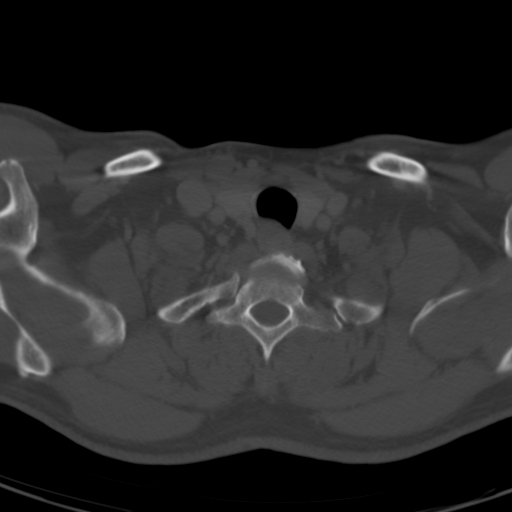
[im 87/130  bone]
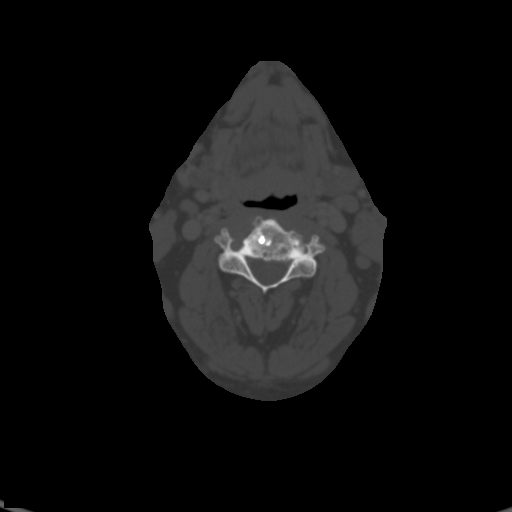

[Series 8: angled axial-oropharynx · axial · 0.51mm/px · z∈[-310,-184]mm · 3 of 129 slices shown, 4 images]
[im 33/129  soft-tissue]
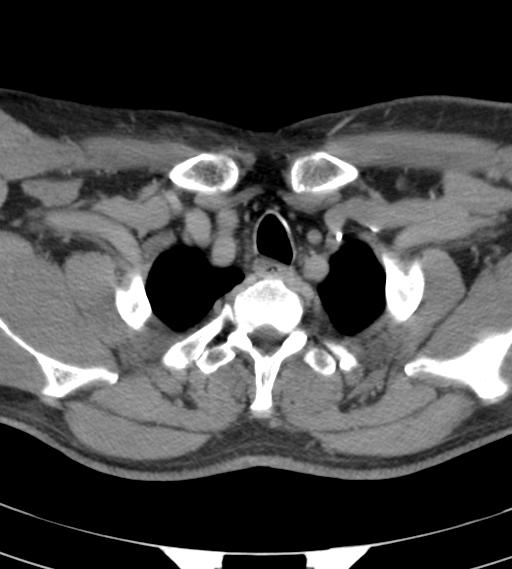
[im 33/129  bone]
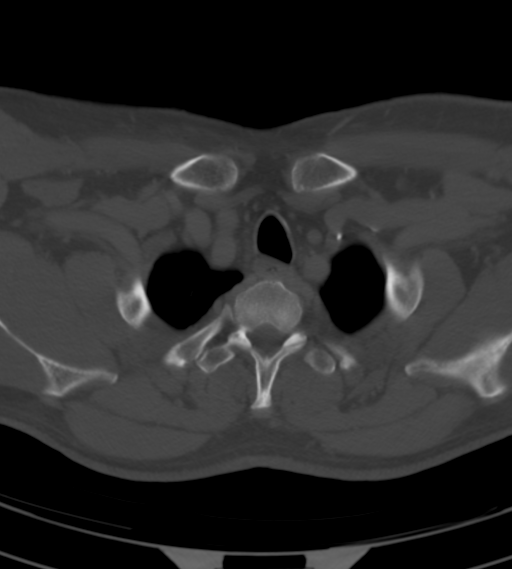
[im 65/129  bone]
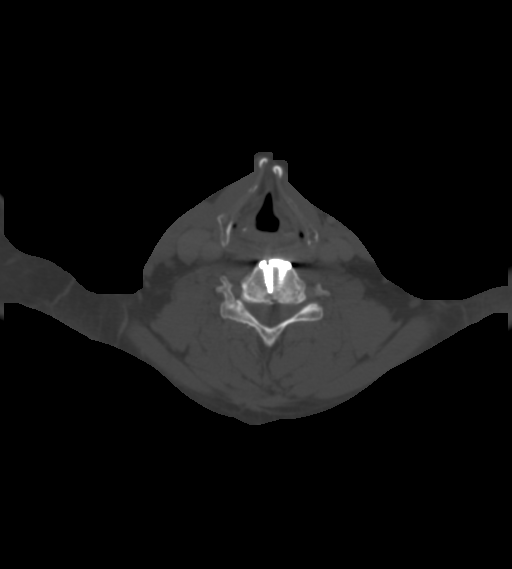
[im 97/129  bone]
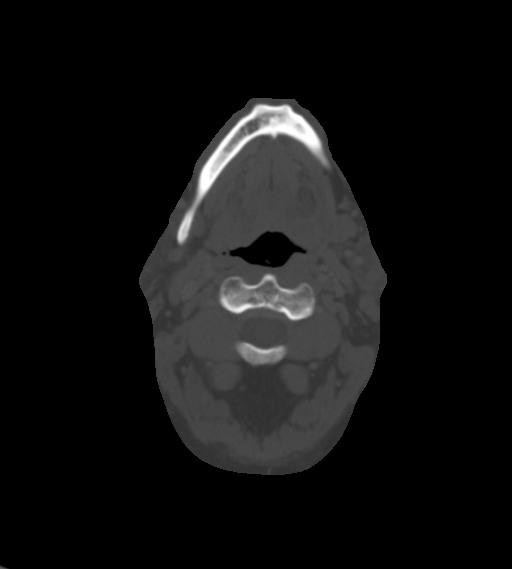

[5 of 14 positions shown; findings below may reference images not displayed]

FINDINGS: Pharynx and larynx: There is prominent tissue at the tongue base.
Whereas this could represent prominent lingual tonsil, direct
inspection suggested rule out actual mass lesion.

Salivary glands: On the right, the parotid and submandibular glands
are normal. On the left, the submandibular gland is normal. Within
the deep superficial junction, there is of low-density lesion
measuring 1.8 x 1.5 x 1.5 cm with a thin enhancing wall. This does
not appear to show surrounding inflammation. This could represent a
cyst, necrotic node or necrotic tumor. Inferior to that, within the
subcutaneous fat, involving the platysma muscle and involving the
superficial aspect of the sternocleidomastoid muscle, there is a
lesion measuring 4.2 cm in length with a transverse dimension of
x 1.5 cm. This shows internal low density primarily. The
differential diagnosis is that of abscess versus is necrotic
tumor/adenopathy.

Thyroid: Normal

Lymph nodes: Otherwise, symmetric and within normal limits lymph
nodes on both sides of the neck.

Vascular: Normal

Limited intracranial: Normal

Visualized orbits: Not include

Mastoids and visualized paranasal sinuses: Mucosal thickening and
fluid in both maxillary sinuses.

Skeleton: Distant ACDF C3 through C6. Degenerative change above and
below that. Radicular cyst within the left anterior mandible.

Upper chest: Negative

Other: None
IMPRESSION: The palpable and painful abnormality represents a 4.2 x 2.2 x 1.5 cm
collection within the subcutaneous fat of the left neck, involving
the left platysma muscle and the superficial portion of the left
sternocleidomastoid muscle. This could represent primary abscess,
necrotic adenopathy or necrotic mass.

Thin walled internal low density lesion within the left parotid
gland at the junction of the superficial and deep lobes measuring
1.8 x 1.5 x 1.5 cm. This could represent an incidental cyst or could
represent a necrotic parotid node or mass.

Abnormal prominent tissue at tongue base. Whereas this could
represent prominent lingual tonsil, possibility of a neoplastic mass
is not excluded and direct inspection would be suggested. This would
not appear to relate the left-sided neck pathology.

## 2021-12-18 ENCOUNTER — Other Ambulatory Visit: Payer: Self-pay | Admitting: Internal Medicine

## 2021-12-18 DIAGNOSIS — I10 Essential (primary) hypertension: Secondary | ICD-10-CM

## 2021-12-24 ENCOUNTER — Telehealth: Payer: Self-pay | Admitting: Internal Medicine

## 2021-12-24 NOTE — Telephone Encounter (Signed)
Sharyn Lull called and is asking if you have the screening from for orders she faxed you today.  Please call her at [phone] lv msg if she can not answer.

## 2021-12-24 NOTE — Telephone Encounter (Signed)
LVM letting her know recieved

## 2021-12-25 NOTE — Telephone Encounter (Signed)
LVM for pt inuquiring if the patient requested this from [facility] screening per provider we need to have pt ok to fax back will not fax back without pt consent

## 2022-01-03 NOTE — Progress Notes (Unsigned)
GI Office Note    Referring Provider: Lindell Spar, MD Primary Care Physician:  Lindell Spar, MD  Primary GI: Dr. Abbey Chatters  Chief Complaint   Chief Complaint  Patient presents with   Abdominal Pain    Lower stomach pains and weight loss, nauseated      History of Present Illness   Darrell Terry is a 56 y.o. male presenting today at the request of Lindell Spar, MD for unintentional weight loss, nausea and abdominal pain.   Previously seen by PCP on 12/04/21 with complaint of daily nausea without vomiting with associated abdominal pain. He had developed food aversion due to nausea and reported a 17 lb weight loss since his last visit in January. Was taking omeprazole for GERD/gastritis. Denied melena or hematochezia. He was given zofran for nausea and CT scan ordered. Family history of gastric cancer (mom), diagnosed in her 10s. Lived for 20 years.   Last colonoscopy 03/26/15 with external hemorrhoids, single diminutive rectal polyp, otherwise normal. Repeat in 10 years.   No history of EGD.  Recent CT A/P with no acute findings in the abdomen/pelvis.   Labs 11/29/2021 -normal CBC, A1c 6, normal LFTs, elevated triglycerides 152, low cholesterol (94)  Today:  GERD - Has been on omeprazole 40 mg. Reports prior to taking it he would have symptoms as night and it would wake him up due to the burning in his throat thinking that he may aspirate. Knows his food triggers. No breakthrough symptoms. Sometimes he will skip a dose. Has not been over eating. Wants to do the omeprazole 40 mg every other day. Does get a sore throat from time to time. Does smoke.   Complains of intermittent sore throat, has runny nose, watery eyes associated with it.   Was having severe abdominal cramps, nausea, no appetite and had dramatic weight loss in about 4 months. Reports he was okay with the weight loss as he thought he was overweight anyway. States he had the CT scan but never got results from  his PCP. Had some denture work done in late November and he was given poor lower dentures. He has been eating softer foods now that are more healthy for him. Not really having any abdominal pain and he was given prn antiemetic  that he has not needed. He has gained a couple of pounds back now. States he feels like if he gets some dental work figured out that he could eat better. He feels like he was not getting enough protein because meats are difficult to eat and he used to eat nuts frequently as well. He can eat chicken very well. Can eat pork and steak if it is cut up frequent enough. Struggles with pizza as well and the harder foods. Not much of a hamburger eater but feels like he could eat it. Has not tried protein supplements. Does not want to gain the weight back. He states he is doing well with his current weight and not concerned now that he has found things that he can eat.   Denies constipation, states he has not had a semiformed stool in at least 25 years, his baseline is a [place] 5-6 but he goes regularly every day.  Denies melena or BRBPR.    Past Medical History:  Diagnosis Date   Depression    Diabetes mellitus without complication ([place])    Rheumatoid arthritis ([place])     Past Surgical History:  Procedure Laterality Date  CERVICAL SPINE SURGERY     X 3   COLONOSCOPY N/A 03/26/2015   Procedure: COLONOSCOPY;  Surgeon: Daneil Dolin, MD;  Location: [facility];  Service: Endoscopy;  Laterality: N/A;  8:00 AM   TONSILLECTOMY      Current Outpatient Medications  Medication Sig Dispense Refill   albuterol (VENTOLIN HFA) 108 (90 Base) MCG/ACT inhaler Inhale 2 puffs into the lungs every 6 (six) hours as needed for wheezing or shortness of breath. 8 g 5   atorvastatin (LIPITOR) 40 MG tablet Take 1 tablet (40 mg total) by mouth daily. 90 tablet 1   Cholecalciferol 25 MCG (1000 UT) capsule Take by mouth.     hydroxychloroquine (PLAQUENIL) 200 MG tablet Take 200 mg by mouth 2 (two)  times daily.     lisinopril-hydrochlorothiazide (ZESTORETIC) 10-12.5 MG tablet TAKE (1) TABLET BY MOUTH ONCE DAILY. 90 tablet 0   metFORMIN (GLUCOPHAGE) 500 MG tablet Take 1 tablet (500 mg total) by mouth daily with breakfast. 90 tablet 1   Omega-3 Fatty Acids (FISH OIL) 1000 MG CAPS Take 5,000 mg by mouth daily.      omeprazole (PRILOSEC) 40 MG capsule TAKE (1) CAPSULE BY MOUTH ONCE DAILY. 90 capsule 2   ondansetron (ZOFRAN) 4 MG tablet Take 1 tablet (4 mg total) by mouth every 8 (eight) hours as needed for nausea or vomiting. 20 tablet 0   benzonatate (TESSALON) 200 MG capsule Take 1 capsule (200 mg total) by mouth 2 (two) times daily as needed for cough. (Patient not taking: Reported on 01/07/2022) 20 capsule 0   No current facility-administered medications for this visit.    Allergies as of 01/07/2022   (No Known Allergies)    Family History  Problem Relation Age of Onset   Stroke Other        undocumented which relative    High Cholesterol Other        undocumented which relative    Heart disease Other        undocumented which relative    Cancer - Other Other        undocumented which relative     Social History   Socioeconomic History   Marital status: Divorced    Spouse name: Not on file   Number of children: Not on file   Years of education: Not on file   Highest education level: Not on file  Occupational History   Not on file  Tobacco Use   Smoking status: Some Days    Packs/day: 1.00    Years: 34.00    Total pack years: 34.00    Types: Cigarettes   Smokeless tobacco: Never  Vaping Use   Vaping Use: Never used  Substance and Sexual Activity   Alcohol use: No    Alcohol/week: 0.0 standard drinks of alcohol   Drug use: Yes    Types: Marijuana    Comment: has not used in couple months    Sexual activity: Not on file  Other Topics Concern   Not on file  Social History Narrative   ** Merged History Encounter **       Social Determinants of Health    Financial Resource Strain: Medium Risk (04/19/2021)   Overall Financial Resource Strain (CARDIA)    Difficulty of Paying Living Expenses: Somewhat hard  Food Insecurity: No Food Insecurity (04/19/2021)   Hunger Vital Sign    Worried About Running Out of Food in the Last Year: Never true    Ran Out  of Food in the Last Year: Never true  Transportation Needs: No Transportation Needs (04/19/2021)   PRAPARE - Hydrologist (Medical): No    Lack of Transportation (Non-Medical): No  Physical Activity: Inactive (04/19/2021)   Exercise Vital Sign    Days of Exercise per Week: 0 days    Minutes of Exercise per Session: 0 min  Stress: Stress Concern Present (04/19/2021)   [place]    Feeling of Stress : To some extent  Social Connections: Socially Isolated (04/19/2021)   Social Connection and Isolation Panel [NHANES]    Frequency of Communication with Friends and Family: Twice a week    Frequency of Social Gatherings with Friends and Family: Never    Attends Religious Services: Never    Marine scientist or Organizations: No    Attends Archivist Meetings: Never    Marital Status: Divorced  Human resources officer Violence: Not At Risk (04/19/2021)   Humiliation, Afraid, Rape, and Kick questionnaire    Fear of Current or Ex-Partner: No    Emotionally Abused: No    Physically Abused: No    Sexually Abused: No     Review of Systems   Gen: Denies any fever, chills, fatigue, weight loss, lack of appetite.  CV: Denies chest pain, heart palpitations, peripheral edema, syncope.  Resp: Denies shortness of breath at rest or with exertion. Denies wheezing or cough.  GI: see HPI GU : Denies urinary burning, urinary frequency, urinary hesitancy MS: Denies joint pain, muscle weakness, cramps, or limitation of movement.  Derm: Denies rash, itching, dry skin Psych: Denies depression, anxiety, memory  loss, and confusion Heme: Denies bruising, bleeding, and enlarged lymph nodes.   Physical Exam   BP 131/80   Pulse 80   Temp 97.7 F (36.5 C) (Temporal)   Ht 6' (1.829 m)   Wt 199 lb 9.6 oz (90.5 kg)   BMI 27.07 kg/m   General:   Alert and oriented. Pleasant and cooperative. Well-nourished and well-developed.  Head:  Normocephalic and atraumatic. Eyes:  Without icterus, sclera clear and conjunctiva pink.  Ears:  Normal auditory acuity. Mouth:  No deformity or lesions, oral mucosa pink.  Lungs:  Clear to auscultation bilaterally. No wheezes, rales, or rhonchi. No distress.  Heart:  S1, S2 present without murmurs appreciated.  Abdomen:  +BS, soft, non-tender and non-distended. No HSM noted. No guarding or rebound. No masses appreciated.  Rectal:  Deferred  Msk:  Symmetrical without gross deformities. Normal posture. Extremities:  Without edema. Neurologic:  Alert and  oriented x4;  grossly normal neurologically. Skin:  Intact without significant lesions or rashes. Psych:  Alert and cooperative. Normal mood and affect.   Assessment   Darrell Terry is a 56 y.o. male with a history of Depression, diabetes, and rheumatoid arthritis presenting today with nausea, lower abdominal pain, and weight loss   Unintentional weight loss: Patient had weight loss of 17 pounds (196 lbs) from January to 5/10 at his PCP office.  He has gained 3 pounds since 5/10, current weight 199 pounds.  Patient states that he felt like he was overweight prior to losing weight and is happy with where his weight is right now.  He is not looking to gain all of that weight back.  He states it was concerning to him that he lost that much weight in that short amount of time, but is actually feeling like a lot of  it was due to diet change given that he is went to softer foods and healthier foods including fruits and vegetables due to problems with his dentures.  He is able to eat some meats, usually does well with  chicken and can eat steak and other meats as long as they are cut up very small.  He states he had some dental work done in November and has a very poor fitting lower denture that he is unable to wear therefore he is unable to eat many foods that he used to eat.  His most recent labs are reassuring, he has no abnormal electrolytes or evidence of anemia or malabsorption. Discussed that given his family history of gastric cancer and his unintentional weight loss that his labs are reassuring however he could benefit from an upper endoscopy and potentially repeat colonoscopy in the near future to rule out malignancy.  He previously had associated abdominal pain and nausea with this as well.  Advised to monitor weight and symptoms for the next few months and we will have him follow-up in the office to check his progress.  He was advised to contact the office if his weight dropped any further.  Could also pursue CTA to rule out chronic mesenteric ischemia as well prior to doing endoscopy.  Abdominal pain/nausea without vomiting: He is unable to recall the exact time when the nausea, abdominal pain, food aversion began but did not last.  He states as though it felt like it was related to his dental issues as stated above along with his dietary changes.  He states that he was given Zofran to take as needed which she has not had to take any and his abdominal pain and nausea has significantly improved.  I reviewed his CT with contrast results with him today.  There was no CT evidence to explain his abdominal pain, weight loss, or nausea.  He states that over the last few weeks he has had very rare nausea.  Denies significant constipation, actually stating that his stools are typically daily and looser in nature and he has not had a semiformed bowel movement in 20+ years.  Advised him to continue to monitor his symptoms and if they return or worsen and are again associated with weight loss that we should pursue endoscopic  evaluation as stated above.  Last colonoscopy in 2016, due for surveillance in 2026.   PLAN   Omeprazole 40 mg every other day, will call with a progress report. Continue to monitor for worsening of symptoms Continue to monitor weight Continue soft diet as tolerated, being mindful of adequate protein intake. Contact the office if symptoms return or worsen in the interim as it is likely that he would need further evaluation with CTA or endoscopies. Follow up in 3 months   Venetia Night, MSN, FNP-BC, AGACNP-BC [facility]

## 2022-01-07 ENCOUNTER — Encounter: Payer: Self-pay | Admitting: Gastroenterology

## 2022-01-07 ENCOUNTER — Ambulatory Visit (INDEPENDENT_AMBULATORY_CARE_PROVIDER_SITE_OTHER): Payer: Medicare Other | Admitting: Gastroenterology

## 2022-01-07 VITALS — BP 131/80 | HR 80 | Temp 97.7°F | Ht 72.0 in | Wt 199.6 lb

## 2022-01-07 DIAGNOSIS — R1084 Generalized abdominal pain: Secondary | ICD-10-CM

## 2022-01-07 DIAGNOSIS — R634 Abnormal weight loss: Secondary | ICD-10-CM

## 2022-01-07 DIAGNOSIS — R110 Nausea: Secondary | ICD-10-CM | POA: Diagnosis not present

## 2022-01-07 NOTE — Patient Instructions (Signed)
As we discussed lets continue taking her omeprazole 40 mg every other day and continue to monitor for breakthrough symptoms.  I am glad that your weight has been stable over the last month and the abdominal pain and nausea has significantly improved.  As we also discussed if for any reason that your symptoms worsen or weight continues to drop again we should get you in for appointment and get you scheduled for an upper endoscopy and colonoscopy to further evaluate your weight loss and your nausea.  This is important given your family history of stomach cancer.  Continue your soft diet and work on obtaining appropriate dental care so you can get back to eating your normal foods.  Always keep in the back of your mind at any time you can increase your protein intake this will help you maintain your weight.  We will have you follow-up in about 3 months, please feel free to contact the office in the interim if your symptoms worsen or any new issues arise.  It was a pleasure to see you today. I want to create trusting relationships with patients. If you receive a survey regarding your visit,  I greatly appreciate you taking time to fill this out on paper or through your MyChart. I value your feedback.  Venetia Night, MSN, FNP-BC, AGACNP-BC [facility]

## 2022-01-17 ENCOUNTER — Telehealth: Payer: Self-pay | Admitting: Internal Medicine

## 2022-01-17 NOTE — Telephone Encounter (Signed)
Darrell Terry said she has faxed the paper 4 times not for Dr Eliane Decree signature.  I have the faxed document and putting it on Dr Eliane Decree desk now with a note to give back to me to fax.

## 2022-01-20 ENCOUNTER — Telehealth: Payer: Self-pay | Admitting: Internal Medicine

## 2022-01-27 DIAGNOSIS — E785 Hyperlipidemia, unspecified: Secondary | ICD-10-CM | POA: Diagnosis not present

## 2022-01-27 DIAGNOSIS — K760 Fatty (change of) liver, not elsewhere classified: Secondary | ICD-10-CM | POA: Diagnosis not present

## 2022-01-27 DIAGNOSIS — R221 Localized swelling, mass and lump, neck: Secondary | ICD-10-CM | POA: Diagnosis not present

## 2022-01-27 DIAGNOSIS — M069 Rheumatoid arthritis, unspecified: Secondary | ICD-10-CM | POA: Diagnosis not present

## 2022-01-27 DIAGNOSIS — M153 Secondary multiple arthritis: Secondary | ICD-10-CM | POA: Diagnosis not present

## 2022-01-27 DIAGNOSIS — Z79899 Other long term (current) drug therapy: Secondary | ICD-10-CM | POA: Diagnosis not present

## 2022-01-27 DIAGNOSIS — R634 Abnormal weight loss: Secondary | ICD-10-CM | POA: Diagnosis not present

## 2022-01-27 DIAGNOSIS — R7989 Other specified abnormal findings of blood chemistry: Secondary | ICD-10-CM | POA: Diagnosis not present

## 2022-01-27 DIAGNOSIS — M0579 Rheumatoid arthritis with rheumatoid factor of multiple sites without organ or systems involvement: Secondary | ICD-10-CM | POA: Diagnosis not present

## 2022-01-27 DIAGNOSIS — R2233 Localized swelling, mass and lump, upper limb, bilateral: Secondary | ICD-10-CM | POA: Diagnosis not present

## 2022-04-08 ENCOUNTER — Ambulatory Visit (INDEPENDENT_AMBULATORY_CARE_PROVIDER_SITE_OTHER): Payer: Medicare Other | Admitting: Internal Medicine

## 2022-04-08 ENCOUNTER — Encounter: Payer: Self-pay | Admitting: Internal Medicine

## 2022-04-08 VITALS — BP 136/82 | HR 81 | Resp 18 | Ht 72.0 in | Wt 194.6 lb

## 2022-04-08 DIAGNOSIS — M0579 Rheumatoid arthritis with rheumatoid factor of multiple sites without organ or systems involvement: Secondary | ICD-10-CM | POA: Diagnosis not present

## 2022-04-08 DIAGNOSIS — I10 Essential (primary) hypertension: Secondary | ICD-10-CM

## 2022-04-08 DIAGNOSIS — Z23 Encounter for immunization: Secondary | ICD-10-CM

## 2022-04-08 DIAGNOSIS — K219 Gastro-esophageal reflux disease without esophagitis: Secondary | ICD-10-CM | POA: Diagnosis not present

## 2022-04-08 DIAGNOSIS — Z720 Tobacco use: Secondary | ICD-10-CM

## 2022-04-08 DIAGNOSIS — E1169 Type 2 diabetes mellitus with other specified complication: Secondary | ICD-10-CM

## 2022-04-08 LAB — POCT GLYCOSYLATED HEMOGLOBIN (HGB A1C)
HbA1c POC (<> result, manual entry): 6.1 % (ref 4.0–5.6)
HbA1c, POC (controlled diabetic range): 6.1 % (ref 0.0–7.0)
HbA1c, POC (prediabetic range): 6.1 % (ref 5.7–6.4)

## 2022-04-08 NOTE — Assessment & Plan Note (Addendum)
Lab Results  Component Value Date   HGBA1C 6.1 04/08/2022   HGBA1C 6.1 04/08/2022   HGBA1C 6.1 04/08/2022   Overall well-controlled Associated with HTN, HLD and GERD On Metformin 500 mg QD - had decreased to 500 mg QD as he has poor PO intake Advised to follow diabetic diet, needs to cut down soda intake On statin and ACEi Diabetic eye exam: Advised to follow up with Ophthalmology for diabetic eye exam

## 2022-04-08 NOTE — Assessment & Plan Note (Signed)
On Plaquenil Follows up with Rheumatologist Advised eye exam as he takes Plaquenil

## 2022-04-08 NOTE — Assessment & Plan Note (Signed)
On Omeprazole Nausea improved now

## 2022-04-08 NOTE — Patient Instructions (Addendum)
Please continue to take medications as prescribed.  Please continue to follow low carb diet and ambulate as tolerated.  Please try to cut down -> quit smoking.  Please consider getting Shingrix and Tdap vaccines at local pharmacy.

## 2022-04-08 NOTE — Progress Notes (Signed)
Established Patient Office Visit  Subjective:  Patient ID: Darrell Terry, male    DOB: 1965-11-26  Age: 56 y.o. MRN: [id number]  CC:  Chief Complaint  Patient presents with   Follow-up    4 month follow up DM and nausea nausea is better than it was     HPI Darrell Terry is a 56 y.o. male with past medical history of HTN, DM, RA and GERD who presents for f/u of his chronic medical conditions.  GERD and nausea: His nausea has improved now. He takes Omeprazole QOD.  His weight has been stable since the last visit.  He reports that his weight loss could be due to dental work-up, due to which he was not able to eat properly.  He is able to chew food better and tolerates regular diet now.  HTN: BP is well-controlled. Takes medications regularly. Patient denies headache, dizziness, chest pain, dyspnea or palpitations.  Type 2 DM: His HbA1C was 6.1.  He is on metformin 500 mg once daily now. Denies any polyuria or polydipsia.  RA: He is on Plaquenil only now. His joint pain and swelling has been the same without Humira. He has not had eye exam recently.   Past Medical History:  Diagnosis Date   Depression    Diabetes mellitus without complication ([place])    Rheumatoid arthritis ([place])     Past Surgical History:  Procedure Laterality Date   CERVICAL SPINE SURGERY     X 3   COLONOSCOPY N/A 03/26/2015   Procedure: COLONOSCOPY;  Surgeon: Daneil Dolin, MD;  [facility];  Service: Endoscopy;  Laterality: N/A;  8:00 AM   TONSILLECTOMY      Family History  Problem Relation Age of Onset   Stroke Other        undocumented which relative    High Cholesterol Other        undocumented which relative    Heart disease Other        undocumented which relative    Cancer - Other Other        undocumented which relative     Social History   Socioeconomic History   Marital status: Divorced    Spouse name: Not on file   Number of children: Not on file   Years of  education: Not on file   Highest education level: Not on file  Occupational History   Not on file  Tobacco Use   Smoking status: Some Days    Packs/day: 1.00    Years: 34.00    Total pack years: 34.00    Types: Cigarettes   Smokeless tobacco: Never  Vaping Use   Vaping Use: Never used  Substance and Sexual Activity   Alcohol use: No    Alcohol/week: 0.0 standard drinks of alcohol   Drug use: Yes    Types: Marijuana    Comment: has not used in couple months    Sexual activity: Not on file  Other Topics Concern   Not on file  Social History Narrative   ** Merged History Encounter **       Social Determinants of Health   Financial Resource Strain: Medium Risk (04/19/2021)   Overall Financial Resource Strain (CARDIA)    Difficulty of Paying Living Expenses: Somewhat hard  Food Insecurity: No Food Insecurity (04/19/2021)   Hunger Vital Sign    Worried About Running Out of Food in the Last Year: Never true    Ran Out  of Food in the Last Year: Never true  Transportation Needs: No Transportation Needs (04/19/2021)   PRAPARE - Hydrologist (Medical): No    Lack of Transportation (Non-Medical): No  Physical Activity: Inactive (04/19/2021)   Exercise Vital Sign    Days of Exercise per Week: 0 days    Minutes of Exercise per Session: 0 min  Stress: Stress Concern Present (04/19/2021)   [place]    Feeling of Stress : To some extent  Social Connections: Socially Isolated (04/19/2021)   Social Connection and Isolation Panel [NHANES]    Frequency of Communication with Friends and Family: Twice a week    Frequency of Social Gatherings with Friends and Family: Never    Attends Religious Services: Never    Marine scientist or Organizations: No    Attends Archivist Meetings: Never    Marital Status: Divorced  Human resources officer Violence: Not At Risk (04/19/2021)   Humiliation,  Afraid, Rape, and Kick questionnaire    Fear of Current or Ex-Partner: No    Emotionally Abused: No    Physically Abused: No    Sexually Abused: No    Outpatient Medications Prior to Visit  Medication Sig Dispense Refill   albuterol (VENTOLIN HFA) 108 (90 Base) MCG/ACT inhaler Inhale 2 puffs into the lungs every 6 (six) hours as needed for wheezing or shortness of breath. 8 g 5   atorvastatin (LIPITOR) 40 MG tablet Take 1 tablet (40 mg total) by mouth daily. 90 tablet 1   Cholecalciferol 25 MCG (1000 UT) capsule Take by mouth.     hydroxychloroquine (PLAQUENIL) 200 MG tablet Take 200 mg by mouth 2 (two) times daily.     lisinopril-hydrochlorothiazide (ZESTORETIC) 10-12.5 MG tablet TAKE (1) TABLET BY MOUTH ONCE DAILY. 90 tablet 0   metFORMIN (GLUCOPHAGE) 500 MG tablet Take 1 tablet (500 mg total) by mouth daily with breakfast. 90 tablet 1   Omega-3 Fatty Acids (FISH OIL) 1000 MG CAPS Take 5,000 mg by mouth daily.      omeprazole (PRILOSEC) 40 MG capsule TAKE (1) CAPSULE BY MOUTH ONCE DAILY. 90 capsule 2   benzonatate (TESSALON) 200 MG capsule Take 1 capsule (200 mg total) by mouth 2 (two) times daily as needed for cough. (Patient not taking: Reported on 01/07/2022) 20 capsule 0   ondansetron (ZOFRAN) 4 MG tablet Take 1 tablet (4 mg total) by mouth every 8 (eight) hours as needed for nausea or vomiting. (Patient not taking: Reported on 04/08/2022) 20 tablet 0   No facility-administered medications prior to visit.    No Known Allergies  ROS Review of Systems  Constitutional:  Negative for chills and fever.  HENT:  Negative for congestion and sore throat.   Eyes:  Negative for pain and discharge.  Respiratory:  Negative for cough and shortness of breath.   Cardiovascular:  Negative for chest pain, palpitations and leg swelling.  Gastrointestinal:  Negative for constipation, diarrhea and vomiting.  Endocrine: Negative for polydipsia and polyuria.  Genitourinary:  Negative for dysuria and  hematuria.  Musculoskeletal:  Positive for arthralgias, back pain and neck pain. Negative for neck stiffness.  Skin:  Negative for rash.  Neurological:  Negative for dizziness, weakness, numbness and headaches.  Psychiatric/Behavioral:  Positive for dysphoric mood and sleep disturbance. Negative for agitation, behavioral problems and suicidal ideas. The patient is nervous/anxious.       Objective:    Physical Exam Vitals  reviewed.  Constitutional:      General: He is not in acute distress.    Appearance: He is not diaphoretic.  HENT:     Head: Normocephalic and atraumatic.     Nose: Nose normal.     Mouth/Throat:     Mouth: Mucous membranes are moist.  Eyes:     General: No scleral icterus.    Extraocular Movements: Extraocular movements intact.  Cardiovascular:     Rate and Rhythm: Normal rate and regular rhythm.     Pulses: Normal pulses.     Heart sounds: Normal heart sounds. No murmur heard.    No friction rub. No gallop.  Pulmonary:     Breath sounds: Normal breath sounds. No wheezing or rales.  Abdominal:     Palpations: Abdomen is soft.     Tenderness: There is no abdominal tenderness.     Hernia: A hernia (Umbilical) is present.  Musculoskeletal:        General: No swelling. Normal range of motion.     Cervical back: Neck supple. No tenderness.     Right lower leg: No edema.     Left lower leg: No edema.  Skin:    General: Skin is warm.     Findings: No rash.  Neurological:     General: No focal deficit present.     Mental Status: He is alert and oriented to person, place, and time.     Sensory: No sensory deficit.     Motor: No weakness.  Psychiatric:        Mood and Affect: Mood normal.        Behavior: Behavior normal.     BP 136/82 (BP Location: Right Arm, Patient Position: Sitting, Cuff Size: Normal)   Pulse 81   Resp 18   Ht 6' (1.829 m)   Wt 194 lb 9.6 oz (88.3 kg)   SpO2 97%   BMI 26.39 kg/m  Wt Readings from Last 3 Encounters:  04/08/22  194 lb 9.6 oz (88.3 kg)  01/07/22 199 lb 9.6 oz (90.5 kg)  12/04/21 196 lb 6.4 oz (89.1 kg)    Lab Results  Component Value Date   TSH 2.010 12/10/2020   Lab Results  Component Value Date   WBC 7.2 11/29/2021   HGB 14.1 11/29/2021   HCT 42.6 11/29/2021   MCV 84 11/29/2021   PLT 169 11/29/2021   Lab Results  Component Value Date   NA 139 11/29/2021   K 4.5 11/29/2021   CO2 24 11/29/2021   GLUCOSE 125 (H) 11/29/2021   BUN 9 11/29/2021   CREATININE 1.17 11/29/2021   BILITOT 0.3 11/29/2021   ALKPHOS 104 11/29/2021   AST 17 11/29/2021   ALT 10 11/29/2021   PROT 7.1 11/29/2021   ALBUMIN 4.3 11/29/2021   CALCIUM 9.5 11/29/2021   EGFR 73 11/29/2021   Lab Results  Component Value Date   CHOL 94 (L) 11/29/2021   Lab Results  Component Value Date   HDL 34 (L) 11/29/2021   Lab Results  Component Value Date   LDLCALC 34 11/29/2021   Lab Results  Component Value Date   TRIG 152 (H) 11/29/2021   Lab Results  Component Value Date   CHOLHDL 2.8 11/29/2021   Lab Results  Component Value Date   HGBA1C 6.1 04/08/2022   HGBA1C 6.1 04/08/2022   HGBA1C 6.1 04/08/2022      Assessment & Plan:   Problem List Items Addressed This Visit  Cardiovascular and Mediastinum   Hypertension - Primary    BP Readings from Last 1 Encounters:  04/08/22 136/82  Well-controlled with Lisinopril-HCTZ now Counseled for compliance with the medications Advised DASH diet and moderate exercise/walking, at least 150 mins/week        Digestive   GERD (gastroesophageal reflux disease)    On Omeprazole Nausea improved now        Endocrine   DM (diabetes mellitus) ([place])    Lab Results  Component Value Date   HGBA1C 6.1 04/08/2022   HGBA1C 6.1 04/08/2022   HGBA1C 6.1 04/08/2022  Overall well-controlled Associated with HTN, HLD and GERD On Metformin 500 mg QD - had decreased to 500 mg QD as he has poor PO intake Advised to follow diabetic diet, needs to cut down soda  intake On statin and ACEi Diabetic eye exam: Advised to follow up with Ophthalmology for diabetic eye exam      Relevant Orders   POCT glycosylated hemoglobin (Hb A1C) (Completed)   Ambulatory referral to Ophthalmology     Musculoskeletal and Integument   Rheumatoid arthritis involving multiple sites with positive rheumatoid factor ([place])    On Plaquenil Follows up with Rheumatologist Advised eye exam as he takes Plaquenil      Relevant Orders   Ambulatory referral to Ophthalmology     Other   Tobacco abuse    Smokes about 1 pack/day.  Asked about quitting: confirms that he currently smokes cigarettes Advise to quit smoking: Educated about QUITTING to reduce the risk of cancer, cardio and cerebrovascular disease. Assess willingness: Unwilling to quit at this time, but is working on cutting back. Assist with counseling and pharmacotherapy: Counseled for 5 minutes and literature provided. Did not like Chantix in the past. Arrange for follow up: follow up in 3 months and continue to offer help.      Other Visit Diagnoses     Need for immunization against influenza       Relevant Orders   Flu Vaccine QUAD 39moIM (Fluarix, Fluzone & Alfiuria Quad PF) (Completed)       No orders of the defined types were placed in this encounter.   Follow-up: Return in about 6 months (around 10/07/2022) for DM and RA.    RLindell Spar MD

## 2022-04-08 NOTE — Assessment & Plan Note (Signed)
BP Readings from Last 1 Encounters:  04/08/22 136/82   Well-controlled with Lisinopril-HCTZ now Counseled for compliance with the medications Advised DASH diet and moderate exercise/walking, at least 150 mins/week

## 2022-04-08 NOTE — Assessment & Plan Note (Signed)
Smokes about 1 pack/day.  Asked about quitting: confirms that he currently smokes cigarettes Advise to quit smoking: Educated about QUITTING to reduce the risk of cancer, cardio and cerebrovascular disease. Assess willingness: Unwilling to quit at this time, but is working on cutting back. Assist with counseling and pharmacotherapy: Counseled for 5 minutes and literature provided. Did not like Chantix in the past. Arrange for follow up: follow up in 3 months and continue to offer help.

## 2022-04-09 NOTE — Progress Notes (Deleted)
GI Office Note    Referring Provider: Lindell Spar, MD Primary Care Physician:  Lindell Spar, MD Primary Gastroenterologist: ***  Date:  04/09/2022  ID:  Darrell Terry, DOB 09/22/1965, MRN [id number]   Chief Complaint   No chief complaint on file.    History of Present Illness  Darrell Terry is a 56 y.o. male with a history of depression, diabetes, rheumatoid arthritis*** presenting today for follow-up.   Last colonoscopy 03/26/2015 with external hemorrhoids, single diminutive rectal polyp, otherwise normal.  Repeat in 10 years.  CT A/P 12/07/2021 with no acute findings in the abdomen or pelvis.  Last seen in the office 01/11/2022.  He reported prior to initiating omeprazole he was having symptoms at night that wake him up and he was having burning in his throat.  Has been well controlled since taking omeprazole and denied any breakthrough symptoms.  He is also having intermittent sore throat, runny nose, watery eyes.  He reported he was having severe abdominal cramps, nausea, lack of appetite and had a dramatic weight loss over the prior 4 months but reported that he was okay with weight loss needed to lose weight anyway.  He had recent CT A/P with no acute findings in the abdomen/pelvis.  He did report that in late November 2022 he was having issues with his dentures and therefore he was eating softer foods and foods that are more healthy for him which she believes may have contributed to his weight loss.  At this time he reported actually gained a couple pounds back and thought that if he got his teeth fixed that he can eat better.  He reported he was not eating much meats given it was difficult to chew without teeth and he had not tried any protein supplements.  He denies any constipation, nausea, or BRBPR.  We discussed performing upper endoscopy in early interval colonoscopy given his weight CTA to rule out chronic mesenteric ischemia patient would rather wait to see if he is  able to gain any weight back.  His abdominal pain and nausea had also recently improved it was not even needed the Zofran prescribed to him prior.  He was advised to continue his PPI and monitor his weight.   Weights: 12/14/20 - 213 pounds (PCP) 04/16/2021 - 216 pounds (PCP) 08/06/2021 - 213 pounds (PCP) 08/16/2021 - 209 pounds (Rheumatology) 12/04/2021 -196 pounds (PCP) 01/07/2022 -199 pounds (RGA) 01/27/2022 - 201 pounds (Rheumatology) 04/08/2022 -194 pounds (PCP)  Patient seen by PCP 04/08/2022 where he reported his nausea was improved and he has been taking omeprazole every other day.  He reported his weight has been stable since his last visit and again felt as though his weight loss was due to his dental issues and not being able to eat properly he reported he has been tolerating a regular diet.  Today:    Current Outpatient Medications  Medication Sig Dispense Refill   albuterol (VENTOLIN HFA) 108 (90 Base) MCG/ACT inhaler Inhale 2 puffs into the lungs every 6 (six) hours as needed for wheezing or shortness of breath. 8 g 5   atorvastatin (LIPITOR) 40 MG tablet Take 1 tablet (40 mg total) by mouth daily. 90 tablet 1   Cholecalciferol 25 MCG (1000 UT) capsule Take by mouth.     hydroxychloroquine (PLAQUENIL) 200 MG tablet Take 200 mg by mouth 2 (two) times daily.     lisinopril-hydrochlorothiazide (ZESTORETIC) 10-12.5 MG tablet TAKE (1) TABLET BY MOUTH ONCE DAILY. 90 tablet  0   metFORMIN (GLUCOPHAGE) 500 MG tablet Take 1 tablet (500 mg total) by mouth daily with breakfast. 90 tablet 1   Omega-3 Fatty Acids (FISH OIL) 1000 MG CAPS Take 5,000 mg by mouth daily.      omeprazole (PRILOSEC) 40 MG capsule TAKE (1) CAPSULE BY MOUTH ONCE DAILY. 90 capsule 2   No current facility-administered medications for this visit.    Past Medical History:  Diagnosis Date   Depression    Diabetes mellitus without complication ([place])    Rheumatoid arthritis ([place])     Past Surgical History:  Procedure  Laterality Date   CERVICAL SPINE SURGERY     X 3   COLONOSCOPY N/A 03/26/2015   Procedure: COLONOSCOPY;  Surgeon: Daneil Dolin, MD;  [facility];  Service: Endoscopy;  Laterality: N/A;  8:00 AM   TONSILLECTOMY      Family History  Problem Relation Age of Onset   Stroke Other        undocumented which relative    High Cholesterol Other        undocumented which relative    Heart disease Other        undocumented which relative    Cancer - Other Other        undocumented which relative     Allergies as of 04/10/2022   (No Known Allergies)    Social History   Socioeconomic History   Marital status: Divorced    Spouse name: Not on file   Number of children: Not on file   Years of education: Not on file   Highest education level: Not on file  Occupational History   Not on file  Tobacco Use   Smoking status: Some Days    Packs/day: 1.00    Years: 34.00    Total pack years: 34.00    Types: Cigarettes   Smokeless tobacco: Never  Vaping Use   Vaping Use: Never used  Substance and Sexual Activity   Alcohol use: No    Alcohol/week: 0.0 standard drinks of alcohol   Drug use: Yes    Types: Marijuana    Comment: has not used in couple months    Sexual activity: Not on file  Other Topics Concern   Not on file  Social History Narrative   ** Merged History Encounter **       Social Determinants of Health   Financial Resource Strain: Medium Risk (04/19/2021)   Overall Financial Resource Strain (CARDIA)    Difficulty of Paying Living Expenses: Somewhat hard  Food Insecurity: No Food Insecurity (04/19/2021)   Hunger Vital Sign    Worried About Running Out of Food in the Last Year: Never true    Ran Out of Food in the Last Year: Never true  Transportation Needs: No Transportation Needs (04/19/2021)   PRAPARE - Hydrologist (Medical): No    Lack of Transportation (Non-Medical): No  Physical Activity: Inactive (04/19/2021)   Exercise  Vital Sign    Days of Exercise per Week: 0 days    Minutes of Exercise per Session: 0 min  Stress: Stress Concern Present (04/19/2021)   [place]    Feeling of Stress : To some extent  Social Connections: Socially Isolated (04/19/2021)   Social Connection and Isolation Panel [NHANES]    Frequency of Communication with Friends and Family: Twice a week    Frequency of Social Gatherings with Friends  and Family: Never    Attends Religious Services: Never    Active Member of Clubs or Organizations: No    Attends Archivist Meetings: Never    Marital Status: Divorced     Review of Systems   Gen: Denies fever, chills, anorexia. Denies fatigue, weakness, weight loss.  CV: Denies chest pain, palpitations, syncope, peripheral edema, and claudication. Resp: Denies dyspnea at rest, cough, wheezing, coughing up blood, and pleurisy. GI: See HPI Derm: Denies rash, itching, dry skin Psych: Denies depression, anxiety, memory loss, confusion. No homicidal or suicidal ideation.  Heme: Denies bruising, bleeding, and enlarged lymph nodes.   Physical Exam   There were no vitals taken for this visit.  Weights: 12/14/20 - 213 pounds (PCP) 04/16/2021 - 216 pounds (PCP) 08/06/2021 - 213 pounds (PCP) 08/16/2021 - 209 pounds (Rheumatology) 12/04/2021 -196 pounds (PCP) 01/07/2022 -199 pounds (RGA) 01/27/2022 - 201 pounds (Rheumatology) 04/08/2022 -194 pounds (PCP)  General:   Alert and oriented. No distress noted. Pleasant and cooperative.  Head:  Normocephalic and atraumatic. Eyes:  Conjuctiva clear without scleral icterus. Mouth:  Oral mucosa pink and moist. Good dentition. No lesions. Lungs:  Clear to auscultation bilaterally. No wheezes, rales, or rhonchi. No distress.  Heart:  S1, S2 present without murmurs appreciated.  Abdomen:  +BS, soft, non-tender and non-distended. No rebound or guarding. No HSM or masses noted. Rectal:  *** Msk:  Symmetrical without gross deformities. Normal posture. Extremities:  Without edema. Neurologic:  Alert and  oriented x4 Psych:  Alert and cooperative. Normal mood and affect.   Assessment  Darrell Terry is a 56 y.o. male with a history of depression, diabetes, rheumatoid arthritis*** presenting today for follow-up.   GERD: Well controlled on omeprazole 40 mg daily.***  Weight loss: Per review of weights it appears the patient has lost an average of about 20 pounds since January 2023.  Patient was previously having dental issues that he was unable to eat very many meats his protein sources, most commonly eating soft foods.   PLAN   ***     Venetia Night, MSN, FNP-BC, AGACNP-BC [facility]

## 2022-04-10 ENCOUNTER — Inpatient Hospital Stay: Payer: Medicare Other | Admitting: Gastroenterology

## 2022-04-22 ENCOUNTER — Telehealth: Payer: Medicare Other | Admitting: Physician Assistant

## 2022-04-22 ENCOUNTER — Ambulatory Visit: Payer: Medicare Other

## 2022-04-22 ENCOUNTER — Encounter: Payer: Self-pay | Admitting: Internal Medicine

## 2022-04-22 DIAGNOSIS — J441 Chronic obstructive pulmonary disease with (acute) exacerbation: Secondary | ICD-10-CM

## 2022-04-22 DIAGNOSIS — J208 Acute bronchitis due to other specified organisms: Secondary | ICD-10-CM

## 2022-04-22 MED ORDER — PREDNISONE 20 MG PO TABS: 40.0000 mg | ORAL_TABLET | Freq: Every day | ORAL | 0 refills | Status: DC

## 2022-04-22 MED ORDER — BENZONATATE 100 MG PO CAPS: 100.0000 mg | ORAL_CAPSULE | Freq: Three times a day (TID) | ORAL | 0 refills | Status: DC | PRN

## 2022-04-22 NOTE — Progress Notes (Unsigned)
Subjective:   Darrell Terry is a 56 y.o. male who presents for Medicare Annual/Subsequent preventive examination.  Review of Systems    I connected with  [name] on 04/22/22 by a audio enabled telemedicine application and verified that I am speaking with the correct person using two identifiers.  Patient Location: Home  Provider Location: Office/Clinic  I discussed the limitations of evaluation and management by telemedicine. The patient expressed understanding and agreed to proceed.        Objective:    There were no vitals filed for this visit. There is no height or weight on file to calculate BMI.     04/19/2021    4:25 PM 07/29/2019    4:25 AM 07/30/2017    1:46 PM 06/22/2017    1:02 PM 04/25/2015    9:34 AM 03/26/2015    7:15 AM  Advanced Directives  Does Patient Have a Medical Advance Directive? No No No No No No  Would patient like information on creating a medical advance directive?  No - Patient declined No - Patient declined No - Patient declined  No - patient declined information    Current Medications (verified) Outpatient Encounter Medications as of 04/22/2022  Medication Sig   albuterol (VENTOLIN HFA) 108 (90 Base) MCG/ACT inhaler Inhale 2 puffs into the lungs every 6 (six) hours as needed for wheezing or shortness of breath.   atorvastatin (LIPITOR) 40 MG tablet Take 1 tablet (40 mg total) by mouth daily.   benzonatate (TESSALON) 100 MG capsule Take 1 capsule (100 mg total) by mouth 3 (three) times daily as needed for cough.   Cholecalciferol 25 MCG (1000 UT) capsule Take by mouth.   hydroxychloroquine (PLAQUENIL) 200 MG tablet Take 200 mg by mouth 2 (two) times daily.   lisinopril-hydrochlorothiazide (ZESTORETIC) 10-12.5 MG tablet TAKE (1) TABLET BY MOUTH ONCE DAILY.   metFORMIN (GLUCOPHAGE) 500 MG tablet Take 1 tablet (500 mg total) by mouth daily with breakfast.   Omega-3 Fatty Acids (FISH OIL) 1000 MG CAPS Take 5,000 mg by mouth daily.     omeprazole (PRILOSEC) 40 MG capsule TAKE (1) CAPSULE BY MOUTH ONCE DAILY.   predniSONE (DELTASONE) 20 MG tablet Take 2 tablets (40 mg total) by mouth daily with breakfast.   No facility-administered encounter medications on file as of 04/22/2022.    Allergies (verified) Patient has no known allergies.   History: Past Medical History:  Diagnosis Date   Depression    Diabetes mellitus without complication (HCC)    Rheumatoid arthritis (HCC)    Past Surgical History:  Procedure Laterality Date   CERVICAL SPINE SURGERY     X 3   COLONOSCOPY N/A 03/26/2015   Procedure: COLONOSCOPY;  Surgeon: Corbin Ade, MD;  Location: [facility];  Service: Endoscopy;  Laterality: N/A;  8:00 AM   TONSILLECTOMY     Family History  Problem Relation Age of Onset   Stroke Other        undocumented which relative    High Cholesterol Other        undocumented which relative    Heart disease Other        undocumented which relative    Cancer - Other Other        undocumented which relative    Social History   Socioeconomic History   Marital status: Divorced    Spouse name: Not on file   Number of children: Not on file   Years of  education: Not on file   Highest education level: Not on file  Occupational History   Not on file  Tobacco Use   Smoking status: Some Days    Packs/day: 1.00    Years: 34.00    Total pack years: 34.00    Types: Cigarettes   Smokeless tobacco: Never  Vaping Use   Vaping Use: Never used  Substance and Sexual Activity   Alcohol use: No    Alcohol/week: 0.0 standard drinks of alcohol   Drug use: Yes    Types: Marijuana    Comment: has not used in couple months    Sexual activity: Not on file  Other Topics Concern   Not on file  Social History Narrative   ** Merged History Encounter **       Social Determinants of Health   Financial Resource Strain: Medium Risk (04/19/2021)   Overall Financial Resource Strain (CARDIA)    Difficulty of Paying Living  Expenses: Somewhat hard  Food Insecurity: No Food Insecurity (04/19/2021)   Hunger Vital Sign    Worried About Running Out of Food in the Last Year: Never true    Ran Out of Food in the Last Year: Never true  Transportation Needs: No Transportation Needs (04/19/2021)   PRAPARE - Administrator, Civil Service (Medical): No    Lack of Transportation (Non-Medical): No  Physical Activity: Inactive (04/19/2021)   Exercise Vital Sign    Days of Exercise per Week: 0 days    Minutes of Exercise per Session: 0 min  Stress: Stress Concern Present (04/19/2021)   [employer] of Occupational Health - Occupational Stress Questionnaire    Feeling of Stress : To some extent  Social Connections: Socially Isolated (04/19/2021)   Social Connection and Isolation Panel [NHANES]    Frequency of Communication with Friends and Family: Twice a week    Frequency of Social Gatherings with Friends and Family: Never    Attends Religious Services: Never    Database administrator or Organizations: No    Attends Engineer, structural: Never    Marital Status: Divorced    Tobacco Counseling Ready to quit: Not Answered Counseling given: Not Answered   Clinical Intake:                 Diabetic?yes Nutrition Risk Assessment:  Has the patient had any N/V/D within the last 2 months?  {YES/NO:21197} Does the patient have any non-healing wounds?  {YES/NO:21197} Has the patient had any unintentional weight loss or weight gain?  {YES/NO:21197}  Diabetes:  Is the patient diabetic?  Yes  If diabetic, was a CBG obtained today?  No  Did the patient bring in their glucometer from home?  No  How often do you monitor your CBG's? ***.   Financial Strains and Diabetes Management:  Are you having any financial strains with the device, your supplies or your medication? No .  Does the patient want to be seen by Chronic Care Management for management of their diabetes?  No  Would the  patient like to be referred to a Nutritionist or for Diabetic Management?  No   Diabetic Exams:  {Diabetic Eye Exam:2101801} {Diabetic Foot Exam:2101802}          Activities of Daily Living     No data to display           Patient Care Team: Anabel Halon, MD as PCP - General (Internal Medicine)  Indicate any recent Medical Services  you may have received from other than [facility] providers in the past year (date may be approximate).     Assessment:   This is a routine wellness examination for Jacksen.  Hearing/Vision screen No results found.  Dietary issues and exercise activities discussed:     Goals Addressed   None   Depression Screen    04/08/2022    8:05 AM 12/04/2021    8:06 AM 08/22/2021    1:27 PM 08/06/2021    8:48 AM 06/17/2021    3:33 PM 04/16/2021    8:23 AM 12/14/2020    8:05 AM  PHQ 2/9 Scores  PHQ - 2 Score 0 2 0 0 0 1 2  PHQ- 9 Score 0 15     7    Fall Risk    04/08/2022    8:05 AM 12/04/2021    8:06 AM 08/22/2021    1:27 PM 08/06/2021    8:48 AM 06/17/2021    3:33 PM  Fall Risk   Falls in the past year? 0 0 0 0 0  Number falls in past yr: 0 0 0 0 0  Injury with Fall? 0 0 0 0 0  Risk for fall due to : No Fall Risks No Fall Risks No Fall Risks No Fall Risks No Fall Risks  Follow up Falls evaluation completed Falls evaluation completed Falls evaluation completed Falls evaluation completed Falls evaluation completed    FALL RISK PREVENTION PERTAINING TO THE HOME:  Any stairs in or around the home? Yes  If so, are there any without handrails? No  Home free of loose throw rugs in walkways, pet beds, electrical cords, etc? Yes  Adequate lighting in your home to reduce risk of falls? Yes   ASSISTIVE DEVICES UTILIZED TO PREVENT FALLS:  Life alert? No  Use of a cane, walker or w/c? No  Grab bars in the bathroom? No  Shower chair or bench in shower? No  Elevated toilet seat or a handicapped toilet? No          04/19/2021    4:26 PM   6CIT Screen  What Year? 0 points  What month? 0 points  What time? 0 points  Count back from 20 0 points  Months in reverse 0 points  Repeat phrase 10 points  Total Score 10 points    Immunizations Immunization History  Administered Date(s) Administered   Hepatitis A 08/13/2010   Hepatitis A, Ped/Adol-2 Dose 08/13/2010   Hepatitis B, PED/ADOLESCENT 08/13/2010   Influenza,inj,Quad PF,6+ Mos 04/25/2020, 04/16/2021, 04/08/2022   Influenza,inj,Quad PF,6-35 Mos 06/03/2019   Influenza,inj,quad, With Preservative 07/12/2014   Influenza-Unspecified 06/27/2018   PFIZER(Purple Top)SARS-COV-2 Vaccination 10/20/2019, 11/12/2019   PPD Test 08/07/2010   Unspecified SARS-COV-2 Vaccination 07/18/2020    TDAP status: Due, Education has been provided regarding the importance of this vaccine. Advised may receive this vaccine at local pharmacy or Health Dept. Aware to provide a copy of the vaccination record if obtained from local pharmacy or Health Dept. Verbalized acceptance and understanding.  Flu Vaccine status: Up to date    Covid-19 vaccine status: Completed vaccines  Qualifies for Shingles Vaccine? Yes   Zostavax completed No   Shingrix Completed?: No.    Education has been provided regarding the importance of this vaccine. Patient has been advised to call insurance company to determine out of pocket expense if they have not yet received this vaccine. Advised may also receive vaccine at local pharmacy or Health Dept. Verbalized acceptance and understanding.  Screening Tests Health Maintenance  Topic Date Due   OPHTHALMOLOGY EXAM  Never done   TETANUS/TDAP  Never done   Zoster Vaccines- Shingrix (1 of 2) Never done   COVID-19 Vaccine (4 - Mixed Product risk series) 09/12/2020   FOOT EXAM  08/06/2022   HEMOGLOBIN A1C  10/07/2022   Diabetic kidney evaluation - GFR measurement  11/30/2022   Diabetic kidney evaluation - Urine ACR  12/05/2022   COLONOSCOPY (Pts 45-28yrs Insurance  coverage will need to be confirmed)  03/25/2025   INFLUENZA VACCINE  Completed   Hepatitis C Screening  Completed   HIV Screening  Completed   HPV VACCINES  Aged Out    Health Maintenance  Health Maintenance Due  Topic Date Due   OPHTHALMOLOGY EXAM  Never done   TETANUS/TDAP  Never done   Zoster Vaccines- Shingrix (1 of 2) Never done   COVID-19 Vaccine (4 - Mixed Product risk series) 09/12/2020    Colorectal cancer screening: Type of screening: Colonoscopy. Completed 03/26/15. Repeat every 10 years  Lung Cancer Screening: (Low Dose CT Chest recommended if Age 59-80 years, 30 pack-year currently smoking OR have quit w/in 15years.) does qualify.   Lung Cancer Screening Referral: completed 10/21/21  Additional Screening:  Hepatitis C Screening: does qualify; Completed 04/10/21  Vision Screening: Recommended annual ophthalmology exams for early detection of glaucoma and other disorders of the eye. Is the patient up to date with their annual eye exam?  {YES/NO:21197} Who is the provider or what is the name of the office in which the patient attends annual eye exams? *** If pt is not established with a provider, would they like to be referred to a provider to establish care? {YES/NO:21197}.   Dental Screening: Recommended annual dental exams for proper oral hygiene  Community Resource Referral / Chronic Care Management: CRR required this visit?  No   CCM required this visit?  No      Plan:     I have personally reviewed and noted the following in the patient's chart:   Medical and social history Use of alcohol, tobacco or illicit drugs  Current medications and supplements including opioid prescriptions. Patient is not currently taking opioid prescriptions. Functional ability and status Nutritional status Physical activity Advanced directives List of other physicians Hospitalizations, surgeries, and ER visits in previous 12 months Vitals Screenings to include  cognitive, depression, and falls Referrals and appointments  In addition, I have reviewed and discussed with patient certain preventive protocols, quality metrics, and best practice recommendations. A written personalized care plan for preventive services as well as general preventive health recommendations were provided to patient.     Jasper Riling, [place]   04/22/2022

## 2022-04-22 NOTE — Progress Notes (Signed)
I have spent 5 minutes in review of e-visit questionnaire, review and updating patient chart, medical decision making and response to patient.   Zayd Bonet Cody Kieara Schwark, PA-C    

## 2022-04-22 NOTE — Progress Notes (Signed)
We are sorry that you are not feeling well.  Here is how we plan to help!  Based on your presentation I believe you most likely have A cough due to a virus.  This is called viral bronchitis and is best treated by rest, plenty of fluids and control of the cough.  You may use Ibuprofen or Tylenol as directed to help your symptoms.  I am concerned that this is causing a mild exacerbation of your COPD, which we do not want to get worse. I have sent in a prescription for a prednisone burst to help open airways and calm down spasm in your bronchi. I have also sent in a prescription cough medication to take as directed. Keep up with your inhalers.     From your responses in the eVisit questionnaire you describe inflammation in the upper respiratory tract which is causing a significant cough.  This is commonly called Bronchitis and has four common causes:   Allergies Viral Infections Acid Reflux Bacterial Infection Allergies, viruses and acid reflux are treated by controlling symptoms or eliminating the cause. An example might be a cough caused by taking certain blood pressure medications. You stop the cough by changing the medication. Another example might be a cough caused by acid reflux. Controlling the reflux helps control the cough.  USE OF BRONCHODILATOR ("RESCUE") INHALERS: There is a risk from using your bronchodilator too frequently.  The risk is that over-reliance on a medication which only relaxes the muscles surrounding the breathing tubes can reduce the effectiveness of medications prescribed to reduce swelling and congestion of the tubes themselves.  Although you feel brief relief from the bronchodilator inhaler, your asthma may actually be worsening with the tubes becoming more swollen and filled with mucus.  This can delay other crucial treatments, such as oral steroid medications. If you need to use a bronchodilator inhaler daily, several times per day, you should discuss this with your  provider.  There are probably better treatments that could be used to keep your asthma under control.     HOME CARE Only take medications as instructed by your medical team. Complete the entire course of an antibiotic. Drink plenty of fluids and get plenty of rest. Avoid close contacts especially the very young and the elderly Cover your mouth if you cough or cough into your sleeve. Always remember to wash your hands A steam or ultrasonic humidifier can help congestion.   GET HELP RIGHT AWAY IF: You develop worsening fever. You become short of breath You cough up blood. Your symptoms persist after you have completed your treatment plan MAKE SURE YOU  Understand these instructions. Will watch your condition. Will get help right away if you are not doing well or get worse.    Thank you for choosing an e-visit.  Your e-visit answers were reviewed by a board certified advanced clinical practitioner to complete your personal care plan. Depending upon the condition, your plan could have included both over the counter or prescription medications.  Please review your pharmacy choice. Make sure the pharmacy is open so you can pick up prescription now. If there is a problem, you may contact your provider through [employer] and have the prescription routed to another pharmacy.  Your safety is important to [place]. If you have drug allergies check your prescription carefully.   For the next 24 hours you can use MyChart to ask questions about today's visit, request a non-urgent call back, or ask for a work or school  You will get an email in the next two days asking about your experience. I hope that your e-visit has been valuable and will speed your recovery.  

## 2022-04-30 ENCOUNTER — Other Ambulatory Visit: Payer: Self-pay | Admitting: Internal Medicine

## 2022-04-30 DIAGNOSIS — I10 Essential (primary) hypertension: Secondary | ICD-10-CM

## 2022-04-30 DIAGNOSIS — K219 Gastro-esophageal reflux disease without esophagitis: Secondary | ICD-10-CM

## 2022-05-12 ENCOUNTER — Encounter: Payer: Self-pay | Admitting: Internal Medicine

## 2022-05-13 ENCOUNTER — Encounter: Payer: Self-pay | Admitting: Internal Medicine

## 2022-06-04 DIAGNOSIS — H25813 Combined forms of age-related cataract, bilateral: Secondary | ICD-10-CM | POA: Diagnosis not present

## 2022-06-04 DIAGNOSIS — H18513 Endothelial corneal dystrophy, bilateral: Secondary | ICD-10-CM | POA: Diagnosis not present

## 2022-06-04 DIAGNOSIS — E119 Type 2 diabetes mellitus without complications: Secondary | ICD-10-CM | POA: Diagnosis not present

## 2022-06-04 LAB — HM DIABETES EYE EXAM

## 2022-06-17 DIAGNOSIS — H18512 Endothelial corneal dystrophy, left eye: Secondary | ICD-10-CM | POA: Diagnosis not present

## 2022-06-17 DIAGNOSIS — H25811 Combined forms of age-related cataract, right eye: Secondary | ICD-10-CM | POA: Diagnosis not present

## 2022-06-17 DIAGNOSIS — H25812 Combined forms of age-related cataract, left eye: Secondary | ICD-10-CM | POA: Diagnosis not present

## 2022-06-17 DIAGNOSIS — H18511 Endothelial corneal dystrophy, right eye: Secondary | ICD-10-CM | POA: Diagnosis not present

## 2022-07-02 DIAGNOSIS — H25811 Combined forms of age-related cataract, right eye: Secondary | ICD-10-CM | POA: Diagnosis not present

## 2022-07-02 DIAGNOSIS — H269 Unspecified cataract: Secondary | ICD-10-CM | POA: Diagnosis not present

## 2022-07-02 DIAGNOSIS — H18511 Endothelial corneal dystrophy, right eye: Secondary | ICD-10-CM | POA: Diagnosis not present

## 2022-08-01 DIAGNOSIS — Z79899 Other long term (current) drug therapy: Secondary | ICD-10-CM | POA: Diagnosis not present

## 2022-08-01 DIAGNOSIS — M154 Erosive (osteo)arthritis: Secondary | ICD-10-CM | POA: Diagnosis not present

## 2022-08-01 DIAGNOSIS — M069 Rheumatoid arthritis, unspecified: Secondary | ICD-10-CM | POA: Diagnosis not present

## 2022-08-01 DIAGNOSIS — M0579 Rheumatoid arthritis with rheumatoid factor of multiple sites without organ or systems involvement: Secondary | ICD-10-CM | POA: Diagnosis not present

## 2022-08-01 DIAGNOSIS — M153 Secondary multiple arthritis: Secondary | ICD-10-CM | POA: Diagnosis not present

## 2022-08-01 DIAGNOSIS — Z8639 Personal history of other endocrine, nutritional and metabolic disease: Secondary | ICD-10-CM | POA: Diagnosis not present

## 2022-08-01 DIAGNOSIS — R945 Abnormal results of liver function studies: Secondary | ICD-10-CM | POA: Diagnosis not present

## 2022-08-08 ENCOUNTER — Other Ambulatory Visit: Payer: Self-pay

## 2022-08-08 DIAGNOSIS — Z122 Encounter for screening for malignant neoplasm of respiratory organs: Secondary | ICD-10-CM

## 2022-08-08 DIAGNOSIS — Z87891 Personal history of nicotine dependence: Secondary | ICD-10-CM

## 2022-08-08 NOTE — Progress Notes (Signed)
LDCT order placed per protocol. Scan scheduled for 03/25 at 0800

## 2022-09-21 ENCOUNTER — Other Ambulatory Visit: Payer: Self-pay | Admitting: Internal Medicine

## 2022-09-21 DIAGNOSIS — I10 Essential (primary) hypertension: Secondary | ICD-10-CM

## 2022-09-21 DIAGNOSIS — E785 Hyperlipidemia, unspecified: Secondary | ICD-10-CM

## 2022-10-09 ENCOUNTER — Encounter: Payer: Self-pay | Admitting: Internal Medicine

## 2022-10-09 ENCOUNTER — Ambulatory Visit (INDEPENDENT_AMBULATORY_CARE_PROVIDER_SITE_OTHER): Payer: Medicare Other | Admitting: Internal Medicine

## 2022-10-09 VITALS — BP 129/76 | HR 83 | Ht 72.0 in | Wt 199.4 lb

## 2022-10-09 DIAGNOSIS — K219 Gastro-esophageal reflux disease without esophagitis: Secondary | ICD-10-CM | POA: Diagnosis not present

## 2022-10-09 DIAGNOSIS — I10 Essential (primary) hypertension: Secondary | ICD-10-CM

## 2022-10-09 DIAGNOSIS — E1169 Type 2 diabetes mellitus with other specified complication: Secondary | ICD-10-CM | POA: Diagnosis not present

## 2022-10-09 DIAGNOSIS — E559 Vitamin D deficiency, unspecified: Secondary | ICD-10-CM

## 2022-10-09 DIAGNOSIS — E785 Hyperlipidemia, unspecified: Secondary | ICD-10-CM

## 2022-10-09 DIAGNOSIS — E782 Mixed hyperlipidemia: Secondary | ICD-10-CM | POA: Diagnosis not present

## 2022-10-09 DIAGNOSIS — Z720 Tobacco use: Secondary | ICD-10-CM

## 2022-10-09 DIAGNOSIS — F17210 Nicotine dependence, cigarettes, uncomplicated: Secondary | ICD-10-CM | POA: Diagnosis not present

## 2022-10-09 DIAGNOSIS — M0579 Rheumatoid arthritis with rheumatoid factor of multiple sites without organ or systems involvement: Secondary | ICD-10-CM

## 2022-10-09 DIAGNOSIS — J439 Emphysema, unspecified: Secondary | ICD-10-CM

## 2022-10-09 MED ORDER — LISINOPRIL-HYDROCHLOROTHIAZIDE 10-12.5 MG PO TABS: 1.0000 | ORAL_TABLET | Freq: Every day | ORAL | 1 refills | Status: DC

## 2022-10-09 MED ORDER — ATORVASTATIN CALCIUM 40 MG PO TABS: 40.0000 mg | ORAL_TABLET | Freq: Every day | ORAL | 3 refills | Status: DC

## 2022-10-09 MED ORDER — OMEPRAZOLE 40 MG PO CPDR: 40.0000 mg | DELAYED_RELEASE_CAPSULE | Freq: Every day | ORAL | 1 refills | Status: DC | PRN

## 2022-10-09 NOTE — Patient Instructions (Signed)
Please continue to take medications as prescribed. ? ?Please continue to follow low carb diet and perform moderate exercise/walking at least 150 mins/week. ?

## 2022-10-09 NOTE — Assessment & Plan Note (Addendum)
Lab Results  Component Value Date   HGBA1C 6.1 04/08/2022   HGBA1C 6.1 04/08/2022   HGBA1C 6.1 04/08/2022   Overall well-controlled in the past, but has been taking metformin every other day instead of once daily Associated with HTN, HLD and GERD On Metformin 500 mg QD - had decreased to 500 mg QD as he has poor PO intake, but now takes QOD Advised to follow diabetic diet On statin and ACEi Diabetic foot exam: Today Diabetic eye exam: Advised to follow up with Ophthalmology for diabetic eye exam

## 2022-10-09 NOTE — Assessment & Plan Note (Signed)
On statin Reviewed lipid profile 

## 2022-10-09 NOTE — Assessment & Plan Note (Signed)
Noted on CT chest ?Mild dyspnea with exertion, has Albuterol PRN ?Trying to cut down smoking ?

## 2022-10-09 NOTE — Assessment & Plan Note (Signed)
Smokes about 1 pack/day.  Asked about quitting: confirms that he currently smokes cigarettes Advise to quit smoking: Educated about QUITTING to reduce the risk of cancer, cardio and cerebrovascular disease. Assess willingness: Unwilling to quit at this time, but is working on cutting back. Assist with counseling and pharmacotherapy: Counseled for 5 minutes and literature provided. Did not like Chantix in the past. Arrange for follow up: follow up in 3 months and continue to offer help. 

## 2022-10-09 NOTE — Assessment & Plan Note (Addendum)
On Plaquenil Follows up with Rheumatologist - needs CMP and ESR check, blood tests today Advised eye exam as he takes Plaquenil

## 2022-10-09 NOTE — Assessment & Plan Note (Signed)
On Omeprazole Nausea improved now 

## 2022-10-09 NOTE — Progress Notes (Signed)
Established Patient Office Visit  Subjective:  Patient ID: Darrell Terry, male    DOB: [birth year]  Age: 57 y.o. MRN: [id number]  CC:  Chief Complaint  Patient presents with   Diabetes    Six month follow up for diabetes and RA    HPI Darrell Terry is a 57 y.o. male with past medical history of HTN, DM, RA and GERD who presents for f/u of his chronic medical conditions.  GERD and nausea: His nausea has improved now. He takes Omeprazole QOD.  His weight has been stable since the last visit.  HTN: BP is well-controlled. Takes medications regularly. Patient denies headache, dizziness, chest pain, dyspnea or palpitations.  Type 2 DM: His HbA1C was 6.1.  He is taking metformin 500 mg once every other day now instead of once daily. Denies any polyuria or polydipsia.  RA: He is on Plaquenil only now. His joint pain and swelling has been the same without Humira.   Past Medical History:  Diagnosis Date   Depression    Diabetes mellitus without complication ([place])    Rheumatoid arthritis ([place])     Past Surgical History:  Procedure Laterality Date   CERVICAL SPINE SURGERY     X 3   COLONOSCOPY N/A 03/26/2015   Procedure: COLONOSCOPY;  Surgeon: Daneil Dolin, MD;  Location: [facility];  Service: Endoscopy;  Laterality: N/A;  8:00 AM   TONSILLECTOMY      Family History  Problem Relation Age of Onset   Stroke Other        undocumented which relative    High Cholesterol Other        undocumented which relative    Heart disease Other        undocumented which relative    Cancer - Other Other        undocumented which relative     Social History   Socioeconomic History   Marital status: Divorced    Spouse name: Not on file   Number of children: Not on file   Years of education: Not on file   Highest education level: Not on file  Occupational History   Not on file  Tobacco Use   Smoking status: Some Days    Packs/day: 1.00    Years: 34.00    Additional pack  years: 0.00    Total pack years: 34.00    Types: Cigarettes   Smokeless tobacco: Never  Vaping Use   Vaping Use: Never used  Substance and Sexual Activity   Alcohol use: No    Alcohol/week: 0.0 standard drinks of alcohol   Drug use: Yes    Types: Marijuana    Comment: has not used in couple months    Sexual activity: Not on file  Other Topics Concern   Not on file  Social History Narrative   ** Merged History Encounter **       Social Determinants of Health   Financial Resource Strain: Medium Risk (04/19/2021)   Overall Financial Resource Strain (CARDIA)    Difficulty of Paying Living Expenses: Somewhat hard  Food Insecurity: No Food Insecurity (04/19/2021)   Hunger Vital Sign    Worried About Running Out of Food in the Last Year: Never true    Ran Out of Food in the Last Year: Never true  Transportation Needs: No Transportation Needs (04/19/2021)   PRAPARE - Hydrologist (Medical): No    Lack of Transportation (Non-Medical): No  Physical Activity: Inactive (04/19/2021)   Exercise Vital Sign    Days of Exercise per Week: 0 days    Minutes of Exercise per Session: 0 min  Stress: Stress Concern Present (04/19/2021)   [place]    Feeling of Stress : To some extent  Social Connections: Socially Isolated (04/19/2021)   Social Connection and Isolation Panel [NHANES]    Frequency of Communication with Friends and Family: Twice a week    Frequency of Social Gatherings with Friends and Family: Never    Attends Religious Services: Never    Marine scientist or Organizations: No    Attends Archivist Meetings: Never    Marital Status: Divorced  Human resources officer Violence: Not At Risk (04/19/2021)   Humiliation, Afraid, Rape, and Kick questionnaire    Fear of Current or Ex-Partner: No    Emotionally Abused: No    Physically Abused: No    Sexually Abused: No    Outpatient  Medications Prior to Visit  Medication Sig Dispense Refill   albuterol (VENTOLIN HFA) 108 (90 Base) MCG/ACT inhaler Inhale 2 puffs into the lungs every 6 (six) hours as needed for wheezing or shortness of breath. 8 g 5   benzonatate (TESSALON) 100 MG capsule Take 1 capsule (100 mg total) by mouth 3 (three) times daily as needed for cough. 30 capsule 0   Cholecalciferol 25 MCG (1000 UT) capsule Take by mouth.     hydroxychloroquine (PLAQUENIL) 200 MG tablet Take 200 mg by mouth 2 (two) times daily.     metFORMIN (GLUCOPHAGE) 500 MG tablet Take 1 tablet (500 mg total) by mouth daily with breakfast. 90 tablet 1   Omega-3 Fatty Acids (FISH OIL) 1000 MG CAPS Take 5,000 mg by mouth daily.      predniSONE (DELTASONE) 20 MG tablet Take 2 tablets (40 mg total) by mouth daily with breakfast. 10 tablet 0   atorvastatin (LIPITOR) 40 MG tablet TAKE 1 TABLET BY MOUTH ONCE DAILY. 90 tablet 0   lisinopril-hydrochlorothiazide (ZESTORETIC) 10-12.5 MG tablet TAKE (1) TABLET BY MOUTH ONCE DAILY. 90 tablet 0   omeprazole (PRILOSEC) 40 MG capsule TAKE (1) CAPSULE BY MOUTH ONCE DAILY. 90 capsule 0   No facility-administered medications prior to visit.    No Known Allergies  ROS Review of Systems  Constitutional:  Negative for chills and fever.  HENT:  Negative for congestion and sore throat.   Eyes:  Negative for pain and discharge.  Respiratory:  Negative for cough and shortness of breath.   Cardiovascular:  Negative for chest pain, palpitations and leg swelling.  Gastrointestinal:  Negative for constipation, diarrhea and vomiting.  Endocrine: Negative for polydipsia and polyuria.  Genitourinary:  Negative for dysuria and hematuria.  Musculoskeletal:  Positive for arthralgias, back pain and neck pain. Negative for neck stiffness.  Skin:  Negative for rash.  Neurological:  Negative for dizziness, weakness, numbness and headaches.  Psychiatric/Behavioral:  Positive for dysphoric mood and sleep disturbance.  Negative for agitation, behavioral problems and suicidal ideas. The patient is nervous/anxious.       Objective:    Physical Exam Vitals reviewed.  Constitutional:      General: He is not in acute distress.    Appearance: He is not diaphoretic.  HENT:     Head: Normocephalic and atraumatic.     Nose: Nose normal.     Mouth/Throat:     Mouth: Mucous membranes are moist.  Eyes:  General: No scleral icterus.    Extraocular Movements: Extraocular movements intact.  Cardiovascular:     Rate and Rhythm: Normal rate and regular rhythm.     Pulses: Normal pulses.     Heart sounds: Normal heart sounds. No murmur heard.    No friction rub. No gallop.  Pulmonary:     Breath sounds: Normal breath sounds. No wheezing or rales.  Abdominal:     Palpations: Abdomen is soft.     Tenderness: There is no abdominal tenderness.     Hernia: A hernia (Umbilical) is present.  Musculoskeletal:        General: No swelling. Normal range of motion.     Cervical back: Neck supple. No tenderness.     Right lower leg: No edema.     Left lower leg: No edema.  Skin:    General: Skin is warm.     Findings: No rash.  Neurological:     General: No focal deficit present.     Mental Status: He is alert and oriented to person, place, and time.     Sensory: No sensory deficit.     Motor: No weakness.  Psychiatric:        Mood and Affect: Mood normal.        Behavior: Behavior normal.     BP 129/76 (BP Location: Right Arm, Patient Position: Sitting, Cuff Size: Normal)   Pulse 83   Ht 6' (1.829 m)   Wt 199 lb 6.4 oz (90.4 kg)   SpO2 96%   BMI 27.04 kg/m  Wt Readings from Last 3 Encounters:  10/09/22 199 lb 6.4 oz (90.4 kg)  04/08/22 194 lb 9.6 oz (88.3 kg)  01/07/22 199 lb 9.6 oz (90.5 kg)    Lab Results  Component Value Date   TSH 2.010 12/10/2020   Lab Results  Component Value Date   WBC 7.2 11/29/2021   HGB 14.1 11/29/2021   HCT 42.6 11/29/2021   MCV 84 11/29/2021   PLT 169  11/29/2021   Lab Results  Component Value Date   NA 139 11/29/2021   K 4.5 11/29/2021   CO2 24 11/29/2021   GLUCOSE 125 (H) 11/29/2021   BUN 9 11/29/2021   CREATININE 1.17 11/29/2021   BILITOT 0.3 11/29/2021   ALKPHOS 104 11/29/2021   AST 17 11/29/2021   ALT 10 11/29/2021   PROT 7.1 11/29/2021   ALBUMIN 4.3 11/29/2021   CALCIUM 9.5 11/29/2021   EGFR 73 11/29/2021   Lab Results  Component Value Date   CHOL 94 (L) 11/29/2021   Lab Results  Component Value Date   HDL 34 (L) 11/29/2021   Lab Results  Component Value Date   LDLCALC 34 11/29/2021   Lab Results  Component Value Date   TRIG 152 (H) 11/29/2021   Lab Results  Component Value Date   CHOLHDL 2.8 11/29/2021   Lab Results  Component Value Date   HGBA1C 6.1 04/08/2022   HGBA1C 6.1 04/08/2022   HGBA1C 6.1 04/08/2022      Assessment & Plan:   Problem List Items Addressed This Visit       Cardiovascular and Mediastinum   Hypertension    BP Readings from Last 1 Encounters:  10/09/22 129/76  Well-controlled with Lisinopril-HCTZ now Counseled for compliance with the medications Advised DASH diet and moderate exercise/walking, at least 150 mins/week      Relevant Medications   atorvastatin (LIPITOR) 40 MG tablet   lisinopril-hydrochlorothiazide (ZESTORETIC) 10-12.5 MG tablet  Respiratory   Pulmonary emphysema ([place]) - Primary    Noted on CT chest Mild dyspnea with exertion, has Albuterol PRN Trying to cut down smoking        Digestive   GERD (gastroesophageal reflux disease)    On Omeprazole Nausea improved now      Relevant Medications   omeprazole (PRILOSEC) 40 MG capsule     Endocrine   DM (diabetes mellitus) ([place])    Lab Results  Component Value Date   HGBA1C 6.1 04/08/2022   HGBA1C 6.1 04/08/2022   HGBA1C 6.1 04/08/2022  Overall well-controlled in the past, but has been taking metformin every other day instead of once daily Associated with HTN, HLD and GERD On Metformin 500  mg QD - had decreased to 500 mg QD as he has poor PO intake, but now takes QOD Advised to follow diabetic diet On statin and ACEi Diabetic foot exam: Today Diabetic eye exam: Advised to follow up with Ophthalmology for diabetic eye exam      Relevant Medications   atorvastatin (LIPITOR) 40 MG tablet   lisinopril-hydrochlorothiazide (ZESTORETIC) 10-12.5 MG tablet   Other Relevant Orders   Hemoglobin A1c   CMP14+EGFR     Musculoskeletal and Integument   Rheumatoid arthritis involving multiple sites with positive rheumatoid factor ([place])    On Plaquenil Follows up with Rheumatologist - needs CMP and ESR check, blood tests today Advised eye exam as he takes Plaquenil      Relevant Orders   CMP14+EGFR   CBC with Differential/Platelet   Sed Rate (ESR)     Other   Hyperlipidemia    On statin Reviewed lipid profile      Relevant Medications   atorvastatin (LIPITOR) 40 MG tablet   lisinopril-hydrochlorothiazide (ZESTORETIC) 10-12.5 MG tablet   Other Relevant Orders   Lipid panel   Tobacco abuse    Smokes about 1 pack/day.  Asked about quitting: confirms that he currently smokes cigarettes Advise to quit smoking: Educated about QUITTING to reduce the risk of cancer, cardio and cerebrovascular disease. Assess willingness: Unwilling to quit at this time, but is working on cutting back. Assist with counseling and pharmacotherapy: Counseled for 5 minutes and literature provided. Did not like Chantix in the past. Arrange for follow up: follow up in 3 months and continue to offer help.      Other Visit Diagnoses     Vitamin D deficiency       Relevant Orders   VITAMIN D 25 Hydroxy (Vit-D Deficiency, Fractures)      Meds ordered this encounter  Medications   atorvastatin (LIPITOR) 40 MG tablet    Sig: Take 1 tablet (40 mg total) by mouth daily.    Dispense:  90 tablet    Refill:  3   lisinopril-hydrochlorothiazide (ZESTORETIC) 10-12.5 MG tablet    Sig: Take 1 tablet by  mouth daily.    Dispense:  90 tablet    Refill:  1   omeprazole (PRILOSEC) 40 MG capsule    Sig: Take 1 capsule (40 mg total) by mouth daily as needed (Acid reflux).    Dispense:  90 capsule    Refill:  1    Follow-up: Return in about 4 months (around 02/08/2023).    Lindell Spar, MD

## 2022-10-09 NOTE — Assessment & Plan Note (Signed)
BP Readings from Last 1 Encounters:  10/09/22 129/76   Well-controlled with Lisinopril-HCTZ now Counseled for compliance with the medications Advised DASH diet and moderate exercise/walking, at least 150 mins/week

## 2022-10-10 LAB — CMP14+EGFR
ALT: 12 IU/L (ref 0–44)
AST: 20 IU/L (ref 0–40)
Albumin/Globulin Ratio: 1.5 (ref 1.2–2.2)
Albumin: 4.6 g/dL (ref 3.8–4.9)
Alkaline Phosphatase: 110 IU/L (ref 44–121)
BUN/Creatinine Ratio: 12 (ref 9–20)
BUN: 15 mg/dL (ref 6–24)
Bilirubin Total: 0.3 mg/dL (ref 0.0–1.2)
CO2: 23 mmol/L (ref 20–29)
Calcium: 9.8 mg/dL (ref 8.7–10.2)
Chloride: 100 mmol/L (ref 96–106)
Creatinine, Ser: 1.3 mg/dL — ABNORMAL HIGH (ref 0.76–1.27)
Globulin, Total: 3.1 g/dL (ref 1.5–4.5)
Glucose: 156 mg/dL — ABNORMAL HIGH (ref 70–99)
Potassium: 4.9 mmol/L (ref 3.5–5.2)
Sodium: 137 mmol/L (ref 134–144)
Total Protein: 7.7 g/dL (ref 6.0–8.5)
eGFR: 64 mL/min/{1.73_m2} (ref >59)

## 2022-10-10 LAB — CBC WITH DIFFERENTIAL/PLATELET
Basophils Absolute: 0 10*3/uL (ref 0.0–0.2)
Basos: 1 %
EOS (ABSOLUTE): 0.3 10*3/uL (ref 0.0–0.4)
Eos: 4 %
Hematocrit: 46.4 % (ref 37.5–51.0)
Hemoglobin: 15.5 g/dL (ref 13.0–17.7)
Immature Grans (Abs): 0 10*3/uL (ref 0.0–0.1)
Immature Granulocytes: 0 %
Lymphocytes Absolute: 2.5 10*3/uL (ref 0.7–3.1)
Lymphs: 34 %
MCH: 27.5 pg (ref 26.6–33.0)
MCHC: 33.4 g/dL (ref 31.5–35.7)
MCV: 82 fL (ref 79–97)
Monocytes Absolute: 0.7 10*3/uL (ref 0.1–0.9)
Monocytes: 9 %
Neutrophils Absolute: 3.7 10*3/uL (ref 1.4–7.0)
Neutrophils: 52 %
Platelets: 187 10*3/uL (ref 150–450)
RBC: 5.64 x10E6/uL (ref 4.14–5.80)
RDW: 14.1 % (ref 11.6–15.4)
WBC: 7.2 10*3/uL (ref 3.4–10.8)

## 2022-10-10 LAB — LIPID PANEL
Chol/HDL Ratio: 3.9 ratio (ref 0.0–5.0)
Cholesterol, Total: 136 mg/dL (ref 100–199)
HDL: 35 mg/dL — ABNORMAL LOW (ref >39)
LDL Chol Calc (NIH): 63 mg/dL (ref 0–99)
Triglycerides: 234 mg/dL — ABNORMAL HIGH (ref 0–149)
VLDL Cholesterol Cal: 38 mg/dL (ref 5–40)

## 2022-10-10 LAB — SEDIMENTATION RATE: Sed Rate: 4 mm/hr (ref 0–30)

## 2022-10-10 LAB — HEMOGLOBIN A1C
Est. average glucose Bld gHb Est-mCnc: 140 mg/dL
Hgb A1c MFr Bld: 6.5 % — ABNORMAL HIGH (ref 4.8–5.6)

## 2022-10-10 LAB — VITAMIN D 25 HYDROXY (VIT D DEFICIENCY, FRACTURES): Vit D, 25-Hydroxy: 34.5 ng/mL (ref 30.0–100.0)

## 2022-10-20 ENCOUNTER — Ambulatory Visit (HOSPITAL_COMMUNITY)
Admission: RE | Admit: 2022-10-20 | Discharge: 2022-10-20 | Disposition: A | Discharge status: Home / Self Care | Payer: Medicare Other | Source: Ambulatory Visit | Attending: Physician Assistant | Admitting: Physician Assistant

## 2022-10-20 DIAGNOSIS — Z122 Encounter for screening for malignant neoplasm of respiratory organs: Secondary | ICD-10-CM | POA: Diagnosis not present

## 2022-10-20 DIAGNOSIS — Z87891 Personal history of nicotine dependence: Secondary | ICD-10-CM | POA: Diagnosis not present

## 2022-10-31 NOTE — Progress Notes (Signed)
Patient notified of LDCT Lung Cancer Screening Results via mail with the recommendation to follow-up in 12 months. Patient's referring provider has been sent a copy of results. Results are as follows:  IMPRESSION: 1. Lung-RADS 2, benign appearance or behavior. Continue annual screening with low-dose chest CT without contrast in 12 months. 2. Coronary artery atherosclerosis. 3.  Emphysema (ICD10-J43.9) and Aortic Atherosclerosis (ICD10-170.0)

## 2023-01-05 DIAGNOSIS — M0579 Rheumatoid arthritis with rheumatoid factor of multiple sites without organ or systems involvement: Secondary | ICD-10-CM | POA: Diagnosis not present

## 2023-01-05 DIAGNOSIS — Z79899 Other long term (current) drug therapy: Secondary | ICD-10-CM | POA: Diagnosis not present

## 2023-01-05 DIAGNOSIS — M153 Secondary multiple arthritis: Secondary | ICD-10-CM | POA: Diagnosis not present

## 2023-02-02 ENCOUNTER — Other Ambulatory Visit: Payer: Self-pay | Admitting: Internal Medicine

## 2023-02-02 DIAGNOSIS — E1169 Type 2 diabetes mellitus with other specified complication: Secondary | ICD-10-CM

## 2023-02-12 DIAGNOSIS — Z79899 Other long term (current) drug therapy: Secondary | ICD-10-CM | POA: Diagnosis not present

## 2023-02-12 LAB — HM DIABETES EYE EXAM

## 2023-02-16 ENCOUNTER — Encounter: Payer: Self-pay | Admitting: Internal Medicine

## 2023-02-16 ENCOUNTER — Ambulatory Visit (INDEPENDENT_AMBULATORY_CARE_PROVIDER_SITE_OTHER): Payer: Medicare Other | Admitting: Internal Medicine

## 2023-02-16 VITALS — BP 134/72 | HR 78 | Ht 72.0 in | Wt 198.6 lb

## 2023-02-16 DIAGNOSIS — F331 Major depressive disorder, recurrent, moderate: Secondary | ICD-10-CM

## 2023-02-16 DIAGNOSIS — I10 Essential (primary) hypertension: Secondary | ICD-10-CM | POA: Diagnosis not present

## 2023-02-16 DIAGNOSIS — Z720 Tobacco use: Secondary | ICD-10-CM | POA: Diagnosis not present

## 2023-02-16 DIAGNOSIS — E1169 Type 2 diabetes mellitus with other specified complication: Secondary | ICD-10-CM | POA: Diagnosis not present

## 2023-02-16 DIAGNOSIS — E782 Mixed hyperlipidemia: Secondary | ICD-10-CM

## 2023-02-16 DIAGNOSIS — F17210 Nicotine dependence, cigarettes, uncomplicated: Secondary | ICD-10-CM | POA: Diagnosis not present

## 2023-02-16 DIAGNOSIS — M0579 Rheumatoid arthritis with rheumatoid factor of multiple sites without organ or systems involvement: Secondary | ICD-10-CM | POA: Diagnosis not present

## 2023-02-16 MED ORDER — BUPROPION HCL ER (XL) 150 MG PO TB24
150.0000 mg | ORAL_TABLET | Freq: Every day | ORAL | 3 refills | Status: DC
Start: 2023-02-16 — End: 2023-06-19

## 2023-02-16 NOTE — Patient Instructions (Addendum)
Please start taking Wellbutrin half tablet for 1 week and then 1 tablet once daily.  Please continue to take medications as prescribed.  Please continue to follow low carb diet and perform moderate exercise/walking at least 150 mins/week.  Please consider getting Shingrix and Tdap vaccine at local pharmacy.

## 2023-02-16 NOTE — Assessment & Plan Note (Addendum)
Started Wellbutrin 150 mg QD for MDD and smoking cessation Denies BH therapy referral Does not have family or friends in the community, but does not want help No SI or HI

## 2023-02-16 NOTE — Assessment & Plan Note (Signed)
Smokes about 1 pack/day. Recently up to 2 pack/day.  Asked about quitting: confirms that he currently smokes cigarettes Advise to quit smoking: Educated about QUITTING to reduce the risk of cancer, cardio and cerebrovascular disease. Assess willingness: Unwilling to quit at this time, but is working on cutting back. Assist with counseling and pharmacotherapy: Counseled for 5 minutes and literature provided. Did not like Chantix in the past. Started Wellbutrin 150 mg QD Arrange for follow up: follow up in 3 months and continue to offer help.

## 2023-02-16 NOTE — Assessment & Plan Note (Signed)
On statin Reviewed lipid profile

## 2023-02-16 NOTE — Assessment & Plan Note (Addendum)
On Plaquenil Follows up with Rheumatologist - blood tests today Advised eye exam as he takes Plaquenil

## 2023-02-16 NOTE — Assessment & Plan Note (Addendum)
BP Readings from Last 1 Encounters:  02/16/23 134/72   Well-controlled with Lisinopril-HCTZ now Counseled for compliance with the medications Advised DASH diet and moderate exercise/walking, at least 150 mins/week

## 2023-02-16 NOTE — Assessment & Plan Note (Addendum)
Lab Results  Component Value Date   HGBA1C 6.5 (H) 10/09/2022   Overall well-controlled Associated with HTN, HLD and GERD On Metformin 500 mg QD Advised to follow diabetic diet On statin and ACEi Diabetic foot exam: Today Diabetic eye exam: Advised to follow up with Ophthalmology for diabetic eye exam

## 2023-02-16 NOTE — Progress Notes (Signed)
Established Patient Office Visit  Subjective:  Patient ID: Darrell Terry, male    DOB: 07-Oct-1965  Age: 57 y.o. MRN: [id number]  CC:  Chief Complaint  Patient presents with   Diabetes    Four month follow up     HPI Darrell Terry is a 57 y.o. male with past medical history of HTN, DM, RA and GERD who presents for f/u of his chronic medical conditions.  GERD and nausea: His nausea has improved now. He takes Omeprazole QOD.  His weight has been stable since the last visit.  HTN: BP is well-controlled. Takes medications regularly. Patient denies headache, dizziness, chest pain, dyspnea or palpitations.  Type 2 DM: His HbA1C was 6.5 in 03/24.  He is taking metformin 500 mg once daily now. Denies any polyuria or polydipsia.  RA: He is on Plaquenil only now. His joint pain and swelling has been the same without Humira.  MDD and GAD: He reports anxiety and feeling low most of the time.  He lives alone and does not have local family or friends.  He has increased smoking due to feeling anxious.  He also reports insomnia and lack of appetite.  He agrees to take Wellbutrin that can also help him with smoking cessation.   Past Medical History:  Diagnosis Date   Depression    Diabetes mellitus without complication (HCC)    Rheumatoid arthritis (HCC)     Past Surgical History:  Procedure Laterality Date   CERVICAL SPINE SURGERY     X 3   COLONOSCOPY N/A 03/26/2015   Procedure: COLONOSCOPY;  Surgeon: Corbin Ade, MD;  Location: [facility];  Service: Endoscopy;  Laterality: N/A;  8:00 AM   TONSILLECTOMY      Family History  Problem Relation Age of Onset   Stroke Other        undocumented which relative    High Cholesterol Other        undocumented which relative    Heart disease Other        undocumented which relative    Cancer - Other Other        undocumented which relative     Social History   Socioeconomic History   Marital status: Divorced    Spouse name:  Not on file   Number of children: Not on file   Years of education: Not on file   Highest education level: Not on file  Occupational History   Not on file  Tobacco Use   Smoking status: Some Days    Current packs/day: 1.00    Average packs/day: 1 pack/day for 34.0 years (34.0 ttl pk-yrs)    Types: Cigarettes   Smokeless tobacco: Never  Vaping Use   Vaping status: Never Used  Substance and Sexual Activity   Alcohol use: No    Alcohol/week: 0.0 standard drinks of alcohol   Drug use: Yes    Types: Marijuana    Comment: has not used in couple months    Sexual activity: Not on file  Other Topics Concern   Not on file  Social History Narrative   ** Merged History Encounter **       Social Determinants of Health   Financial Resource Strain: Medium Risk (04/19/2021)   Overall Financial Resource Strain (CARDIA)    Difficulty of Paying Living Expenses: Somewhat hard  Food Insecurity: No Food Insecurity (04/19/2021)   Hunger Vital Sign    Worried About Running Out of Food in  the Last Year: Never true    Ran Out of Food in the Last Year: Never true  Transportation Needs: No Transportation Needs (04/19/2021)   PRAPARE - Administrator, Civil Service (Medical): No    Lack of Transportation (Non-Medical): No  Physical Activity: Inactive (04/19/2021)   Exercise Vital Sign    Days of Exercise per Week: 0 days    Minutes of Exercise per Session: 0 min  Stress: Stress Concern Present (04/19/2021)   [employer] of Occupational Health - Occupational Stress Questionnaire    Feeling of Stress : To some extent  Social Connections: Socially Isolated (04/19/2021)   Social Connection and Isolation Panel [NHANES]    Frequency of Communication with Friends and Family: Twice a week    Frequency of Social Gatherings with Friends and Family: Never    Attends Religious Services: Never    Database administrator or Organizations: No    Attends Banker Meetings: Never     Marital Status: Divorced  Catering manager Violence: Not At Risk (04/19/2021)   Humiliation, Afraid, Rape, and Kick questionnaire    Fear of Current or Ex-Partner: No    Emotionally Abused: No    Physically Abused: No    Sexually Abused: No    Outpatient Medications Prior to Visit  Medication Sig Dispense Refill   albuterol (VENTOLIN HFA) 108 (90 Base) MCG/ACT inhaler Inhale 2 puffs into the lungs every 6 (six) hours as needed for wheezing or shortness of breath. 8 g 5   atorvastatin (LIPITOR) 40 MG tablet Take 1 tablet (40 mg total) by mouth daily. 90 tablet 3   benzonatate (TESSALON) 100 MG capsule Take 1 capsule (100 mg total) by mouth 3 (three) times daily as needed for cough. 30 capsule 0   Cholecalciferol 25 MCG (1000 UT) capsule Take by mouth.     hydroxychloroquine (PLAQUENIL) 200 MG tablet Take 200 mg by mouth 2 (two) times daily.     lisinopril-hydrochlorothiazide (ZESTORETIC) 10-12.5 MG tablet Take 1 tablet by mouth daily. 90 tablet 1   metFORMIN (GLUCOPHAGE) 500 MG tablet TAKE 1 TABLET BY MOUTH ONCE DAILY. 90 tablet 0   Omega-3 Fatty Acids (FISH OIL) 1000 MG CAPS Take 5,000 mg by mouth daily.      omeprazole (PRILOSEC) 40 MG capsule Take 1 capsule (40 mg total) by mouth daily as needed (Acid reflux). 90 capsule 1   predniSONE (DELTASONE) 20 MG tablet Take 2 tablets (40 mg total) by mouth daily with breakfast. 10 tablet 0   No facility-administered medications prior to visit.    No Known Allergies  ROS Review of Systems  Constitutional:  Negative for chills and fever.  HENT:  Negative for congestion and sore throat.   Eyes:  Negative for pain and discharge.  Respiratory:  Negative for cough and shortness of breath.   Cardiovascular:  Negative for chest pain, palpitations and leg swelling.  Gastrointestinal:  Negative for constipation, diarrhea and vomiting.  Endocrine: Negative for polydipsia and polyuria.  Genitourinary:  Negative for dysuria and hematuria.   Musculoskeletal:  Positive for arthralgias, back pain and neck pain. Negative for neck stiffness.  Skin:  Negative for rash.  Neurological:  Negative for dizziness, weakness, numbness and headaches.  Psychiatric/Behavioral:  Positive for dysphoric mood and sleep disturbance. Negative for agitation, behavioral problems and suicidal ideas. The patient is nervous/anxious.       Objective:    Physical Exam Vitals reviewed.  Constitutional:  General: He is not in acute distress.    Appearance: He is not diaphoretic.  HENT:     Head: Normocephalic and atraumatic.     Nose: Nose normal.     Mouth/Throat:     Mouth: Mucous membranes are moist.  Eyes:     General: No scleral icterus.    Extraocular Movements: Extraocular movements intact.  Cardiovascular:     Rate and Rhythm: Normal rate and regular rhythm.     Pulses: Normal pulses.     Heart sounds: Normal heart sounds. No murmur heard.    No friction rub. No gallop.  Pulmonary:     Breath sounds: Normal breath sounds. No wheezing or rales.  Abdominal:     Palpations: Abdomen is soft.     Tenderness: There is no abdominal tenderness.     Hernia: A hernia (Umbilical) is present.  Musculoskeletal:        General: No swelling. Normal range of motion.     Cervical back: Neck supple. No tenderness.     Right lower leg: No edema.     Left lower leg: No edema.  Skin:    General: Skin is warm.     Findings: No rash.  Neurological:     General: No focal deficit present.     Mental Status: He is alert and oriented to person, place, and time.     Sensory: No sensory deficit.     Motor: No weakness.  Psychiatric:        Mood and Affect: Mood normal.        Behavior: Behavior normal.     BP 134/72 (BP Location: Left Arm, Patient Position: Sitting, Cuff Size: Normal)   Pulse 78   Ht 6' (1.829 m)   Wt 198 lb 9.6 oz (90.1 kg)   SpO2 97%   BMI 26.94 kg/m  Wt Readings from Last 3 Encounters:  02/16/23 198 lb 9.6 oz (90.1 kg)   10/09/22 199 lb 6.4 oz (90.4 kg)  04/08/22 194 lb 9.6 oz (88.3 kg)    Lab Results  Component Value Date   TSH 2.010 12/10/2020   Lab Results  Component Value Date   WBC 7.2 10/09/2022   HGB 15.5 10/09/2022   HCT 46.4 10/09/2022   MCV 82 10/09/2022   PLT 187 10/09/2022   Lab Results  Component Value Date   NA 137 10/09/2022   K 4.9 10/09/2022   CO2 23 10/09/2022   GLUCOSE 156 (H) 10/09/2022   BUN 15 10/09/2022   CREATININE 1.30 (H) 10/09/2022   BILITOT 0.3 10/09/2022   ALKPHOS 110 10/09/2022   AST 20 10/09/2022   ALT 12 10/09/2022   PROT 7.7 10/09/2022   ALBUMIN 4.6 10/09/2022   CALCIUM 9.8 10/09/2022   EGFR 64 10/09/2022   Lab Results  Component Value Date   CHOL 136 10/09/2022   Lab Results  Component Value Date   HDL 35 (L) 10/09/2022   Lab Results  Component Value Date   LDLCALC 63 10/09/2022   Lab Results  Component Value Date   TRIG 234 (H) 10/09/2022   Lab Results  Component Value Date   CHOLHDL 3.9 10/09/2022   Lab Results  Component Value Date   HGBA1C 6.5 (H) 10/09/2022      Assessment & Plan:   Problem List Items Addressed This Visit       Cardiovascular and Mediastinum   Hypertension    BP Readings from Last 1 Encounters:  02/16/23 134/72  Well-controlled with Lisinopril-HCTZ now Counseled for compliance with the medications Advised DASH diet and moderate exercise/walking, at least 150 mins/week        Endocrine   DM (diabetes mellitus) (HCC) - Primary    Lab Results  Component Value Date   HGBA1C 6.5 (H) 10/09/2022   Overall well-controlled Associated with HTN, HLD and GERD On Metformin 500 mg QD Advised to follow diabetic diet On statin and ACEi Diabetic foot exam: Today Diabetic eye exam: Advised to follow up with Ophthalmology for diabetic eye exam      Relevant Orders   CMP14+EGFR   Hemoglobin A1c   Urine Microalbumin w/creat. ratio     Musculoskeletal and Integument   Rheumatoid arthritis involving  multiple sites with positive rheumatoid factor (HCC)    On Plaquenil Follows up with Rheumatologist - blood tests today Advised eye exam as he takes Plaquenil        Other   Hyperlipidemia    On statin Reviewed lipid profile      Tobacco abuse    Smokes about 1 pack/day. Recently up to 2 pack/day.  Asked about quitting: confirms that he currently smokes cigarettes Advise to quit smoking: Educated about QUITTING to reduce the risk of cancer, cardio and cerebrovascular disease. Assess willingness: Unwilling to quit at this time, but is working on cutting back. Assist with counseling and pharmacotherapy: Counseled for 5 minutes and literature provided. Did not like Chantix in the past. Started Wellbutrin 150 mg QD Arrange for follow up: follow up in 3 months and continue to offer help.      Relevant Medications   buPROPion (WELLBUTRIN XL) 150 MG 24 hr tablet   Moderate episode of recurrent major depressive disorder (HCC)    Started Wellbutrin 150 mg QD for MDD and smoking cessation Denies BH therapy referral Does not have family or friends in the community, but does not want help No SI or HI      Relevant Medications   buPROPion (WELLBUTRIN XL) 150 MG 24 hr tablet    Meds ordered this encounter  Medications   buPROPion (WELLBUTRIN XL) 150 MG 24 hr tablet    Sig: Take 1 tablet (150 mg total) by mouth daily.    Dispense:  30 tablet    Refill:  3    Follow-up: Return in about 4 months (around 06/19/2023) for DM and HTN.    Anabel Halon, MD

## 2023-02-17 LAB — CMP14+EGFR
ALT: 14 IU/L (ref 0–44)
AST: 20 IU/L (ref 0–40)
Albumin: 4.6 g/dL (ref 3.8–4.9)
Alkaline Phosphatase: 111 IU/L (ref 44–121)
BUN/Creatinine Ratio: 13 (ref 9–20)
CO2: 23 mmol/L (ref 20–29)
Globulin, Total: 2.9 g/dL (ref 1.5–4.5)
Potassium: 5.3 mmol/L — ABNORMAL HIGH (ref 3.5–5.2)
Total Protein: 7.5 g/dL (ref 6.0–8.5)
eGFR: 72 mL/min/{1.73_m2} (ref >59)

## 2023-02-17 LAB — MICROALBUMIN / CREATININE URINE RATIO

## 2023-02-17 LAB — HEMOGLOBIN A1C: Est. average glucose Bld gHb Est-mCnc: 134 mg/dL

## 2023-02-18 LAB — CMP14+EGFR
BUN: 15 mg/dL (ref 6–24)
Bilirubin Total: 0.3 mg/dL (ref 0.0–1.2)
Calcium: 9.7 mg/dL (ref 8.7–10.2)
Chloride: 102 mmol/L (ref 96–106)
Creatinine, Ser: 1.17 mg/dL (ref 0.76–1.27)
Glucose: 128 mg/dL — ABNORMAL HIGH (ref 70–99)
Sodium: 141 mmol/L (ref 134–144)

## 2023-02-18 LAB — HEMOGLOBIN A1C: Hgb A1c MFr Bld: 6.3 % — ABNORMAL HIGH (ref 4.8–5.6)

## 2023-03-06 ENCOUNTER — Encounter: Payer: Self-pay | Admitting: Internal Medicine

## 2023-03-17 ENCOUNTER — Telehealth: Payer: Self-pay | Admitting: *Deleted

## 2023-03-17 NOTE — Progress Notes (Signed)
  Care Coordination   Note   03/17/2023 Name: Darrell Terry MRN: [id number] DOB: [birth year]  [name] is a 57 y.o. year old male who sees Anabel Halon, MD for primary care. I reached out to [name] by phone today to offer care coordination services.  Mr. Bily was given information about Care Coordination services today including:   The Care Coordination services include support from the care team which includes your Nurse Coordinator, Clinical Social Worker, or Pharmacist.  The Care Coordination team is here to help remove barriers to the health concerns and goals most important to you. Care Coordination services are voluntary, and the patient may decline or stop services at any time by request to their care team member.   Care Coordination Consent Status: Patient did not agree to participate in care coordination services at this time.    Encounter Outcome:  Pt. Refused  [facility] Coordination Care Guide  Direct Dial: [phone]

## 2023-06-10 ENCOUNTER — Other Ambulatory Visit: Payer: Self-pay | Admitting: Internal Medicine

## 2023-06-10 ENCOUNTER — Encounter: Payer: Self-pay | Admitting: Internal Medicine

## 2023-06-10 DIAGNOSIS — E1169 Type 2 diabetes mellitus with other specified complication: Secondary | ICD-10-CM

## 2023-06-16 DIAGNOSIS — E1169 Type 2 diabetes mellitus with other specified complication: Secondary | ICD-10-CM | POA: Diagnosis not present

## 2023-06-17 LAB — BASIC METABOLIC PANEL
BUN/Creatinine Ratio: 11 (ref 9–20)
BUN: 15 mg/dL (ref 6–24)
CO2: 25 mmol/L (ref 20–29)
Calcium: 9.8 mg/dL (ref 8.7–10.2)
Chloride: 100 mmol/L (ref 96–106)
Creatinine, Ser: 1.36 mg/dL — ABNORMAL HIGH (ref 0.76–1.27)
Glucose: 127 mg/dL — ABNORMAL HIGH (ref 70–99)
Potassium: 5.1 mmol/L (ref 3.5–5.2)
Sodium: 140 mmol/L (ref 134–144)
eGFR: 60 mL/min/{1.73_m2} (ref >59)

## 2023-06-17 LAB — HEMOGLOBIN A1C
Est. average glucose Bld gHb Est-mCnc: 140 mg/dL
Hgb A1c MFr Bld: 6.5 % — ABNORMAL HIGH (ref 4.8–5.6)

## 2023-06-19 ENCOUNTER — Ambulatory Visit (INDEPENDENT_AMBULATORY_CARE_PROVIDER_SITE_OTHER): Payer: Medicare Other | Admitting: Internal Medicine

## 2023-06-19 ENCOUNTER — Encounter: Payer: Self-pay | Admitting: Internal Medicine

## 2023-06-19 VITALS — BP 122/79 | HR 92 | Ht 72.0 in | Wt 201.4 lb

## 2023-06-19 DIAGNOSIS — F331 Major depressive disorder, recurrent, moderate: Secondary | ICD-10-CM | POA: Diagnosis not present

## 2023-06-19 DIAGNOSIS — Z720 Tobacco use: Secondary | ICD-10-CM | POA: Diagnosis not present

## 2023-06-19 DIAGNOSIS — Z23 Encounter for immunization: Secondary | ICD-10-CM | POA: Diagnosis not present

## 2023-06-19 DIAGNOSIS — E1169 Type 2 diabetes mellitus with other specified complication: Secondary | ICD-10-CM | POA: Diagnosis not present

## 2023-06-19 DIAGNOSIS — J439 Emphysema, unspecified: Secondary | ICD-10-CM | POA: Diagnosis not present

## 2023-06-19 DIAGNOSIS — Z7984 Long term (current) use of oral hypoglycemic drugs: Secondary | ICD-10-CM | POA: Diagnosis not present

## 2023-06-19 DIAGNOSIS — M0579 Rheumatoid arthritis with rheumatoid factor of multiple sites without organ or systems involvement: Secondary | ICD-10-CM | POA: Diagnosis not present

## 2023-06-19 DIAGNOSIS — E875 Hyperkalemia: Secondary | ICD-10-CM | POA: Insufficient documentation

## 2023-06-19 DIAGNOSIS — K219 Gastro-esophageal reflux disease without esophagitis: Secondary | ICD-10-CM | POA: Diagnosis not present

## 2023-06-19 DIAGNOSIS — I10 Essential (primary) hypertension: Secondary | ICD-10-CM | POA: Diagnosis not present

## 2023-06-19 MED ORDER — BUPROPION HCL ER (XL) 150 MG PO TB24
150.0000 mg | ORAL_TABLET | Freq: Every day | ORAL | 3 refills | Status: DC
Start: 2023-06-19 — End: 2023-10-20

## 2023-06-19 MED ORDER — LISINOPRIL-HYDROCHLOROTHIAZIDE 10-12.5 MG PO TABS: 1.0000 | ORAL_TABLET | Freq: Every day | ORAL | 1 refills | Status: DC

## 2023-06-19 MED ORDER — OMEPRAZOLE 40 MG PO CPDR: 40.0000 mg | DELAYED_RELEASE_CAPSULE | Freq: Every day | ORAL | 1 refills | Status: DC | PRN

## 2023-06-19 MED ORDER — METFORMIN HCL 500 MG PO TABS
500.0000 mg | ORAL_TABLET | Freq: Every day | ORAL | 1 refills | Status: DC
Start: 2023-06-19 — End: 2024-03-04

## 2023-06-19 NOTE — Assessment & Plan Note (Signed)
Noted on CT chest ?Mild dyspnea with exertion, has Albuterol PRN ?Trying to cut down smoking ?

## 2023-06-19 NOTE — Assessment & Plan Note (Addendum)
On Plaquenil Follows up with Rheumatologist Advised eye exam as he takes Plaquenil

## 2023-06-19 NOTE — Assessment & Plan Note (Signed)
Lab Results  Component Value Date   HGBA1C 6.5 (H) 06/16/2023   Overall well-controlled Associated with HTN, HLD and GERD On Metformin 500 mg QD Advised to follow diabetic diet On statin and ACEi Diabetic eye exam: Advised to follow up with Ophthalmology for diabetic eye exam

## 2023-06-19 NOTE — Assessment & Plan Note (Signed)
BP Readings from Last 1 Encounters:  06/19/23 122/79   Well-controlled with Lisinopril-HCTZ now Counseled for compliance with the medications Advised DASH diet and moderate exercise/walking, at least 150 mins/week

## 2023-06-19 NOTE — Assessment & Plan Note (Addendum)
Had started Wellbutrin 150 mg QD for MDD and smoking cessation,  but did not continue - agrees to restart Wellbutrin Denies BH therapy referral Does not have family or friends in the community, but does not want help No SI or HI

## 2023-06-19 NOTE — Assessment & Plan Note (Signed)
CMP in 07/24 showed K - 5.4, now better at 5.1 Advised to cut down potassium rich food

## 2023-06-19 NOTE — Assessment & Plan Note (Signed)
On Omeprazole Nausea improved now

## 2023-06-19 NOTE — Progress Notes (Signed)
Established Patient Office Visit  Subjective:  Patient ID: Darrell Terry, male    DOB: [birth year]  Age: 57 y.o. MRN: [id number]  CC:  Chief Complaint  Patient presents with   Hypertension    Four month follow up    Diabetes    Four month follow up     HPI Darrell Terry is a 57 y.o. male with past medical history of HTN, DM, RA and GERD who presents for f/u of his chronic medical conditions.  GERD and nausea: His nausea has improved now. He takes Omeprazole QOD.  HTN: BP is well-controlled. Takes medications regularly. Patient denies headache, dizziness, chest pain, dyspnea or palpitations.  Type 2 DM: His HbA1C is 6.5 in 11/24.  He is taking metformin 500 mg once daily now. Denies any polyuria or polydipsia.  RA: He is on Plaquenil only now. His joint pain and swelling has been the same without Humira.  MDD and GAD: He reports anxiety and feeling low most of the time.  He lives alone and does not have local family or friends.  He had increased smoking due to feeling anxious, but has cut down back to 1 pack/day now.  He also reports insomnia and lack of appetite.  He agrees to take Wellbutrin again, as he has stopped taking it after 1 month.   Past Medical History:  Diagnosis Date   Depression    Diabetes mellitus without complication (HCC)    Rheumatoid arthritis (HCC)     Past Surgical History:  Procedure Laterality Date   CERVICAL SPINE SURGERY     X 3   COLONOSCOPY N/A 03/26/2015   Procedure: COLONOSCOPY;  Surgeon: Corbin Ade, MD;  Location: [facility];  Service: Endoscopy;  Laterality: N/A;  8:00 AM   TONSILLECTOMY      Family History  Problem Relation Age of Onset   Stroke Other        undocumented which relative    High Cholesterol Other        undocumented which relative    Heart disease Other        undocumented which relative    Cancer - Other Other        undocumented which relative     Social History   Socioeconomic History    Marital status: Divorced    Spouse name: Not on file   Number of children: Not on file   Years of education: Not on file   Highest education level: GED or equivalent  Occupational History   Not on file  Tobacco Use   Smoking status: Some Days    Current packs/day: 1.00    Average packs/day: 1 pack/day for 34.0 years (34.0 ttl pk-yrs)    Types: Cigarettes   Smokeless tobacco: Never  Vaping Use   Vaping status: Never Used  Substance and Sexual Activity   Alcohol use: No    Alcohol/week: 0.0 standard drinks of alcohol   Drug use: Yes    Types: Marijuana    Comment: has not used in couple months    Sexual activity: Not on file  Other Topics Concern   Not on file  Social History Narrative   ** Merged History Encounter **       Social Determinants of Health   Financial Resource Strain: Low Risk  (06/16/2023)   Overall Financial Resource Strain (CARDIA)    Difficulty of Paying Living Expenses: Not very hard  Food Insecurity: No Food Insecurity (06/16/2023)  Hunger Vital Sign    Worried About Running Out of Food in the Last Year: Never true    Ran Out of Food in the Last Year: Never true  Transportation Needs: No Transportation Needs (06/16/2023)   PRAPARE - Administrator, Civil Service (Medical): No    Lack of Transportation (Non-Medical): No  Physical Activity: Insufficiently Active (06/16/2023)   Exercise Vital Sign    Days of Exercise per Week: 1 day    Minutes of Exercise per Session: 30 min  Stress: No Stress Concern Present (06/16/2023)   [employer] of Occupational Health - Occupational Stress Questionnaire    Feeling of Stress : Only a little  Social Connections: Moderately Isolated (06/16/2023)   Social Connection and Isolation Panel [NHANES]    Frequency of Communication with Friends and Family: More than three times a week    Frequency of Social Gatherings with Friends and Family: Three times a week    Attends Religious Services: 1 to 4 times  per year    Active Member of Clubs or Organizations: No    Attends Banker Meetings: Not on file    Marital Status: Divorced  Intimate Partner Violence: Not At Risk (04/19/2021)   Humiliation, Afraid, Rape, and Kick questionnaire    Fear of Current or Ex-Partner: No    Emotionally Abused: No    Physically Abused: No    Sexually Abused: No    Outpatient Medications Prior to Visit  Medication Sig Dispense Refill   albuterol (VENTOLIN HFA) 108 (90 Base) MCG/ACT inhaler Inhale 2 puffs into the lungs every 6 (six) hours as needed for wheezing or shortness of breath. 8 g 5   atorvastatin (LIPITOR) 40 MG tablet Take 1 tablet (40 mg total) by mouth daily. 90 tablet 3   benzonatate (TESSALON) 100 MG capsule Take 1 capsule (100 mg total) by mouth 3 (three) times daily as needed for cough. 30 capsule 0   Cholecalciferol 25 MCG (1000 UT) capsule Take by mouth.     hydroxychloroquine (PLAQUENIL) 200 MG tablet Take 200 mg by mouth 2 (two) times daily.     Omega-3 Fatty Acids (FISH OIL) 1000 MG CAPS Take 5,000 mg by mouth daily.      predniSONE (DELTASONE) 20 MG tablet Take 2 tablets (40 mg total) by mouth daily with breakfast. 10 tablet 0   buPROPion (WELLBUTRIN XL) 150 MG 24 hr tablet Take 1 tablet (150 mg total) by mouth daily. 30 tablet 3   lisinopril-hydrochlorothiazide (ZESTORETIC) 10-12.5 MG tablet Take 1 tablet by mouth daily. 90 tablet 1   metFORMIN (GLUCOPHAGE) 500 MG tablet TAKE 1 TABLET BY MOUTH ONCE DAILY. 90 tablet 0   omeprazole (PRILOSEC) 40 MG capsule Take 1 capsule (40 mg total) by mouth daily as needed (Acid reflux). 90 capsule 1   No facility-administered medications prior to visit.    No Known Allergies  ROS Review of Systems  Constitutional:  Negative for chills and fever.  HENT:  Negative for congestion and sore throat.   Eyes:  Negative for pain and discharge.  Respiratory:  Negative for cough and shortness of breath.   Cardiovascular:  Negative for chest  pain, palpitations and leg swelling.  Gastrointestinal:  Negative for constipation, diarrhea and vomiting.  Endocrine: Negative for polydipsia and polyuria.  Genitourinary:  Negative for dysuria and hematuria.  Musculoskeletal:  Positive for arthralgias, back pain and neck pain. Negative for neck stiffness.  Skin:  Negative for rash.  Neurological:  Negative for dizziness, weakness, numbness and headaches.  Psychiatric/Behavioral:  Positive for dysphoric mood and sleep disturbance. Negative for agitation, behavioral problems and suicidal ideas. The patient is nervous/anxious.       Objective:    Physical Exam Vitals reviewed.  Constitutional:      General: He is not in acute distress.    Appearance: He is not diaphoretic.  HENT:     Head: Normocephalic and atraumatic.     Nose: Nose normal.     Mouth/Throat:     Mouth: Mucous membranes are moist.  Eyes:     General: No scleral icterus.    Extraocular Movements: Extraocular movements intact.  Cardiovascular:     Rate and Rhythm: Normal rate and regular rhythm.     Pulses: Normal pulses.     Heart sounds: Normal heart sounds. No murmur heard.    No friction rub. No gallop.  Pulmonary:     Breath sounds: Normal breath sounds. No wheezing or rales.  Abdominal:     Palpations: Abdomen is soft.     Tenderness: There is no abdominal tenderness.     Hernia: A hernia (Umbilical) is present.  Musculoskeletal:        General: No swelling. Normal range of motion.     Cervical back: Neck supple. No tenderness.     Right lower leg: No edema.     Left lower leg: No edema.  Skin:    General: Skin is warm.     Findings: No rash.  Neurological:     General: No focal deficit present.     Mental Status: He is alert and oriented to person, place, and time.     Sensory: No sensory deficit.     Motor: No weakness.  Psychiatric:        Mood and Affect: Mood normal.        Behavior: Behavior normal.     BP 122/79 (BP Location: Right  Arm, Patient Position: Sitting, Cuff Size: Large)   Pulse 92   Ht 6' (1.829 m)   Wt 201 lb 6.4 oz (91.4 kg)   SpO2 96%   BMI 27.31 kg/m  Wt Readings from Last 3 Encounters:  06/19/23 201 lb 6.4 oz (91.4 kg)  02/16/23 198 lb 9.6 oz (90.1 kg)  10/09/22 199 lb 6.4 oz (90.4 kg)    Lab Results  Component Value Date   TSH 2.010 12/10/2020   Lab Results  Component Value Date   WBC 7.2 10/09/2022   HGB 15.5 10/09/2022   HCT 46.4 10/09/2022   MCV 82 10/09/2022   PLT 187 10/09/2022   Lab Results  Component Value Date   NA 140 06/16/2023   K 5.1 06/16/2023   CO2 25 06/16/2023   GLUCOSE 127 (H) 06/16/2023   BUN 15 06/16/2023   CREATININE 1.36 (H) 06/16/2023   BILITOT 0.3 02/16/2023   ALKPHOS 111 02/16/2023   AST 20 02/16/2023   ALT 14 02/16/2023   PROT 7.5 02/16/2023   ALBUMIN 4.6 02/16/2023   CALCIUM 9.8 06/16/2023   EGFR 60 06/16/2023   Lab Results  Component Value Date   CHOL 136 10/09/2022   Lab Results  Component Value Date   HDL 35 (L) 10/09/2022   Lab Results  Component Value Date   LDLCALC 63 10/09/2022   Lab Results  Component Value Date   TRIG 234 (H) 10/09/2022   Lab Results  Component Value Date   CHOLHDL 3.9 10/09/2022   Lab Results  Component Value Date   HGBA1C 6.5 (H) 06/16/2023      Assessment & Plan:   Problem List Items Addressed This Visit       Cardiovascular and Mediastinum   Hypertension - Primary    BP Readings from Last 1 Encounters:  06/19/23 122/79   Well-controlled with Lisinopril-HCTZ now Counseled for compliance with the medications Advised DASH diet and moderate exercise/walking, at least 150 mins/week      Relevant Medications   lisinopril-hydrochlorothiazide (ZESTORETIC) 10-12.5 MG tablet     Respiratory   Pulmonary emphysema (HCC)    Noted on CT chest Mild dyspnea with exertion, has Albuterol PRN Trying to cut down smoking        Digestive   GERD (gastroesophageal reflux disease)    On  Omeprazole Nausea improved now      Relevant Medications   omeprazole (PRILOSEC) 40 MG capsule     Endocrine   DM (diabetes mellitus), type 2, with other specified complication    Lab Results  Component Value Date   HGBA1C 6.5 (H) 06/16/2023   Overall well-controlled Associated with HTN, HLD and GERD On Metformin 500 mg QD Advised to follow diabetic diet On statin and ACEi Diabetic eye exam: Advised to follow up with Ophthalmology for diabetic eye exam      Relevant Medications   lisinopril-hydrochlorothiazide (ZESTORETIC) 10-12.5 MG tablet   metFORMIN (GLUCOPHAGE) 500 MG tablet     Musculoskeletal and Integument   Rheumatoid arthritis involving multiple sites with positive rheumatoid factor (HCC)    On Plaquenil Follows up with Rheumatologist Advised eye exam as he takes Plaquenil        Other   Tobacco abuse    Smokes about 1 pack/day. Recently cut down from 2 pack/day.  Asked about quitting: confirms that he currently smokes cigarettes Advise to quit smoking: Educated about QUITTING to reduce the risk of cancer, cardio and cerebrovascular disease. Assess willingness: Unwilling to quit at this time, but is working on cutting back. Assist with counseling and pharmacotherapy: Counseled for 5 minutes and literature provided. Did not like Chantix in the past. Agrees to start Wellbutrin 150 mg once daily again. Arrange for follow up: follow up in 4 months and continue to offer help.      Relevant Medications   buPROPion (WELLBUTRIN XL) 150 MG 24 hr tablet   Moderate episode of recurrent major depressive disorder (HCC)    Had started Wellbutrin 150 mg QD for MDD and smoking cessation,  but did not continue - agrees to restart Wellbutrin Denies BH therapy referral Does not have family or friends in the community, but does not want help No SI or HI      Relevant Medications   buPROPion (WELLBUTRIN XL) 150 MG 24 hr tablet   Hyperkalemia    CMP in 07/24 showed K -  5.4, now better at 5.1 Advised to cut down potassium rich food      Other Visit Diagnoses     Encounter for immunization       Relevant Orders   Flu vaccine trivalent PF, 6mos and older(Flulaval,Afluria,Fluarix,Fluzone) (Completed)        Meds ordered this encounter  Medications   buPROPion (WELLBUTRIN XL) 150 MG 24 hr tablet    Sig: Take 1 tablet (150 mg total) by mouth daily.    Dispense:  30 tablet    Refill:  3   lisinopril-hydrochlorothiazide (ZESTORETIC) 10-12.5 MG tablet    Sig: Take 1 tablet by mouth daily.  Dispense:  90 tablet    Refill:  1   metFORMIN (GLUCOPHAGE) 500 MG tablet    Sig: Take 1 tablet (500 mg total) by mouth daily.    Dispense:  90 tablet    Refill:  1   omeprazole (PRILOSEC) 40 MG capsule    Sig: Take 1 capsule (40 mg total) by mouth daily as needed (Acid reflux).    Dispense:  90 capsule    Refill:  1    Follow-up: Return in about 4 months (around 10/17/2023) for DM and HTN.    Anabel Halon, MD

## 2023-06-19 NOTE — Patient Instructions (Signed)
Schedule your Medicare Annual Wellness Visit at checkout.  Please continue to take medications as prescribed.  Please continue to follow low carb diet and perform moderate exercise/walking at least 150 mins/week.

## 2023-06-19 NOTE — Assessment & Plan Note (Addendum)
Smokes about 1 pack/day. Recently cut down from 2 pack/day.  Asked about quitting: confirms that he currently smokes cigarettes Advise to quit smoking: Educated about QUITTING to reduce the risk of cancer, cardio and cerebrovascular disease. Assess willingness: Unwilling to quit at this time, but is working on cutting back. Assist with counseling and pharmacotherapy: Counseled for 5 minutes and literature provided. Did not like Chantix in the past. Agrees to start Wellbutrin 150 mg once daily again. Arrange for follow up: follow up in 4 months and continue to offer help.

## 2023-07-09 ENCOUNTER — Encounter: Payer: Self-pay | Admitting: Internal Medicine

## 2023-07-13 ENCOUNTER — Telehealth: Payer: Medicare Other | Admitting: Physician Assistant

## 2023-07-13 DIAGNOSIS — B9689 Other specified bacterial agents as the cause of diseases classified elsewhere: Secondary | ICD-10-CM | POA: Diagnosis not present

## 2023-07-13 DIAGNOSIS — J069 Acute upper respiratory infection, unspecified: Secondary | ICD-10-CM

## 2023-07-13 MED ORDER — AMOXICILLIN-POT CLAVULANATE 875-125 MG PO TABS
1.0000 | ORAL_TABLET | Freq: Two times a day (BID) | ORAL | 0 refills | Status: DC
Start: 2023-07-13 — End: 2023-09-30

## 2023-07-13 NOTE — Progress Notes (Signed)
E-Visit for Sinus Problems  We are sorry that you are not feeling well.  Here is how we plan to help!  Based on what you have shared with me it looks like you have sinusitis.  Sinusitis is inflammation and infection in the sinus cavities of the head.  Based on your presentation I believe you most likely have Acute Bacterial Sinusitis.  This is an infection caused by bacteria and is treated with antibiotics. I have prescribed  Augmentin 875mg/125mg one tablet twice daily with food, for 10 days.You may use an oral decongestant such as Mucinex D or if you have glaucoma or high blood pressure use plain Mucinex. Saline nasal spray help and can safely be used as often as needed for congestion.  If you develop worsening sinus pain, fever or notice severe headache and vision changes, or if symptoms are not better after completion of antibiotic, please schedule an appointment with a health care provider.    Sinus infections are not as easily transmitted as other respiratory infection, however we still recommend that you avoid close contact with loved ones, especially the very young and elderly.  Remember to wash your hands thoroughly throughout the day as this is the number one way to prevent the spread of infection!  Home Care: Only take medications as instructed by your medical team. Complete the entire course of an antibiotic. Do not take these medications with alcohol. A steam or ultrasonic humidifier can help congestion.  You can place a towel over your head and breathe in the steam from hot water coming from a faucet. Avoid close contacts especially the very young and the elderly. Cover your mouth when you cough or sneeze. Always remember to wash your hands.  Get Help Right Away If: You develop worsening fever or sinus pain. You develop a severe head ache or visual changes. Your symptoms persist after you have completed your treatment plan.  Make sure you Understand these instructions. Will  watch your condition. Will get help right away if you are not doing well or get worse.  Thank you for choosing an e-visit.  Your e-visit answers were reviewed by a board certified advanced clinical practitioner to complete your personal care plan. Depending upon the condition, your plan could have included both over the counter or prescription medications.  Please review your pharmacy choice. Make sure the pharmacy is open so you can pick up prescription now. If there is a problem, you may contact your provider through MyChart messaging and have the prescription routed to another pharmacy.  Your safety is important to us. If you have drug allergies check your prescription carefully.   For the next 24 hours you can use MyChart to ask questions about today's visit, request a non-urgent call back, or ask for a work or school excuse. You will get an email in the next two days asking about your experience. I hope that your e-visit has been valuable and will speed your recovery.  I have spent 5 minutes in review of e-visit questionnaire, review and updating patient chart, medical decision making and response to patient.   Kaelee Pfeffer M Teola Felipe, PA-C  

## 2023-07-27 DIAGNOSIS — M153 Secondary multiple arthritis: Secondary | ICD-10-CM | POA: Diagnosis not present

## 2023-07-27 DIAGNOSIS — Z79899 Other long term (current) drug therapy: Secondary | ICD-10-CM | POA: Diagnosis not present

## 2023-07-27 DIAGNOSIS — M0579 Rheumatoid arthritis with rheumatoid factor of multiple sites without organ or systems involvement: Secondary | ICD-10-CM | POA: Diagnosis not present

## 2023-08-18 DIAGNOSIS — E119 Type 2 diabetes mellitus without complications: Secondary | ICD-10-CM | POA: Insufficient documentation

## 2023-08-19 ENCOUNTER — Other Ambulatory Visit: Payer: Self-pay

## 2023-08-19 DIAGNOSIS — Z122 Encounter for screening for malignant neoplasm of respiratory organs: Secondary | ICD-10-CM

## 2023-08-19 DIAGNOSIS — Z87891 Personal history of nicotine dependence: Secondary | ICD-10-CM

## 2023-09-27 ENCOUNTER — Encounter: Payer: Self-pay | Admitting: Internal Medicine

## 2023-09-28 ENCOUNTER — Ambulatory Visit: Admitting: Internal Medicine

## 2023-09-28 ENCOUNTER — Ambulatory Visit: Payer: Self-pay | Admitting: Internal Medicine

## 2023-09-28 NOTE — Telephone Encounter (Signed)
  Chief Complaint: Leg Sores Symptoms: small bright red sores to bilateral legs. More on right leg than left leg Frequency: for a month Pertinent Negatives: Patient denies CP, SOB, fever Disposition: [] ED /[] Urgent Care (no appt availability in office) / [x] Appointment(In office/virtual)/ []  [facility]/ [] Home Care/ [] Refused Recommended Disposition /[] [facility]/ []  Follow-up with PCP Additional Notes: patient with hx of rheumatoid arthritis on RA medication called with concerns for leg sores. Small bright red sores to bilateral legs. More on right leg than left leg. Patient states they are painful to touch. Has an appointment on 10/20/2023 with PCP but concerned with new sores having popped up. Patient concerned that the legs sores could be connected to his RA. Per protocol, recommendation for an appointment. Appointment made on March 5th, 2025 at 2:00 pm. Patient verbalized understanding of plan and all questions answered.     Copied from CRM [id number]. Topic: Clinical - Medical Advice >> Sep 28, 2023  2:33 PM Tiffany S wrote: Reason for CRM: Patient requesting call back. He said he has developed sores on his leg for past month in half and its spreading. Reason for Disposition  [1] Unexplained sores AND [2] 3 or more  Answer Assessment - Initial Assessment Questions 1. APPEARANCE of SORES: "What do the sores look like?"     Bright red 2. NUMBER: "How many sores are there?"     3 to right leg below the knee. One on left heel 3. SIZE: "How big is the largest sore?"     Biggest one is about the size of quarter 4. LOCATION: "Where are the sores located?"     Right leg below knee, 1 left heel 5. ONSET: "When did the sores begin?"     About 1.5 months ago.  6. TENDER: "Does it hurt when you touch it?"  (Scale 1-10; or mild, moderate, severe)      Yes 7. CAUSE: "What do you think is causing the sores?"     Unsure but patient suspects that the sores could be connected to  his RA 8. OTHER SYMPTOMS: "Do you have any other symptoms?" (e.g., fever, new weakness)     No  Protocols used: Sores-A-AH

## 2023-09-29 NOTE — Telephone Encounter (Signed)
Pt added to waitlist for sooner appt.

## 2023-09-30 ENCOUNTER — Ambulatory Visit (INDEPENDENT_AMBULATORY_CARE_PROVIDER_SITE_OTHER): Payer: Self-pay | Admitting: Internal Medicine

## 2023-09-30 ENCOUNTER — Encounter: Payer: Self-pay | Admitting: Internal Medicine

## 2023-09-30 VITALS — BP 138/87 | HR 87 | Ht 72.0 in | Wt 205.8 lb

## 2023-09-30 DIAGNOSIS — L308 Other specified dermatitis: Secondary | ICD-10-CM

## 2023-09-30 DIAGNOSIS — L400 Psoriasis vulgaris: Secondary | ICD-10-CM

## 2023-09-30 MED ORDER — CLOBETASOL PROPIONATE 0.05 % EX CREA: 1.0000 | TOPICAL_CREAM | Freq: Two times a day (BID) | CUTANEOUS | 1 refills | Status: DC

## 2023-09-30 NOTE — Progress Notes (Signed)
 Acute Office Visit  Subjective:    Patient ID: Darrell Terry, male    DOB: 05/03/1966, 58 y.o.   MRN: [id number]  Chief Complaint  Patient presents with   Sore    Sores on both legs between knees and ankles     HPI Patient is in today for complaint of sore-like skin rashes over b/l legs, mostly on right leg for the last few weeks.  He reports increase in size of the rashes, which have been erythematous and itching as well.  Denies any recent injury or insect bite.  Of note, he has history of rheumatoid arthritis.  Past Medical History:  Diagnosis Date   Depression    Diabetes mellitus without complication (HCC)    Rheumatoid arthritis (HCC)     Past Surgical History:  Procedure Laterality Date   CERVICAL SPINE SURGERY     X 3   COLONOSCOPY N/A 03/26/2015   Procedure: COLONOSCOPY;  Surgeon: Corbin Ade, MD;  Location: [facility];  Service: Endoscopy;  Laterality: N/A;  8:00 AM   TONSILLECTOMY      Family History  Problem Relation Age of Onset   Stroke Other        undocumented which relative    High Cholesterol Other        undocumented which relative    Heart disease Other        undocumented which relative    Cancer - Other Other        undocumented which relative     Social History   Socioeconomic History   Marital status: Divorced    Spouse name: Not on file   Number of children: Not on file   Years of education: Not on file   Highest education level: GED or equivalent  Occupational History   Not on file  Tobacco Use   Smoking status: Some Days    Current packs/day: 1.00    Average packs/day: 1 pack/day for 34.0 years (34.0 ttl pk-yrs)    Types: Cigarettes   Smokeless tobacco: Never  Vaping Use   Vaping status: Never Used  Substance and Sexual Activity   Alcohol use: No    Alcohol/week: 0.0 standard drinks of alcohol   Drug use: Yes    Types: Marijuana    Comment: has not used in couple months    Sexual activity: Not on file  Other  Topics Concern   Not on file  Social History Narrative   ** Merged History Encounter **       Social Drivers of Health   Financial Resource Strain: Low Risk  (09/29/2023)   Overall Financial Resource Strain (CARDIA)    Difficulty of Paying Living Expenses: Not very hard  Food Insecurity: No Food Insecurity (09/29/2023)   Hunger Vital Sign    Worried About Running Out of Food in the Last Year: Never true    Ran Out of Food in the Last Year: Never true  Transportation Needs: No Transportation Needs (09/29/2023)   PRAPARE - Administrator, Civil Service (Medical): No    Lack of Transportation (Non-Medical): No  Physical Activity: Insufficiently Active (09/29/2023)   Exercise Vital Sign    Days of Exercise per Week: 1 day    Minutes of Exercise per Session: 10 min  Stress: Stress Concern Present (09/29/2023)   [employer] of Occupational Health - Occupational Stress Questionnaire    Feeling of Stress : To some extent  Social Connections: Moderately Isolated (  09/29/2023)   Social Connection and Isolation Panel [NHANES]    Frequency of Communication with Friends and Family: Twice a week    Frequency of Social Gatherings with Friends and Family: Once a week    Attends Religious Services: 1 to 4 times per year    Active Member of [employer] or Organizations: No    Attends Engineer, structural: Not on file    Marital Status: Divorced  Intimate Partner Violence: Not At Risk (04/19/2021)   Humiliation, Afraid, Rape, and Kick questionnaire    Fear of Current or Ex-Partner: No    Emotionally Abused: No    Physically Abused: No    Sexually Abused: No    Outpatient Medications Prior to Visit  Medication Sig Dispense Refill   albuterol (VENTOLIN HFA) 108 (90 Base) MCG/ACT inhaler Inhale 2 puffs into the lungs every 6 (six) hours as needed for wheezing or shortness of breath. 8 g 5   atorvastatin (LIPITOR) 40 MG tablet Take 1 tablet (40 mg total) by mouth daily. 90 tablet 3    benzonatate (TESSALON) 100 MG capsule Take 1 capsule (100 mg total) by mouth 3 (three) times daily as needed for cough. 30 capsule 0   buPROPion (WELLBUTRIN XL) 150 MG 24 hr tablet Take 1 tablet (150 mg total) by mouth daily. 30 tablet 3   Cholecalciferol 25 MCG (1000 UT) capsule Take by mouth.     hydroxychloroquine (PLAQUENIL) 200 MG tablet Take 200 mg by mouth 2 (two) times daily.     lisinopril-hydrochlorothiazide (ZESTORETIC) 10-12.5 MG tablet Take 1 tablet by mouth daily. 90 tablet 1   metFORMIN (GLUCOPHAGE) 500 MG tablet Take 1 tablet (500 mg total) by mouth daily. 90 tablet 1   Omega-3 Fatty Acids (FISH OIL) 1000 MG CAPS Take 5,000 mg by mouth daily.      omeprazole (PRILOSEC) 40 MG capsule Take 1 capsule (40 mg total) by mouth daily as needed (Acid reflux). 90 capsule 1   predniSONE (DELTASONE) 20 MG tablet Take 2 tablets (40 mg total) by mouth daily with breakfast. 10 tablet 0   amoxicillin-clavulanate (AUGMENTIN) 875-125 MG tablet Take 1 tablet by mouth 2 (two) times daily. 20 tablet 0   No facility-administered medications prior to visit.    No Known Allergies  Review of Systems  Constitutional:  Negative for chills and fever.  HENT:  Negative for congestion and sore throat.   Eyes:  Negative for pain and discharge.  Respiratory:  Negative for cough and shortness of breath.   Cardiovascular:  Negative for chest pain, palpitations and leg swelling.  Gastrointestinal:  Negative for constipation, diarrhea and vomiting.  Endocrine: Negative for polydipsia and polyuria.  Genitourinary:  Negative for dysuria and hematuria.  Musculoskeletal:  Positive for arthralgias, back pain and neck pain. Negative for neck stiffness.  Skin:  Positive for rash.  Neurological:  Negative for dizziness, weakness, numbness and headaches.  Psychiatric/Behavioral:  Positive for dysphoric mood and sleep disturbance. Negative for agitation, behavioral problems and suicidal ideas. The patient is  nervous/anxious.        Objective:    Physical Exam Vitals reviewed.  Constitutional:      General: He is not in acute distress.    Appearance: He is not diaphoretic.  HENT:     Head: Normocephalic and atraumatic.     Nose: Nose normal.     Mouth/Throat:     Mouth: Mucous membranes are moist.  Eyes:     General: No scleral icterus.  Extraocular Movements: Extraocular movements intact.  Cardiovascular:     Rate and Rhythm: Normal rate and regular rhythm.     Pulses: Normal pulses.     Heart sounds: Normal heart sounds. No murmur heard. Pulmonary:     Breath sounds: Normal breath sounds. No wheezing or rales.  Abdominal:     Palpations: Abdomen is soft.     Tenderness: There is no abdominal tenderness.     Hernia: A hernia (Umbilical) is present.  Musculoskeletal:        General: No swelling. Normal range of motion.     Cervical back: Neck supple. No tenderness.     Right lower leg: No edema.     Left lower leg: No edema.  Skin:    General: Skin is warm.     Findings: Rash (Erythematous rash with silvery plaques over right leg) present.  Neurological:     General: No focal deficit present.     Mental Status: He is alert and oriented to person, place, and time.     Sensory: No sensory deficit.     Motor: No weakness.  Psychiatric:        Mood and Affect: Mood normal.        Behavior: Behavior normal.        BP 138/87 (BP Location: Left Arm, Patient Position: Sitting, Cuff Size: Normal)   Pulse 87   Ht 6' (1.829 m)   Wt 205 lb 12.8 oz (93.4 kg)   SpO2 96%   BMI 27.91 kg/m  Wt Readings from Last 3 Encounters:  09/30/23 205 lb 12.8 oz (93.4 kg)  06/19/23 201 lb 6.4 oz (91.4 kg)  02/16/23 198 lb 9.6 oz (90.1 kg)        Assessment & Plan:   Problem List Items Addressed This Visit       Musculoskeletal and Integument   Psoriasiform eruption - Primary   Rash appears to be psoriasiform, concerning due to h/o RA Prescribed clobetasol cream Referred to  dermatology      Relevant Medications   clobetasol cream (TEMOVATE) 0.05 %   Other Relevant Orders   Ambulatory referral to Dermatology     Meds ordered this encounter  Medications   clobetasol cream (TEMOVATE) 0.05 %    Sig: Apply 1 Application topically 2 (two) times daily.    Dispense:  60 g    Refill:  1     Zinia Innocent Concha Se, MD

## 2023-09-30 NOTE — Patient Instructions (Signed)
 Please start applying Clobetasol cream over the rash/plaque area.  You are being referred to Dermatology.

## 2023-10-02 DIAGNOSIS — L308 Other specified dermatitis: Secondary | ICD-10-CM | POA: Insufficient documentation

## 2023-10-02 NOTE — Assessment & Plan Note (Signed)
 Rash appears to be psoriasiform, concerning due to h/o RA Prescribed clobetasol cream Referred to dermatology

## 2023-10-20 ENCOUNTER — Ambulatory Visit: Payer: Medicare Other | Admitting: Internal Medicine

## 2023-10-20 ENCOUNTER — Ambulatory Visit (INDEPENDENT_AMBULATORY_CARE_PROVIDER_SITE_OTHER): Admitting: Internal Medicine

## 2023-10-20 ENCOUNTER — Encounter: Payer: Self-pay | Admitting: Internal Medicine

## 2023-10-20 ENCOUNTER — Ambulatory Visit (HOSPITAL_COMMUNITY)
Admission: RE | Admit: 2023-10-20 | Discharge: 2023-10-20 | Disposition: A | Discharge status: Home / Self Care | Payer: Medicare Other | Source: Ambulatory Visit | Attending: Oncology | Admitting: Oncology

## 2023-10-20 VITALS — BP 135/85 | HR 94 | Ht 72.0 in | Wt 201.8 lb

## 2023-10-20 DIAGNOSIS — E559 Vitamin D deficiency, unspecified: Secondary | ICD-10-CM | POA: Diagnosis not present

## 2023-10-20 DIAGNOSIS — Z122 Encounter for screening for malignant neoplasm of respiratory organs: Secondary | ICD-10-CM | POA: Diagnosis not present

## 2023-10-20 DIAGNOSIS — F331 Major depressive disorder, recurrent, moderate: Secondary | ICD-10-CM | POA: Diagnosis not present

## 2023-10-20 DIAGNOSIS — J439 Emphysema, unspecified: Secondary | ICD-10-CM

## 2023-10-20 DIAGNOSIS — Z23 Encounter for immunization: Secondary | ICD-10-CM

## 2023-10-20 DIAGNOSIS — Z0001 Encounter for general adult medical examination with abnormal findings: Secondary | ICD-10-CM | POA: Diagnosis not present

## 2023-10-20 DIAGNOSIS — N529 Male erectile dysfunction, unspecified: Secondary | ICD-10-CM | POA: Diagnosis not present

## 2023-10-20 DIAGNOSIS — I10 Essential (primary) hypertension: Secondary | ICD-10-CM

## 2023-10-20 DIAGNOSIS — Z125 Encounter for screening for malignant neoplasm of prostate: Secondary | ICD-10-CM | POA: Diagnosis not present

## 2023-10-20 DIAGNOSIS — E782 Mixed hyperlipidemia: Secondary | ICD-10-CM | POA: Diagnosis not present

## 2023-10-20 DIAGNOSIS — M0579 Rheumatoid arthritis with rheumatoid factor of multiple sites without organ or systems involvement: Secondary | ICD-10-CM

## 2023-10-20 DIAGNOSIS — L308 Other specified dermatitis: Secondary | ICD-10-CM

## 2023-10-20 DIAGNOSIS — E1169 Type 2 diabetes mellitus with other specified complication: Secondary | ICD-10-CM | POA: Diagnosis not present

## 2023-10-20 DIAGNOSIS — Z87891 Personal history of nicotine dependence: Secondary | ICD-10-CM | POA: Diagnosis not present

## 2023-10-20 DIAGNOSIS — F17210 Nicotine dependence, cigarettes, uncomplicated: Secondary | ICD-10-CM | POA: Diagnosis not present

## 2023-10-20 MED ORDER — SILDENAFIL CITRATE 50 MG PO TABS: 50.0000 mg | ORAL_TABLET | Freq: Every day | ORAL | 0 refills | Status: AC | PRN

## 2023-10-20 NOTE — Assessment & Plan Note (Signed)
Physical exam as documented. Fasting blood tests today. Advised to get Shingrix and Tdap vaccines at local pharmacy. 

## 2023-10-20 NOTE — Assessment & Plan Note (Signed)
 BP Readings from Last 1 Encounters:  10/20/23 135/85   Well-controlled with Lisinopril-HCTZ now Counseled for compliance with the medications Advised DASH diet and moderate exercise/walking, at least 150 mins/week

## 2023-10-20 NOTE — Assessment & Plan Note (Signed)
 Ordered PSA after discussing its limitations for prostate cancer screening, including false positive results leading to additional investigations.

## 2023-10-20 NOTE — Assessment & Plan Note (Signed)
 Lab Results  Component Value Date   HGBA1C 6.5 (H) 06/16/2023   Overall well-controlled Associated with HTN, HLD and GERD On Metformin 500 mg QD Advised to follow diabetic diet On statin and ACEi Diabetic eye exam: Advised to follow up with Ophthalmology for diabetic eye exam

## 2023-10-20 NOTE — Assessment & Plan Note (Signed)
 On Plaquenil Follows up with Rheumatologist Advised eye exam as he takes Plaquenil

## 2023-10-20 NOTE — Assessment & Plan Note (Signed)
 Started Sildenafil 50 mg PRN

## 2023-10-20 NOTE — Assessment & Plan Note (Signed)
 Noted on CT chest ?Mild dyspnea with exertion, has Albuterol PRN ?Trying to cut down smoking ?

## 2023-10-20 NOTE — Addendum Note (Signed)
 Addended byTrena Platt on: 10/20/2023 01:10 PM   Modules accepted: Level of Service

## 2023-10-20 NOTE — Patient Instructions (Signed)
 Please continue to take medications as prescribed.  Please continue to follow low carb diet and perform moderate exercise/walking at least 150 mins/week.

## 2023-10-20 NOTE — Progress Notes (Signed)
 Established Patient Office Visit  Subjective:  Patient ID: Darrell Terry, male    DOB: 1966-07-14  Age: 58 y.o. MRN: [id number]  CC:  Chief Complaint  Patient presents with   Annual Exam    HPI Darrell Terry is a 58 y.o. male with past medical history of HTN, DM, RA and GERD who presents for annual physical.  GERD and nausea: His nausea has improved now. He takes Omeprazole QOD.  HTN: BP is well-controlled. Takes medications regularly. Patient denies headache, dizziness, chest pain, dyspnea or palpitations.  Type 2 DM: His HbA1C is 6.5 in 11/24.  He is taking metformin 500 mg once daily now. Denies any polyuria or polydipsia.  RA: He is on Plaquenil only now. His joint pain and swelling has been the same without Humira.  MDD and GAD: He reports anxiety and feeling low at times now.  He lives alone and does not have local family or friends.  He had increased smoking due to feeling anxious, but has cut down back to less than 1 pack/day now.  He also reports insomnia and lack of appetite.  He tried Wellbutrin again, but it was not helping him and he has stopped taking it after 1 month. He attributes his symptoms to his age and inability to do activities as he used to do because of RA. He prefers to avoid any antidepressant for now.  He reports difficulty maintaining erection.  Denies dysuria, hematuria, urethral discharge currently.  Past Medical History:  Diagnosis Date   Depression    Diabetes mellitus without complication (HCC)    Rheumatoid arthritis (HCC)     Past Surgical History:  Procedure Laterality Date   CERVICAL SPINE SURGERY     X 3   COLONOSCOPY N/A 03/26/2015   Procedure: COLONOSCOPY;  Surgeon: Corbin Ade, MD;  Location: [facility];  Service: Endoscopy;  Laterality: N/A;  8:00 AM   TONSILLECTOMY      Family History  Problem Relation Age of Onset   Stroke Other        undocumented which relative    High Cholesterol Other        undocumented  which relative    Heart disease Other        undocumented which relative    Cancer - Other Other        undocumented which relative     Social History   Socioeconomic History   Marital status: Divorced    Spouse name: Not on file   Number of children: Not on file   Years of education: Not on file   Highest education level: GED or equivalent  Occupational History   Not on file  Tobacco Use   Smoking status: Some Days    Current packs/day: 1.00    Average packs/day: 1 pack/day for 34.0 years (34.0 ttl pk-yrs)    Types: Cigarettes   Smokeless tobacco: Never  Vaping Use   Vaping status: Never Used  Substance and Sexual Activity   Alcohol use: No    Alcohol/week: 0.0 standard drinks of alcohol   Drug use: Yes    Types: Marijuana    Comment: has not used in couple months    Sexual activity: Not on file  Other Topics Concern   Not on file  Social History Narrative   ** Merged History Encounter **       Social Drivers of Health   Financial Resource Strain: Low Risk  (09/29/2023)   Overall  Financial Resource Strain (CARDIA)    Difficulty of Paying Living Expenses: Not very hard  Food Insecurity: No Food Insecurity (09/29/2023)   Hunger Vital Sign    Worried About Running Out of Food in the Last Year: Never true    Ran Out of Food in the Last Year: Never true  Transportation Needs: No Transportation Needs (09/29/2023)   PRAPARE - Administrator, Civil Service (Medical): No    Lack of Transportation (Non-Medical): No  Physical Activity: Insufficiently Active (09/29/2023)   Exercise Vital Sign    Days of Exercise per Week: 1 day    Minutes of Exercise per Session: 10 min  Stress: Stress Concern Present (09/29/2023)   [employer] of Occupational Health - Occupational Stress Questionnaire    Feeling of Stress : To some extent  Social Connections: Moderately Isolated (09/29/2023)   Social Connection and Isolation Panel [NHANES]    Frequency of Communication with  Friends and Family: Twice a week    Frequency of Social Gatherings with Friends and Family: Once a week    Attends Religious Services: 1 to 4 times per year    Active Member of [employer] or Organizations: No    Attends Engineer, structural: Not on file    Marital Status: Divorced  Intimate Partner Violence: Not At Risk (04/19/2021)   Humiliation, Afraid, Rape, and Kick questionnaire    Fear of Current or Ex-Partner: No    Emotionally Abused: No    Physically Abused: No    Sexually Abused: No    Outpatient Medications Prior to Visit  Medication Sig Dispense Refill   albuterol (VENTOLIN HFA) 108 (90 Base) MCG/ACT inhaler Inhale 2 puffs into the lungs every 6 (six) hours as needed for wheezing or shortness of breath. 8 g 5   atorvastatin (LIPITOR) 40 MG tablet Take 1 tablet (40 mg total) by mouth daily. 90 tablet 3   Cholecalciferol 25 MCG (1000 UT) capsule Take by mouth.     clobetasol cream (TEMOVATE) 0.05 % Apply 1 Application topically 2 (two) times daily. 60 g 1   hydroxychloroquine (PLAQUENIL) 200 MG tablet Take 200 mg by mouth 2 (two) times daily.     lisinopril-hydrochlorothiazide (ZESTORETIC) 10-12.5 MG tablet Take 1 tablet by mouth daily. 90 tablet 1   metFORMIN (GLUCOPHAGE) 500 MG tablet Take 1 tablet (500 mg total) by mouth daily. 90 tablet 1   Omega-3 Fatty Acids (FISH OIL) 1000 MG CAPS Take 5,000 mg by mouth daily.      omeprazole (PRILOSEC) 40 MG capsule Take 1 capsule (40 mg total) by mouth daily as needed (Acid reflux). 90 capsule 1   benzonatate (TESSALON) 100 MG capsule Take 1 capsule (100 mg total) by mouth 3 (three) times daily as needed for cough. 30 capsule 0   buPROPion (WELLBUTRIN XL) 150 MG 24 hr tablet Take 1 tablet (150 mg total) by mouth daily. 30 tablet 3   predniSONE (DELTASONE) 20 MG tablet Take 2 tablets (40 mg total) by mouth daily with breakfast. 10 tablet 0   No facility-administered medications prior to visit.    No Known Allergies  ROS Review  of Systems  Constitutional:  Negative for chills and fever.  HENT:  Negative for congestion and sore throat.   Eyes:  Negative for pain and discharge.  Respiratory:  Negative for cough and shortness of breath.   Cardiovascular:  Negative for chest pain, palpitations and leg swelling.  Gastrointestinal:  Negative for constipation, diarrhea and vomiting.  Endocrine: Negative for polydipsia and polyuria.  Genitourinary:  Negative for dysuria and hematuria.  Musculoskeletal:  Positive for arthralgias, back pain and neck pain. Negative for neck stiffness.  Skin:  Positive for rash.  Neurological:  Negative for dizziness, weakness, numbness and headaches.  Psychiatric/Behavioral:  Positive for dysphoric mood and sleep disturbance. Negative for agitation, behavioral problems and suicidal ideas. The patient is nervous/anxious.       Objective:    Physical Exam Vitals reviewed.  Constitutional:      General: He is not in acute distress.    Appearance: He is not diaphoretic.  HENT:     Head: Normocephalic and atraumatic.     Nose: Nose normal.     Mouth/Throat:     Mouth: Mucous membranes are moist.  Eyes:     General: No scleral icterus.    Extraocular Movements: Extraocular movements intact.  Cardiovascular:     Rate and Rhythm: Normal rate and regular rhythm.     Pulses: Normal pulses.     Heart sounds: Normal heart sounds. No murmur heard.    No friction rub. No gallop.  Pulmonary:     Breath sounds: Normal breath sounds. No wheezing or rales.  Abdominal:     Palpations: Abdomen is soft.     Tenderness: There is no abdominal tenderness.     Hernia: A hernia (Umbilical) is present.  Musculoskeletal:        General: No swelling. Normal range of motion.     Cervical back: Neck supple. No tenderness.     Right lower leg: No edema.     Left lower leg: No edema.  Skin:    General: Skin is warm.     Findings: Rash (Erythematous rash with silvery plaques over right leg) present.   Neurological:     General: No focal deficit present.     Mental Status: He is alert and oriented to person, place, and time.     Sensory: No sensory deficit.     Motor: No weakness.  Psychiatric:        Mood and Affect: Mood normal.        Behavior: Behavior normal.     BP 135/85   Pulse 94   Ht 6' (1.829 m)   Wt 201 lb 12.8 oz (91.5 kg)   SpO2 96%   BMI 27.37 kg/m  Wt Readings from Last 3 Encounters:  10/20/23 201 lb 12.8 oz (91.5 kg)  09/30/23 205 lb 12.8 oz (93.4 kg)  06/19/23 201 lb 6.4 oz (91.4 kg)    Lab Results  Component Value Date   TSH 2.010 12/10/2020   Lab Results  Component Value Date   WBC 7.2 10/09/2022   HGB 15.5 10/09/2022   HCT 46.4 10/09/2022   MCV 82 10/09/2022   PLT 187 10/09/2022   Lab Results  Component Value Date   NA 140 06/16/2023   K 5.1 06/16/2023   CO2 25 06/16/2023   GLUCOSE 127 (H) 06/16/2023   BUN 15 06/16/2023   CREATININE 1.36 (H) 06/16/2023   BILITOT 0.3 02/16/2023   ALKPHOS 111 02/16/2023   AST 20 02/16/2023   ALT 14 02/16/2023   PROT 7.5 02/16/2023   ALBUMIN 4.6 02/16/2023   CALCIUM 9.8 06/16/2023   EGFR 60 06/16/2023   Lab Results  Component Value Date   CHOL 136 10/09/2022   Lab Results  Component Value Date   HDL 35 (L) 10/09/2022   Lab Results  Component Value Date  LDLCALC 63 10/09/2022   Lab Results  Component Value Date   TRIG 234 (H) 10/09/2022   Lab Results  Component Value Date   CHOLHDL 3.9 10/09/2022   Lab Results  Component Value Date   HGBA1C 6.5 (H) 06/16/2023      Assessment & Plan:   Problem List Items Addressed This Visit       Cardiovascular and Mediastinum   Hypertension   BP Readings from Last 1 Encounters:  10/20/23 135/85   Well-controlled with Lisinopril-HCTZ now Counseled for compliance with the medications Advised DASH diet and moderate exercise/walking, at least 150 mins/week      Relevant Medications   sildenafil (VIAGRA) 50 MG tablet   Other Relevant  Orders   TSH     Respiratory   Pulmonary emphysema (HCC)   Noted on CT chest Mild dyspnea with exertion, has Albuterol PRN Trying to cut down smoking        Endocrine   DM (diabetes mellitus), type 2, with other specified complication   Lab Results  Component Value Date   HGBA1C 6.5 (H) 06/16/2023   Overall well-controlled Associated with HTN, HLD and GERD On Metformin 500 mg QD Advised to follow diabetic diet On statin and ACEi Diabetic eye exam: Advised to follow up with Ophthalmology for diabetic eye exam      Relevant Orders   Hemoglobin A1c   CMP14+EGFR     Musculoskeletal and Integument   Rheumatoid arthritis involving multiple sites with positive rheumatoid factor (HCC)   On Plaquenil Follows up with Rheumatologist Advised eye exam as he takes Plaquenil      Relevant Orders   CMP14+EGFR   CBC with Differential/Platelet   Psoriasiform eruption   Rash appears to be psoriasiform, concerning due to h/o RA Rash slightly improved with clobetasol cream, continue for now Referred to dermatology        Other   Hyperlipidemia   On statin Reviewed lipid profile      Relevant Medications   sildenafil (VIAGRA) 50 MG tablet   Other Relevant Orders   Lipid panel   ED (erectile dysfunction) of organic origin   Started Sildenafil 50 mg PRN      Relevant Medications   sildenafil (VIAGRA) 50 MG tablet   Moderate episode of recurrent major depressive disorder (HCC)   Currently controlled Had started Wellbutrin 150 mg QD for MDD and smoking cessation,  but did not continue as it was ineffective Denies BH therapy referral Does not have family or friends in the community, but does not want help No SI or HI      Prostate cancer screening   Ordered PSA after discussing its limitations for prostate cancer screening, including false positive results leading to additional investigations.      Relevant Orders   PSA   Encounter for general adult medical  examination with abnormal findings - Primary   Physical exam as documented. Fasting blood tests today. Advised to get Shingrix and Tdap vaccines at local pharmacy.      Other Visit Diagnoses       Vitamin D deficiency       Relevant Orders   VITAMIN D 25 Hydroxy (Vit-D Deficiency, Fractures)     Encounter for immunization       Relevant Orders   Pneumococcal conjugate vaccine 20-valent (Completed)         Meds ordered this encounter  Medications   sildenafil (VIAGRA) 50 MG tablet    Sig: Take 1 tablet (50  mg total) by mouth daily as needed for erectile dysfunction.    Dispense:  30 tablet    Refill:  0    Follow-up: Return in about 4 months (around 02/19/2024) for DM and HTN.    Anabel Halon, MD

## 2023-10-20 NOTE — Assessment & Plan Note (Signed)
On statin Reviewed lipid profile

## 2023-10-20 NOTE — Assessment & Plan Note (Signed)
 Rash appears to be psoriasiform, concerning due to h/o RA Rash slightly improved with clobetasol cream, continue for now Referred to dermatology

## 2023-10-20 NOTE — Assessment & Plan Note (Addendum)
 Currently controlled Had started Wellbutrin 150 mg QD for MDD and smoking cessation,  but did not continue as it was ineffective Denies BH therapy referral Does not have family or friends in the community, but does not want help No SI or HI

## 2023-10-21 LAB — CMP14+EGFR
ALT: 18 IU/L (ref 0–44)
AST: 16 IU/L (ref 0–40)
Albumin: 4.7 g/dL (ref 3.8–4.9)
Alkaline Phosphatase: 116 IU/L (ref 44–121)
BUN/Creatinine Ratio: 8 — ABNORMAL LOW (ref 9–20)
BUN: 10 mg/dL (ref 6–24)
Bilirubin Total: 0.4 mg/dL (ref 0.0–1.2)
CO2: 26 mmol/L (ref 20–29)
Calcium: 9.9 mg/dL (ref 8.7–10.2)
Chloride: 101 mmol/L (ref 96–106)
Creatinine, Ser: 1.18 mg/dL (ref 0.76–1.27)
Globulin, Total: 3.1 g/dL (ref 1.5–4.5)
Glucose: 141 mg/dL — ABNORMAL HIGH (ref 70–99)
Potassium: 4.9 mmol/L (ref 3.5–5.2)
Sodium: 142 mmol/L (ref 134–144)
Total Protein: 7.8 g/dL (ref 6.0–8.5)
eGFR: 71 mL/min/{1.73_m2} (ref >59)

## 2023-10-21 LAB — HEMOGLOBIN A1C
Est. average glucose Bld gHb Est-mCnc: 143 mg/dL
Hgb A1c MFr Bld: 6.6 % — ABNORMAL HIGH (ref 4.8–5.6)

## 2023-10-21 LAB — CBC WITH DIFFERENTIAL/PLATELET
Basophils Absolute: 0.1 10*3/uL (ref 0.0–0.2)
Basos: 1 %
EOS (ABSOLUTE): 0.4 10*3/uL (ref 0.0–0.4)
Eos: 6 %
Hematocrit: 47.2 % (ref 37.5–51.0)
Hemoglobin: 15.6 g/dL (ref 13.0–17.7)
Immature Grans (Abs): 0 10*3/uL (ref 0.0–0.1)
Immature Granulocytes: 0 %
Lymphocytes Absolute: 2.5 10*3/uL (ref 0.7–3.1)
Lymphs: 36 %
MCH: 27.5 pg (ref 26.6–33.0)
MCHC: 33.1 g/dL (ref 31.5–35.7)
MCV: 83 fL (ref 79–97)
Monocytes Absolute: 0.7 10*3/uL (ref 0.1–0.9)
Monocytes: 10 %
Neutrophils Absolute: 3.2 10*3/uL (ref 1.4–7.0)
Neutrophils: 47 %
Platelets: 195 10*3/uL (ref 150–450)
RBC: 5.67 x10E6/uL (ref 4.14–5.80)
RDW: 14.2 % (ref 11.6–15.4)
WBC: 6.9 10*3/uL (ref 3.4–10.8)

## 2023-10-21 LAB — VITAMIN D 25 HYDROXY (VIT D DEFICIENCY, FRACTURES): Vit D, 25-Hydroxy: 31.6 ng/mL (ref 30.0–100.0)

## 2023-10-21 LAB — PSA: Prostate Specific Ag, Serum: 2.9 ng/mL (ref 0.0–4.0)

## 2023-10-21 LAB — LIPID PANEL
Chol/HDL Ratio: 4.1 ratio (ref 0.0–5.0)
Cholesterol, Total: 142 mg/dL (ref 100–199)
HDL: 35 mg/dL — ABNORMAL LOW (ref >39)
LDL Chol Calc (NIH): 67 mg/dL (ref 0–99)
Triglycerides: 248 mg/dL — ABNORMAL HIGH (ref 0–149)
VLDL Cholesterol Cal: 40 mg/dL (ref 5–40)

## 2023-10-21 LAB — TSH: TSH: 1.93 u[IU]/mL (ref 0.450–4.500)

## 2023-11-05 ENCOUNTER — Encounter: Payer: Self-pay | Admitting: Internal Medicine

## 2023-11-16 NOTE — Progress Notes (Signed)
Patient notified of LDCT Lung Cancer Screening Results via mail with the recommendation to follow-up in 12 months. Patient's referring provider has been sent a copy of results. Results are as follows:   IMPRESSION: 1. Lung-RADS 2, benign appearance or behavior. Continue annual screening with low-dose chest CT without contrast in 12 months. 2. Three-vessel coronary atherosclerosis. 3. Aortic Atherosclerosis (ICD10-I70.0) and Emphysema (ICD10-J43.9). 

## 2023-12-14 DIAGNOSIS — Z79899 Other long term (current) drug therapy: Secondary | ICD-10-CM | POA: Diagnosis not present

## 2023-12-14 DIAGNOSIS — M0579 Rheumatoid arthritis with rheumatoid factor of multiple sites without organ or systems involvement: Secondary | ICD-10-CM | POA: Diagnosis not present

## 2023-12-14 DIAGNOSIS — M150 Primary generalized (osteo)arthritis: Secondary | ICD-10-CM | POA: Diagnosis not present

## 2023-12-27 ENCOUNTER — Other Ambulatory Visit: Payer: Self-pay | Admitting: Internal Medicine

## 2023-12-27 DIAGNOSIS — L308 Other specified dermatitis: Secondary | ICD-10-CM

## 2024-01-11 ENCOUNTER — Other Ambulatory Visit: Payer: Self-pay | Admitting: Internal Medicine

## 2024-01-11 DIAGNOSIS — E785 Hyperlipidemia, unspecified: Secondary | ICD-10-CM

## 2024-02-16 DIAGNOSIS — Z79899 Other long term (current) drug therapy: Secondary | ICD-10-CM | POA: Diagnosis not present

## 2024-03-04 ENCOUNTER — Ambulatory Visit (INDEPENDENT_AMBULATORY_CARE_PROVIDER_SITE_OTHER): Admitting: Internal Medicine

## 2024-03-04 ENCOUNTER — Encounter: Payer: Self-pay | Admitting: Internal Medicine

## 2024-03-04 VITALS — BP 117/79 | HR 93 | Ht 72.0 in | Wt 210.8 lb

## 2024-03-04 DIAGNOSIS — J439 Emphysema, unspecified: Secondary | ICD-10-CM

## 2024-03-04 DIAGNOSIS — Z720 Tobacco use: Secondary | ICD-10-CM | POA: Diagnosis not present

## 2024-03-04 DIAGNOSIS — F17210 Nicotine dependence, cigarettes, uncomplicated: Secondary | ICD-10-CM | POA: Diagnosis not present

## 2024-03-04 DIAGNOSIS — I2510 Atherosclerotic heart disease of native coronary artery without angina pectoris: Secondary | ICD-10-CM | POA: Diagnosis not present

## 2024-03-04 DIAGNOSIS — E1169 Type 2 diabetes mellitus with other specified complication: Secondary | ICD-10-CM

## 2024-03-04 DIAGNOSIS — M0579 Rheumatoid arthritis with rheumatoid factor of multiple sites without organ or systems involvement: Secondary | ICD-10-CM | POA: Diagnosis not present

## 2024-03-04 DIAGNOSIS — I10 Essential (primary) hypertension: Secondary | ICD-10-CM

## 2024-03-04 DIAGNOSIS — L308 Other specified dermatitis: Secondary | ICD-10-CM | POA: Diagnosis not present

## 2024-03-04 DIAGNOSIS — F331 Major depressive disorder, recurrent, moderate: Secondary | ICD-10-CM

## 2024-03-04 DIAGNOSIS — E782 Mixed hyperlipidemia: Secondary | ICD-10-CM

## 2024-03-04 DIAGNOSIS — Z7984 Long term (current) use of oral hypoglycemic drugs: Secondary | ICD-10-CM

## 2024-03-04 MED ORDER — METFORMIN HCL 500 MG PO TABS: 500.0000 mg | ORAL_TABLET | Freq: Every day | ORAL | 1 refills | Status: DC

## 2024-03-04 NOTE — Assessment & Plan Note (Signed)
On statin Reviewed lipid profile

## 2024-03-04 NOTE — Assessment & Plan Note (Signed)
 Currently controlled Had started Wellbutrin 150 mg QD for MDD and smoking cessation,  but did not continue as it was ineffective Denies BH therapy referral Does not have family or friends in the community, but does not want help No SI or HI

## 2024-03-04 NOTE — Assessment & Plan Note (Addendum)
 BP Readings from Last 1 Encounters:  03/04/24 117/79   Well-controlled with Lisinopril -HCTZ now Counseled for compliance with the medications Advised DASH diet and moderate exercise/walking, at least 150 mins/week

## 2024-03-04 NOTE — Assessment & Plan Note (Signed)
 On Plaquenil Follows up with Rheumatologist Advised eye exam as he takes Plaquenil

## 2024-03-04 NOTE — Assessment & Plan Note (Signed)
 Noted on CT chest Currently denies chest pain On statin, advised to start Aspirin 81 mg once daily Referred to Cardiology

## 2024-03-04 NOTE — Assessment & Plan Note (Signed)
 Smokes about 1 pack/day. Has cut down from 2 pack/day.  Asked about quitting: confirms that he currently smokes cigarettes Advise to quit smoking: Educated about QUITTING to reduce the risk of cancer, cardio and cerebrovascular disease. Assess willingness: Unwilling to quit at this time, but is working on cutting back. Assist with counseling and pharmacotherapy: Counseled for 5 minutes and literature provided. Did not like Chantix  in the past. Has tried Wellbutrin  as well. Arrange for follow up: follow up in 4 months and continue to offer help.

## 2024-03-04 NOTE — Assessment & Plan Note (Signed)
 Rash appears to be psoriasiform, concerning due to h/o RA Rash slightly improved with clobetasol  cream, continue for now Referred to dermatology, but he did not see them

## 2024-03-04 NOTE — Patient Instructions (Addendum)
 Schedule your Medicare Annual Wellness Visit at checkout.  Please start taking Aspirin 81 mg once daily.  Please continue to take medications as prescribed.  Please continue to follow low carb diet and perform moderate exercise/walking at least 150 mins/week.

## 2024-03-04 NOTE — Progress Notes (Signed)
 Established Patient Office Visit  Subjective:  Patient ID: Darrell Terry, male    DOB: [birth year]  Age: 58 y.o. MRN: [id number]  CC:  Chief Complaint  Patient presents with   Medical Management of Chronic Issues    4 month f/u     HPI Darrell Terry is a 58 y.o. male with past medical history of HTN, DM, RA and GERD who presents for f/u of his chronic medical conditions.  HTN: BP is well-controlled. Takes medications regularly. Patient denies headache, dizziness, chest pain, dyspnea or palpitations.  Type 2 DM: His HbA1C was 6.6 in 03/25.  He is taking metformin  500 mg once daily now. Denies any polyuria or polydipsia.  RA: He is on Plaquenil only now. His joint pain and swelling has been the same without Humira.  MDD and GAD: He reports anxiety and feeling low at times now.  He lives alone and does not have local family or friends.  He had increased smoking due to feeling anxious, but has cut down back to less than 1 pack/day now. He tried Wellbutrin  again, but it was not helping him and he has stopped taking it after 1 month. He attributes his symptoms to his age and inability to do activities as he used to do because of RA. He prefers to avoid any antidepressant for now. His appetite has improved since stopping marijuana use.  GERD and nausea: His nausea has improved now. He takes Omeprazole  QOD.  He reports difficulty maintaining erection.  Denies dysuria, hematuria, urethral discharge currently.  Past Medical History:  Diagnosis Date   Depression    Diabetes mellitus without complication (HCC)    Rheumatoid arthritis (HCC)     Past Surgical History:  Procedure Laterality Date   CERVICAL SPINE SURGERY     X 3   COLONOSCOPY N/A 03/26/2015   Procedure: COLONOSCOPY;  Surgeon: Lamar CHRISTELLA Hollingshead, MD;  [facility];  Service: Endoscopy;  Laterality: N/A;  8:00 AM   TONSILLECTOMY      Family History  Problem Relation Age of Onset   Stroke Other         undocumented which relative    High Cholesterol Other        undocumented which relative    Heart disease Other        undocumented which relative    Cancer - Other Other        undocumented which relative     Social History   Socioeconomic History   Marital status: Divorced    Spouse name: Not on file   Number of children: Not on file   Years of education: Not on file   Highest education level: GED or equivalent  Occupational History   Not on file  Tobacco Use   Smoking status: Some Days    Current packs/day: 1.00    Average packs/day: 1 pack/day for 34.0 years (34.0 ttl pk-yrs)    Types: Cigarettes   Smokeless tobacco: Never  Vaping Use   Vaping status: Never Used  Substance and Sexual Activity   Alcohol  use: No    Alcohol /week: 0.0 standard drinks of alcohol    Drug use: Yes    Types: Marijuana    Comment: has not used in couple months    Sexual activity: Not on file  Other Topics Concern   Not on file  Social History Narrative   ** Merged History Encounter **       Social Drivers of Health  Financial Resource Strain: Low Risk  (09/29/2023)   Overall Financial Resource Strain (CARDIA)    Difficulty of Paying Living Expenses: Not very hard  Food Insecurity: No Food Insecurity (09/29/2023)   Hunger Vital Sign    Worried About Running Out of Food in the Last Year: Never true    Ran Out of Food in the Last Year: Never true  Transportation Needs: No Transportation Needs (09/29/2023)   PRAPARE - Administrator, Civil Service (Medical): No    Lack of Transportation (Non-Medical): No  Physical Activity: Insufficiently Active (09/29/2023)   Exercise Vital Sign    Days of Exercise per Week: 1 day    Minutes of Exercise per Session: 10 min  Stress: Stress Concern Present (09/29/2023)   [employer] of Occupational Health - Occupational Stress Questionnaire    Feeling of Stress : To some extent  Social Connections: Moderately Isolated (09/29/2023)   Social  Connection and Isolation Panel    Frequency of Communication with Friends and Family: Twice a week    Frequency of Social Gatherings with Friends and Family: Once a week    Attends Religious Services: 1 to 4 times per year    Active Member of [employer] or Organizations: No    Attends Engineer, structural: Not on file    Marital Status: Divorced  Intimate Partner Violence: Not At Risk (04/19/2021)   Humiliation, Afraid, Rape, and Kick questionnaire    Fear of Current or Ex-Partner: No    Emotionally Abused: No    Physically Abused: No    Sexually Abused: No    Outpatient Medications Prior to Visit  Medication Sig Dispense Refill   albuterol  (VENTOLIN  HFA) 108 (90 Base) MCG/ACT inhaler Inhale 2 puffs into the lungs every 6 (six) hours as needed for wheezing or shortness of breath. 8 g 5   aspirin EC 81 MG tablet Take 81 mg by mouth daily. Swallow whole.     atorvastatin  (LIPITOR) 40 MG tablet TAKE 1 TABLET BY MOUTH ONCE DAILY. 90 tablet 3   Cholecalciferol 25 MCG (1000 UT) capsule Take by mouth.     clobetasol  cream (TEMOVATE ) 0.05 % APPLY TO THE AFFECTED AREA(S) TWICE DAILY 60 g 1   hydroxychloroquine (PLAQUENIL) 200 MG tablet Take 200 mg by mouth 2 (two) times daily.     lisinopril -hydrochlorothiazide  (ZESTORETIC ) 10-12.5 MG tablet Take 1 tablet by mouth daily. 90 tablet 1   Omega-3 Fatty Acids (FISH OIL) 1000 MG CAPS Take 5,000 mg by mouth daily.      omeprazole  (PRILOSEC) 40 MG capsule Take 1 capsule (40 mg total) by mouth daily as needed (Acid reflux). 90 capsule 1   sildenafil  (VIAGRA ) 50 MG tablet Take 1 tablet (50 mg total) by mouth daily as needed for erectile dysfunction. 30 tablet 0   metFORMIN  (GLUCOPHAGE ) 500 MG tablet Take 1 tablet (500 mg total) by mouth daily. 90 tablet 1   No facility-administered medications prior to visit.    No Known Allergies  ROS Review of Systems  Constitutional:  Negative for chills and fever.  HENT:  Negative for congestion and sore  throat.   Eyes:  Negative for pain and discharge.  Respiratory:  Negative for cough and shortness of breath.   Cardiovascular:  Negative for chest pain, palpitations and leg swelling.  Gastrointestinal:  Negative for constipation, diarrhea and vomiting.  Endocrine: Negative for polydipsia and polyuria.  Genitourinary:  Negative for dysuria and hematuria.  Musculoskeletal:  Positive for arthralgias, back  pain and neck pain. Negative for neck stiffness.  Skin:  Positive for rash.  Neurological:  Negative for dizziness, weakness, numbness and headaches.  Psychiatric/Behavioral:  Positive for dysphoric mood. Negative for agitation, behavioral problems and suicidal ideas.       Objective:    Physical Exam Vitals reviewed.  Constitutional:      General: He is not in acute distress.    Appearance: He is not diaphoretic.  HENT:     Head: Normocephalic and atraumatic.     Nose: Nose normal.     Mouth/Throat:     Mouth: Mucous membranes are moist.  Eyes:     General: No scleral icterus.    Extraocular Movements: Extraocular movements intact.  Cardiovascular:     Rate and Rhythm: Normal rate and regular rhythm.     Pulses: Normal pulses.     Heart sounds: Normal heart sounds. No murmur heard.    No friction rub. No gallop.  Pulmonary:     Breath sounds: Normal breath sounds. No wheezing or rales.  Abdominal:     Palpations: Abdomen is soft.     Tenderness: There is no abdominal tenderness.     Hernia: A hernia (Umbilical) is present.  Musculoskeletal:        General: No swelling. Normal range of motion.     Cervical back: Neck supple. No tenderness.     Right lower leg: No edema.     Left lower leg: No edema.  Skin:    General: Skin is warm.     Findings: Rash (Erythematous rash over right leg - improved compared to prior) present.  Neurological:     General: No focal deficit present.     Mental Status: He is alert and oriented to person, place, and time.  Psychiatric:         Mood and Affect: Mood normal.        Behavior: Behavior normal.     BP 117/79   Pulse 93   Ht 6' (1.829 m)   Wt 210 lb 12.8 oz (95.6 kg)   SpO2 94%   BMI 28.59 kg/m  Wt Readings from Last 3 Encounters:  03/04/24 210 lb 12.8 oz (95.6 kg)  10/20/23 201 lb 12.8 oz (91.5 kg)  09/30/23 205 lb 12.8 oz (93.4 kg)    Lab Results  Component Value Date   TSH 1.930 10/20/2023   Lab Results  Component Value Date   WBC 6.9 10/20/2023   HGB 15.6 10/20/2023   HCT 47.2 10/20/2023   MCV 83 10/20/2023   PLT 195 10/20/2023   Lab Results  Component Value Date   NA 142 10/20/2023   K 4.9 10/20/2023   CO2 26 10/20/2023   GLUCOSE 141 (H) 10/20/2023   BUN 10 10/20/2023   CREATININE 1.18 10/20/2023   BILITOT 0.4 10/20/2023   ALKPHOS 116 10/20/2023   AST 16 10/20/2023   ALT 18 10/20/2023   PROT 7.8 10/20/2023   ALBUMIN 4.7 10/20/2023   CALCIUM  9.9 10/20/2023   EGFR 71 10/20/2023   Lab Results  Component Value Date   CHOL 142 10/20/2023   Lab Results  Component Value Date   HDL 35 (L) 10/20/2023   Lab Results  Component Value Date   LDLCALC 67 10/20/2023   Lab Results  Component Value Date   TRIG 248 (H) 10/20/2023   Lab Results  Component Value Date   CHOLHDL 4.1 10/20/2023   Lab Results  Component Value Date  HGBA1C 6.6 (H) 10/20/2023      Assessment & Plan:   Problem List Items Addressed This Visit       Cardiovascular and Mediastinum   Hypertension - Primary   BP Readings from Last 1 Encounters:  03/04/24 117/79   Well-controlled with Lisinopril -HCTZ now Counseled for compliance with the medications Advised DASH diet and moderate exercise/walking, at least 150 mins/week      Relevant Medications   aspirin EC 81 MG tablet   Atherosclerosis of native coronary artery of native heart without angina pectoris   Noted on CT chest Currently denies chest pain On statin, advised to start Aspirin 81 mg once daily Referred to Cardiology      Relevant  Medications   aspirin EC 81 MG tablet   Other Relevant Orders   Ambulatory referral to Cardiology     Respiratory   Pulmonary emphysema (HCC)   Noted on CT chest Mild dyspnea with exertion, has Albuterol  PRN Trying to cut down smoking        Endocrine   DM (diabetes mellitus), type 2, with other specified complication   Lab Results  Component Value Date   HGBA1C 6.6 (H) 10/20/2023   Overall well-controlled Associated with HTN, HLD and GERD On Metformin  500 mg QD Advised to follow diabetic diet On statin and ACEi Diabetic eye exam: Advised to follow up with Ophthalmology for diabetic eye exam      Relevant Medications   metFORMIN  (GLUCOPHAGE ) 500 MG tablet   aspirin EC 81 MG tablet   Other Relevant Orders   Bayer DCA Hb A1c Waived   Microalbumin / creatinine urine ratio     Musculoskeletal and Integument   Rheumatoid arthritis involving multiple sites with positive rheumatoid factor (HCC)   On Plaquenil Follows up with Rheumatologist Advised eye exam as he takes Plaquenil      Relevant Medications   aspirin EC 81 MG tablet   Psoriasiform eruption   Rash appears to be psoriasiform, concerning due to h/o RA Rash slightly improved with clobetasol  cream, continue for now Referred to dermatology, but he did not see them        Other   Hyperlipidemia   On statin Reviewed lipid profile      Relevant Medications   aspirin EC 81 MG tablet   Tobacco abuse   Smokes about 1 pack/day. Has cut down from 2 pack/day.  Asked about quitting: confirms that he currently smokes cigarettes Advise to quit smoking: Educated about QUITTING to reduce the risk of cancer, cardio and cerebrovascular disease. Assess willingness: Unwilling to quit at this time, but is working on cutting back. Assist with counseling and pharmacotherapy: Counseled for 5 minutes and literature provided. Did not like Chantix  in the past. Has tried Wellbutrin  as well. Arrange for follow up: follow up in 4  months and continue to offer help.      Moderate episode of recurrent major depressive disorder (HCC)   Currently controlled Had started Wellbutrin  150 mg QD for MDD and smoking cessation,  but did not continue as it was ineffective Denies BH therapy referral Does not have family or friends in the community, but does not want help No SI or HI          Meds ordered this encounter  Medications   metFORMIN  (GLUCOPHAGE ) 500 MG tablet    Sig: Take 1 tablet (500 mg total) by mouth daily.    Dispense:  90 tablet    Refill:  1  Follow-up: Return in about 4 months (around 07/04/2024) for DM and HTN.    Suzzane MARLA Blanch, MD

## 2024-03-04 NOTE — Assessment & Plan Note (Signed)
 Noted on CT chest ?Mild dyspnea with exertion, has Albuterol PRN ?Trying to cut down smoking ?

## 2024-03-04 NOTE — Assessment & Plan Note (Signed)
 Lab Results  Component Value Date   HGBA1C 6.6 (H) 10/20/2023   Overall well-controlled Associated with HTN, HLD and GERD On Metformin  500 mg QD Advised to follow diabetic diet On statin and ACEi Diabetic eye exam: Advised to follow up with Ophthalmology for diabetic eye exam

## 2024-03-07 DIAGNOSIS — H18512 Endothelial corneal dystrophy, left eye: Secondary | ICD-10-CM | POA: Diagnosis not present

## 2024-03-07 DIAGNOSIS — H25812 Combined forms of age-related cataract, left eye: Secondary | ICD-10-CM | POA: Diagnosis not present

## 2024-03-07 LAB — MICROALBUMIN / CREATININE URINE RATIO
Creatinine, Urine: 133.6 mg/dL
Microalb/Creat Ratio: 4 mg/g{creat} (ref 0–29)
Microalbumin, Urine: 4.8 ug/mL

## 2024-03-08 LAB — BAYER DCA HB A1C WAIVED: HB A1C (BAYER DCA - WAIVED): 7.1 % — ABNORMAL HIGH (ref 4.8–5.6)

## 2024-03-09 ENCOUNTER — Encounter: Payer: Self-pay | Admitting: Internal Medicine

## 2024-03-09 ENCOUNTER — Ambulatory Visit: Attending: Internal Medicine | Admitting: Internal Medicine

## 2024-03-09 ENCOUNTER — Telehealth: Payer: Self-pay | Admitting: Internal Medicine

## 2024-03-09 VITALS — BP 138/74 | HR 88 | Ht 72.0 in | Wt 214.2 lb

## 2024-03-09 DIAGNOSIS — I2510 Atherosclerotic heart disease of native coronary artery without angina pectoris: Secondary | ICD-10-CM | POA: Diagnosis not present

## 2024-03-09 DIAGNOSIS — R0609 Other forms of dyspnea: Secondary | ICD-10-CM | POA: Diagnosis not present

## 2024-03-09 NOTE — Progress Notes (Signed)
 Cardiology Office Note  Date: 03/09/2024   ID: AEMON KOELLER, DOB 02-Jan-1966, MRN [id number]  PCP:  Tobie Suzzane POUR, MD  Cardiologist:  None Electrophysiologist:  None   History of Present Illness: Darrell Terry is a 58 y.o. male  Referred to cardiology clinic for evaluation of coronary artery calcifications on CT chest.  Patient smokes cigarettes, cut back from 2 packs/day to 1 pack/day.  Does not have any symptoms of angina but has DOE progressively worsening in the last 1 year.  It happens with over exertional activities.  He can walk at his own pace with no issues.  His blood pressures are controlled at home.  No other symptoms of dizziness, palpitations, syncope or leg swelling.  Past Medical History:  Diagnosis Date   Depression    Diabetes mellitus without complication (HCC)    Rheumatoid arthritis (HCC)     Past Surgical History:  Procedure Laterality Date   CERVICAL SPINE SURGERY     X 3   COLONOSCOPY N/A 03/26/2015   Procedure: COLONOSCOPY;  Surgeon: Lamar CHRISTELLA Hollingshead, MD;  [facility];  Service: Endoscopy;  Laterality: N/A;  8:00 AM   TONSILLECTOMY      Current Outpatient Medications  Medication Sig Dispense Refill   albuterol  (VENTOLIN  HFA) 108 (90 Base) MCG/ACT inhaler Inhale 2 puffs into the lungs every 6 (six) hours as needed for wheezing or shortness of breath. 8 g 5   aspirin EC 81 MG tablet Take 81 mg by mouth daily. Swallow whole.     atorvastatin  (LIPITOR) 40 MG tablet TAKE 1 TABLET BY MOUTH ONCE DAILY. 90 tablet 3   Cholecalciferol 25 MCG (1000 UT) capsule Take by mouth.     clobetasol  cream (TEMOVATE ) 0.05 % APPLY TO THE AFFECTED AREA(S) TWICE DAILY 60 g 1   hydroxychloroquine (PLAQUENIL) 200 MG tablet Take 200 mg by mouth 2 (two) times daily.     lisinopril -hydrochlorothiazide  (ZESTORETIC ) 10-12.5 MG tablet Take 1 tablet by mouth daily. 90 tablet 1   metFORMIN  (GLUCOPHAGE ) 500 MG tablet Take 1 tablet (500 mg total) by mouth daily. 90  tablet 1   Omega-3 Fatty Acids (FISH OIL) 1000 MG CAPS Take 5,000 mg by mouth daily.      omeprazole  (PRILOSEC OTC) 20 MG tablet Take 20 mg by mouth daily.     omeprazole  (PRILOSEC) 40 MG capsule Take 1 capsule (40 mg total) by mouth daily as needed (Acid reflux). 90 capsule 1   sildenafil  (VIAGRA ) 50 MG tablet Take 1 tablet (50 mg total) by mouth daily as needed for erectile dysfunction. 30 tablet 0   No current facility-administered medications for this visit.   Allergies:  Patient has no known allergies.   Social History: The patient  reports that he has been smoking cigarettes. He has a 34 pack-year smoking history. He has never used smokeless tobacco. He reports current drug use. Drug: Marijuana. He reports that he does not drink alcohol .   Family History: The patient's family history includes Cancer - Other in an other family member; Heart disease in an other family member; High Cholesterol in an other family member; Stroke in an other family member.   ROS:  Please see the history of present illness. Otherwise, complete review of systems is positive for none  All other systems are reviewed and negative.   Physical Exam: VS:  BP 138/74 (BP Location: Left Arm)   Pulse 88   Ht 6' (1.829 m)   Wt 214  lb 3.2 oz (97.2 kg)   SpO2 97%   BMI 29.05 kg/m , BMI Body mass index is 29.05 kg/m.  Wt Readings from Last 3 Encounters:  03/09/24 214 lb 3.2 oz (97.2 kg)  03/04/24 210 lb 12.8 oz (95.6 kg)  10/20/23 201 lb 12.8 oz (91.5 kg)    General: Patient appears comfortable at rest. HEENT: Conjunctiva and lids normal, oropharynx clear with moist mucosa. Neck: Supple, no elevated JVP or carotid bruits, no thyromegaly. Lungs: Clear to auscultation, nonlabored breathing at rest. Cardiac: Regular rate and rhythm, no S3 or significant systolic murmur, no pericardial rub. Abdomen: Soft, nontender, no hepatomegaly, bowel sounds present, no guarding or rebound. Extremities: No pitting edema, distal  pulses 2+. Skin: Warm and dry. Musculoskeletal: No kyphosis. Neuropsychiatric: Alert and oriented x3, affect grossly appropriate.  Recent Labwork: 10/20/2023: ALT 18; AST 16; BUN 10; Creatinine, Ser 1.18; Hemoglobin 15.6; Platelets 195; Potassium 4.9; Sodium 142; TSH 1.930     Component Value Date/Time   CHOL 142 10/20/2023 0836   TRIG 248 (H) 10/20/2023 0836   HDL 35 (L) 10/20/2023 0836   CHOLHDL 4.1 10/20/2023 0836   CHOLHDL 5.0 (H) 04/25/2020 0951   LDLCALC 67 10/20/2023 0836   LDLCALC 140 (H) 04/25/2020 9048    Other Studies Reviewed Today:   Assessment and Plan:  HTN, controlled - Continue lisinopril -HCTZ 10-12.5 mg once daily.  Coronary artery calcifications DOE - Imaging evidence of coronary calcifications and worsening DOE in the last 1 year with over exertional activities.  Obtain Lexiscan . - Heart healthy diet and exercise recommended. - Discussed symptoms of CAD and MI with the patient. - ER precautions for chest pain provided. - Continue aspirin 81 mg once daily, atorvastatin  40 mg nightly.  Nicotine abuse - Smokes 1 pack/day.  Previously cut back from 2 packs/day.  Counseling provided.  Motivated to quit.   I spent 45 minutes in reviewing the prior records, imaging, more than 3 labs, discussing HTN, coronary calcifications, symptoms and nicotine abuse with the patient, documentation.   Medication Adjustments/Labs and Tests Ordered: Current medicines are reviewed at length with the patient today.  Concerns regarding medicines are outlined above.    Disposition:  Follow up pending results  Signed April Colter Priya Tejay Hubert, MD, 03/09/2024 1:13 PM    [facility] [address] [place], [place], [place] [address]

## 2024-03-09 NOTE — Telephone Encounter (Signed)
 Pt calling back to reschedule appt on 12/12 w Mallipeddi.

## 2024-03-09 NOTE — Patient Instructions (Addendum)
 Medication Instructions:  Your physician recommends that you continue on your current medications as directed. Please refer to the Current Medication list given to you today.   Labwork: None  Testing/Procedures: Your physician has requested that you have a lexiscan myoview. For further information please visit [email or web address] Please follow instruction sheet, as given.   Follow-Up: Your physician recommends that you schedule a follow-up appointment in: Pending Results  Any Other Special Instructions Will Be Listed Below (If Applicable). Thank you for choosing [facility]!     If you need a refill on your cardiac medications before your next appointment, please call your pharmacy.

## 2024-03-15 ENCOUNTER — Other Ambulatory Visit: Payer: Self-pay | Admitting: Physician Assistant

## 2024-03-15 ENCOUNTER — Encounter (HOSPITAL_COMMUNITY): Payer: Self-pay

## 2024-03-15 ENCOUNTER — Ambulatory Visit: Payer: Self-pay | Admitting: Internal Medicine

## 2024-03-15 ENCOUNTER — Ambulatory Visit (HOSPITAL_COMMUNITY)
Admission: RE | Admit: 2024-03-15 | Discharge: 2024-03-15 | Disposition: A | Discharge status: Home / Self Care | Source: Ambulatory Visit | Attending: Internal Medicine | Admitting: Internal Medicine

## 2024-03-15 ENCOUNTER — Encounter (HOSPITAL_COMMUNITY)
Admission: RE | Admit: 2024-03-15 | Discharge: 2024-03-15 | Disposition: A | Discharge status: Home / Self Care | Source: Ambulatory Visit | Attending: Internal Medicine | Admitting: Internal Medicine

## 2024-03-15 DIAGNOSIS — R0609 Other forms of dyspnea: Secondary | ICD-10-CM | POA: Insufficient documentation

## 2024-03-15 HISTORY — DX: Essential (primary) hypertension: I10

## 2024-03-15 HISTORY — DX: Systemic involvement of connective tissue, unspecified: M35.9

## 2024-03-15 LAB — NM MYOCAR MULTI W/SPECT W/WALL MOTION / EF
Angina Index: 0
Base ST Depression (mm): 0 mm
Duke Treadmill Score: 7
Estimated workload: 7
Exercise duration (min): 7 min
Exercise duration (sec): 18 s
LV dias vol: 84 mL (ref 62–150)
LV sys vol: 32 mL (ref 4.2–5.8)
MPHR: 161 {beats}/min
Nuc Stress EF: 62 %
Peak HR: 131 {beats}/min
Percent HR: 81 %
RATE: 0.3
RPE: 17
Rest HR: 83 {beats}/min
Rest Nuclear Isotope Dose: 11 mCi
SDS: 1
SRS: 2
SSS: 3
ST Depression (mm): 0 mm
Stress Nuclear Isotope Dose: 31 mCi
TID: 0.93

## 2024-03-15 MED ORDER — TECHNETIUM TC 99M TETROFOSMIN IV KIT
10.0000 | PACK | Freq: Once | INTRAVENOUS | Status: AC | PRN
  Administered 2024-03-15: 11 via INTRAVENOUS

## 2024-03-15 MED ORDER — REGADENOSON 0.4 MG/5ML IV SOLN
INTRAVENOUS | Status: AC
  Administered 2024-03-15: 0.4 mg via INTRAVENOUS
  Filled 2024-03-15: qty 5

## 2024-03-15 MED ORDER — TECHNETIUM TC 99M TETROFOSMIN IV KIT
30.0000 | PACK | Freq: Once | INTRAVENOUS | Status: AC | PRN
  Administered 2024-03-15: 31 via INTRAVENOUS

## 2024-03-15 MED ORDER — SODIUM CHLORIDE FLUSH 0.9 % IV SOLN
INTRAVENOUS | Status: AC
  Administered 2024-03-15: 10 mL via INTRAVENOUS
  Filled 2024-03-15: qty 10

## 2024-03-15 NOTE — Progress Notes (Signed)
     Darrell Terry presented for a  nuclear stress test today.  I Darrell CINDERELLA Kapur, PA-C, provided direct supervision and was present during the stress portion of the study today, which was completed without significant symptoms, immediate complications, or acute ST/T changes on ECG.  Stress imaging is pending at this time.  Preliminary ECG findings may be listed in the chart, but the stress test result will not be finalized until perfusion imaging is complete.  Darrell CINDERELLA Kapur, PA-C  03/15/2024, 9:50 AM

## 2024-04-06 DIAGNOSIS — E1136 Type 2 diabetes mellitus with diabetic cataract: Secondary | ICD-10-CM | POA: Diagnosis not present

## 2024-04-06 DIAGNOSIS — H18512 Endothelial corneal dystrophy, left eye: Secondary | ICD-10-CM | POA: Diagnosis not present

## 2024-04-06 DIAGNOSIS — H25812 Combined forms of age-related cataract, left eye: Secondary | ICD-10-CM | POA: Diagnosis not present

## 2024-04-06 DIAGNOSIS — Z01818 Encounter for other preprocedural examination: Secondary | ICD-10-CM | POA: Diagnosis not present

## 2024-04-06 DIAGNOSIS — I10 Essential (primary) hypertension: Secondary | ICD-10-CM | POA: Diagnosis not present

## 2024-05-12 ENCOUNTER — Telehealth: Admitting: Emergency Medicine

## 2024-05-12 ENCOUNTER — Telehealth: Admitting: Physician Assistant

## 2024-05-12 DIAGNOSIS — J069 Acute upper respiratory infection, unspecified: Secondary | ICD-10-CM | POA: Diagnosis not present

## 2024-05-12 DIAGNOSIS — R051 Acute cough: Secondary | ICD-10-CM

## 2024-05-12 MED ORDER — BENZONATATE 100 MG PO CAPS: 100.0000 mg | ORAL_CAPSULE | Freq: Three times a day (TID) | ORAL | 0 refills | Status: AC | PRN

## 2024-05-12 MED ORDER — FLUTICASONE PROPIONATE 50 MCG/ACT NA SUSP: 2.0000 | Freq: Every day | NASAL | 0 refills | Status: AC

## 2024-05-12 NOTE — Progress Notes (Signed)

## 2024-05-12 NOTE — Progress Notes (Signed)
  Because you have congested lungs and a fever, I feel your condition warrants further evaluation and I recommend that you be seen in a face-to-face visit. You can be seen by your primary care provider or at an urgent care.    NOTE: There will be NO CHARGE for this E-Visit   If you are having a true medical emergency, please call 911.     For an urgent face to face visit, [place] has multiple urgent care centers for your convenience.  Click the link below for the full list of locations and hours, walk-in wait times, appointment scheduling options and driving directions:  Urgent Care - [place], [place], [place], [place], [place], [place]  [place]     Your MyChart E-visit questionnaire answers were reviewed by a board certified advanced clinical practitioner to complete your personal care plan based on your specific symptoms.    Thank you for using e-Visits.

## 2024-05-13 ENCOUNTER — Encounter: Payer: Self-pay | Admitting: Internal Medicine

## 2024-05-13 ENCOUNTER — Other Ambulatory Visit: Payer: Self-pay | Admitting: Internal Medicine

## 2024-05-13 DIAGNOSIS — J209 Acute bronchitis, unspecified: Secondary | ICD-10-CM

## 2024-05-13 MED ORDER — AZITHROMYCIN 250 MG PO TABS: ORAL_TABLET | ORAL | 0 refills | Status: AC

## 2024-06-17 ENCOUNTER — Telehealth: Payer: Self-pay | Admitting: Internal Medicine

## 2024-06-17 DIAGNOSIS — R21 Rash and other nonspecific skin eruption: Secondary | ICD-10-CM | POA: Diagnosis not present

## 2024-06-17 DIAGNOSIS — M150 Primary generalized (osteo)arthritis: Secondary | ICD-10-CM | POA: Diagnosis not present

## 2024-06-17 DIAGNOSIS — Z79899 Other long term (current) drug therapy: Secondary | ICD-10-CM | POA: Diagnosis not present

## 2024-06-17 DIAGNOSIS — M0579 Rheumatoid arthritis with rheumatoid factor of multiple sites without organ or systems involvement: Secondary | ICD-10-CM | POA: Diagnosis not present

## 2024-06-17 NOTE — Telephone Encounter (Unsigned)
 Copied from CRM #[id number]. Topic: Referral - Status >> Jun 17, 2024  2:06 PM Victoria B wrote: Reason for CRM: Patient called in states the Dermatology he was referred to can't see him for 8 months, so he is requesting that the referral be sent to [facility] in [place] where they can see him in 48hrs. He states with what's going on he needs this done asap so he can be seen there. Fx is [phone]

## 2024-06-20 ENCOUNTER — Encounter: Payer: Self-pay | Admitting: Internal Medicine

## 2024-06-20 ENCOUNTER — Other Ambulatory Visit: Payer: Self-pay

## 2024-06-20 DIAGNOSIS — L308 Other specified dermatitis: Secondary | ICD-10-CM

## 2024-06-21 NOTE — Telephone Encounter (Signed)
 Referral sent to: [facility] [address], [place] [address] [phone]  [place] Patient requested.

## 2024-07-04 ENCOUNTER — Other Ambulatory Visit: Payer: Self-pay | Admitting: Internal Medicine

## 2024-07-04 DIAGNOSIS — K219 Gastro-esophageal reflux disease without esophagitis: Secondary | ICD-10-CM

## 2024-07-06 ENCOUNTER — Ambulatory Visit: Admitting: Internal Medicine

## 2024-07-08 ENCOUNTER — Other Ambulatory Visit: Payer: Self-pay | Admitting: Internal Medicine

## 2024-07-08 ENCOUNTER — Other Ambulatory Visit (HOSPITAL_COMMUNITY): Payer: Self-pay

## 2024-07-08 ENCOUNTER — Ambulatory Visit: Admitting: Internal Medicine

## 2024-07-08 ENCOUNTER — Telehealth: Payer: Self-pay | Admitting: Pharmacy Technician

## 2024-07-08 ENCOUNTER — Encounter: Payer: Self-pay | Admitting: Internal Medicine

## 2024-07-08 ENCOUNTER — Ambulatory Visit (INDEPENDENT_AMBULATORY_CARE_PROVIDER_SITE_OTHER): Admitting: Internal Medicine

## 2024-07-08 VITALS — BP 129/83 | HR 90 | Ht 72.0 in | Wt 213.2 lb

## 2024-07-08 DIAGNOSIS — Z7984 Long term (current) use of oral hypoglycemic drugs: Secondary | ICD-10-CM | POA: Diagnosis not present

## 2024-07-08 DIAGNOSIS — L409 Psoriasis, unspecified: Secondary | ICD-10-CM

## 2024-07-08 DIAGNOSIS — Z23 Encounter for immunization: Secondary | ICD-10-CM

## 2024-07-08 DIAGNOSIS — I10 Essential (primary) hypertension: Secondary | ICD-10-CM | POA: Diagnosis not present

## 2024-07-08 DIAGNOSIS — F331 Major depressive disorder, recurrent, moderate: Secondary | ICD-10-CM

## 2024-07-08 DIAGNOSIS — E1169 Type 2 diabetes mellitus with other specified complication: Secondary | ICD-10-CM

## 2024-07-08 MED ORDER — METFORMIN HCL 500 MG PO TABS: 500.0000 mg | ORAL_TABLET | Freq: Two times a day (BID) | ORAL | 1 refills | Status: AC

## 2024-07-08 MED ORDER — CALCIPOTRIENE 0.005 % EX OINT: TOPICAL_OINTMENT | Freq: Two times a day (BID) | CUTANEOUS | 2 refills | Status: DC

## 2024-07-08 MED ORDER — CALCIPOTRIENE 0.005 % EX CREA: TOPICAL_CREAM | Freq: Two times a day (BID) | CUTANEOUS | 2 refills | Status: AC

## 2024-07-08 MED ORDER — CLOBETASOL PROPIONATE 0.05 % EX CREA: TOPICAL_CREAM | Freq: Two times a day (BID) | CUTANEOUS | 1 refills | Status: AC

## 2024-07-08 NOTE — Progress Notes (Signed)
 Established Patient Office Visit  Subjective:  Patient ID: Darrell Terry, male    DOB: 12/23/1965  Age: 58 y.o. MRN: [id number]  CC:  Chief Complaint  Patient presents with   Diabetes    4 month follow up   Hypertension    4 month follow up    HPI Darrell Terry is a 58 y.o. male with past medical history of HTN, DM, RA and GERD who presents for f/u of his chronic medical conditions.  HTN: BP is well-controlled. Takes medications regularly. Patient denies headache, dizziness, chest pain, dyspnea or palpitations.  Type 2 DM: His HbA1C was 7.5 today.  He is taking metformin  500 mg once daily now. Denies any polyuria or polydipsia.  RA: He is on Plaquenil only now. His joint pain and swelling has been the same without Humira.  MDD and GAD: He reports anxiety and feeling low at times now.  He lives alone and does not have local family or friends.  He had increased smoking due to feeling anxious, but has cut down back to less than 1 pack/day now. He tried Wellbutrin , but it was not helping him and he has stopped taking it after 1 month. He attributes his symptoms to his age and inability to do activities as he used to do because of RA. He prefers to avoid any antidepressant for now. His appetite has improved since stopping marijuana use.  GERD and nausea: His nausea has improved now. He takes Omeprazole  QOD.  Psoriasis: He has multiple skin lesions on bilateral lower extremities, which have been worse recently.  He has started noticing skin lesions on back and on the left elbow as well.  He was given clobetasol  cream previously, which had helped initially.  He was referred to dermatology previously, but has not been able to see them yet.  Past Medical History:  Diagnosis Date   Collagen vascular disease    Depression    Diabetes mellitus without complication (HCC)    Hypertension    Rheumatoid arthritis (HCC)     Past Surgical History:  Procedure Laterality Date   CERVICAL  SPINE SURGERY     X 3   COLONOSCOPY N/A 03/26/2015   Procedure: COLONOSCOPY;  Surgeon: Lamar CHRISTELLA Hollingshead, MD;  Location: [facility];  Service: Endoscopy;  Laterality: N/A;  8:00 AM   TONSILLECTOMY      Family History  Problem Relation Age of Onset   Stroke Other        undocumented which relative    High Cholesterol Other        undocumented which relative    Heart disease Other        undocumented which relative    Cancer - Other Other        undocumented which relative     Social History   Socioeconomic History   Marital status: Divorced    Spouse name: Not on file   Number of children: Not on file   Years of education: Not on file   Highest education level: GED or equivalent  Occupational History   Not on file  Tobacco Use   Smoking status: Some Days    Current packs/day: 1.00    Average packs/day: 1 pack/day for 34.0 years (34.0 ttl pk-yrs)    Types: Cigarettes   Smokeless tobacco: Never  Vaping Use   Vaping status: Never Used  Substance and Sexual Activity   Alcohol  use: No    Alcohol /week: 0.0 standard drinks  of alcohol    Drug use: Yes    Types: Marijuana    Comment: has not used in couple months    Sexual activity: Not on file  Other Topics Concern   Not on file  Social History Narrative   ** Merged History Encounter **       Social Drivers of Health   Tobacco Use: High Risk (07/08/2024)   Patient History    Smoking Tobacco Use: Some Days    Smokeless Tobacco Use: Never    Passive Exposure: Not on file  Financial Resource Strain: Low Risk (07/06/2024)   Overall Financial Resource Strain (CARDIA)    Difficulty of Paying Living Expenses: Not hard at all  Food Insecurity: No Food Insecurity (07/06/2024)   Epic    Worried About Programme Researcher, Broadcasting/film/video in the Last Year: Never true    Ran Out of Food in the Last Year: Never true  Transportation Needs: No Transportation Needs (07/06/2024)   Epic    Lack of Transportation (Medical): No    Lack of  Transportation (Non-Medical): No  Physical Activity: Inactive (07/06/2024)   Exercise Vital Sign    Days of Exercise per Week: 0 days    Minutes of Exercise per Session: Not on file  Stress: Stress Concern Present (07/06/2024)   [employer] of Occupational Health - Occupational Stress Questionnaire    Feeling of Stress: To some extent  Social Connections: Moderately Isolated (07/06/2024)   Social Connection and Isolation Panel    Frequency of Communication with Friends and Family: Twice a week    Frequency of Social Gatherings with Friends and Family: Once a week    Attends Religious Services: 1 to 4 times per year    Active Member of [employer] or Organizations: No    Attends Engineer, Structural: Not on file    Marital Status: Divorced  Intimate Partner Violence: Not on file  Depression (PHQ2-9): Low Risk (07/08/2024)   Depression (PHQ2-9)    PHQ-2 Score: 0  Alcohol  Screen: Not on file  Housing: Low Risk (07/06/2024)   Epic    Unable to Pay for Housing in the Last Year: No    Number of Times Moved in the Last Year: 0    Homeless in the Last Year: No  Utilities: Not on file  Health Literacy: Not on file    Outpatient Medications Prior to Visit  Medication Sig Dispense Refill   albuterol  (VENTOLIN  HFA) 108 (90 Base) MCG/ACT inhaler Inhale 2 puffs into the lungs every 6 (six) hours as needed for wheezing or shortness of breath. 8 g 5   aspirin EC 81 MG tablet Take 81 mg by mouth daily. Swallow whole.     atorvastatin  (LIPITOR) 40 MG tablet TAKE 1 TABLET BY MOUTH ONCE DAILY. 90 tablet 3   benzonatate  (TESSALON ) 100 MG capsule Take 1-2 capsules (100-200 mg total) by mouth 3 (three) times daily as needed. 30 capsule 0   Cholecalciferol 25 MCG (1000 UT) capsule Take by mouth.     fluticasone  (FLONASE ) 50 MCG/ACT nasal spray Place 2 sprays into both nostrils daily. 16 g 0   hydroxychloroquine (PLAQUENIL) 200 MG tablet Take 200 mg by mouth 2 (two) times daily.     Omega-3  Fatty Acids (FISH OIL) 1000 MG CAPS Take 5,000 mg by mouth daily.      omeprazole  (PRILOSEC) 40 MG capsule Take 1 capsule (40 mg total) by mouth daily as needed (Acid reflux). 90 capsule 1   sildenafil  (  VIAGRA ) 50 MG tablet Take 1 tablet (50 mg total) by mouth daily as needed for erectile dysfunction. 30 tablet 0   clobetasol  cream (TEMOVATE ) 0.05 % APPLY TO THE AFFECTED AREA(S) TWICE DAILY 60 g 1   lisinopril -hydrochlorothiazide  (ZESTORETIC ) 10-12.5 MG tablet Take 1 tablet by mouth daily. 90 tablet 1   metFORMIN  (GLUCOPHAGE ) 500 MG tablet Take 1 tablet (500 mg total) by mouth daily. 90 tablet 1   omeprazole  (PRILOSEC OTC) 20 MG tablet Take 20 mg by mouth daily.     No facility-administered medications prior to visit.    No Known Allergies  ROS Review of Systems  Constitutional:  Negative for chills and fever.  HENT:  Negative for congestion and sore throat.   Eyes:  Negative for pain and discharge.  Respiratory:  Negative for cough and shortness of breath.   Cardiovascular:  Negative for chest pain, palpitations and leg swelling.  Gastrointestinal:  Negative for constipation, diarrhea and vomiting.  Endocrine: Negative for polydipsia and polyuria.  Genitourinary:  Negative for dysuria and hematuria.  Musculoskeletal:  Positive for arthralgias, back pain and neck pain. Negative for neck stiffness.  Skin:  Positive for rash.  Neurological:  Negative for dizziness, weakness, numbness and headaches.  Psychiatric/Behavioral:  Positive for dysphoric mood. Negative for agitation, behavioral problems and suicidal ideas.       Objective:    Physical Exam Vitals reviewed.  Constitutional:      General: He is not in acute distress.    Appearance: He is not diaphoretic.  HENT:     Head: Normocephalic and atraumatic.     Nose: Nose normal.     Mouth/Throat:     Mouth: Mucous membranes are moist.  Eyes:     General: No scleral icterus.    Extraocular Movements: Extraocular movements  intact.  Cardiovascular:     Rate and Rhythm: Normal rate and regular rhythm.     Pulses: Normal pulses.     Heart sounds: Normal heart sounds. No murmur heard.    No friction rub. No gallop.  Pulmonary:     Breath sounds: Normal breath sounds. No wheezing or rales.  Abdominal:     Palpations: Abdomen is soft.     Tenderness: There is no abdominal tenderness.     Hernia: A hernia (Umbilical) is present.  Musculoskeletal:        General: No swelling. Normal range of motion.     Cervical back: Neck supple. No tenderness.     Right lower leg: No edema.     Left lower leg: No edema.  Skin:    General: Skin is warm.     Findings: Rash (Erythematous rash with silvery flakes over b/l LE, left elbow and back) present.  Neurological:     General: No focal deficit present.     Mental Status: He is alert and oriented to person, place, and time.  Psychiatric:        Mood and Affect: Mood normal.        Behavior: Behavior normal.     BP 129/83   Pulse 90   Ht 6' (1.829 m)   Wt 213 lb 3.2 oz (96.7 kg)   SpO2 100%   BMI 28.92 kg/m  Wt Readings from Last 3 Encounters:  07/08/24 213 lb 3.2 oz (96.7 kg)  03/09/24 214 lb 3.2 oz (97.2 kg)  03/04/24 210 lb 12.8 oz (95.6 kg)    Lab Results  Component Value Date   TSH 1.930 10/20/2023  Lab Results  Component Value Date   WBC 6.9 10/20/2023   HGB 15.6 10/20/2023   HCT 47.2 10/20/2023   MCV 83 10/20/2023   PLT 195 10/20/2023   Lab Results  Component Value Date   NA 142 10/20/2023   K 4.9 10/20/2023   CO2 26 10/20/2023   GLUCOSE 141 (H) 10/20/2023   BUN 10 10/20/2023   CREATININE 1.18 10/20/2023   BILITOT 0.4 10/20/2023   ALKPHOS 116 10/20/2023   AST 16 10/20/2023   ALT 18 10/20/2023   PROT 7.8 10/20/2023   ALBUMIN 4.7 10/20/2023   CALCIUM  9.9 10/20/2023   EGFR 71 10/20/2023   Lab Results  Component Value Date   CHOL 142 10/20/2023   Lab Results  Component Value Date   HDL 35 (L) 10/20/2023   Lab Results   Component Value Date   LDLCALC 67 10/20/2023   Lab Results  Component Value Date   TRIG 248 (H) 10/20/2023   Lab Results  Component Value Date   CHOLHDL 4.1 10/20/2023   Lab Results  Component Value Date   HGBA1C 7.1 (H) 03/04/2024      Assessment & Plan:   Problem List Items Addressed This Visit       Cardiovascular and Mediastinum   Hypertension - Primary (Chronic)   BP Readings from Last 1 Encounters:  07/08/24 129/83   Well-controlled with Lisinopril -HCTZ now Counseled for compliance with the medications Advised DASH diet and moderate exercise/walking, at least 150 mins/week        Endocrine   DM (diabetes mellitus), type 2, with other specified complication   Lab Results  Component Value Date   HGBA1C 7.1 (H) 03/04/2024   Uncontrolled Associated with HTN, HLD and GERD On Metformin  500 mg QD, increased dose to 500 mg BID Advised to follow diabetic diet On statin and ACEi Diabetic eye exam: Advised to follow up with Ophthalmology for diabetic eye exam      Relevant Medications   metFORMIN  (GLUCOPHAGE ) 500 MG tablet   Other Relevant Orders   Bayer DCA Hb A1c Waived     Musculoskeletal and Integument   Psoriasis   Rash appears to be psoriasiform, concerning due to h/o RA Rash had slightly improved with clobetasol  cream initially, but is spreading in b/l LE and back - continue Clobetasol  for now Added Calcipotriene ointment for Psoriasis Referred to dermatology again      Relevant Medications   calcipotriene (DOVONOX) 0.005 % ointment   clobetasol  cream (TEMOVATE ) 0.05 %   Other Relevant Orders   Ambulatory referral to Dermatology   Ambulatory referral to Dermatology     Other   Moderate episode of recurrent major depressive disorder (HCC)   Currently controlled Had started Wellbutrin  150 mg QD for MDD and smoking cessation,  but did not continue as it was ineffective Denies BH therapy referral Does not have family or friends in the  community, but does not want help No SI or HI      Other Visit Diagnoses       Encounter for immunization       Relevant Orders   Flu vaccine trivalent PF, 6mos and older(Flulaval,Afluria,Fluarix,Fluzone ) (Completed)           Meds ordered this encounter  Medications   calcipotriene (DOVONOX) 0.005 % ointment    Sig: Apply topically 2 (two) times daily.    Dispense:  60 g    Refill:  2   clobetasol  cream (TEMOVATE ) 0.05 %  Sig: Apply topically 2 (two) times daily. to affected area(s)    Dispense:  60 g    Refill:  1    This prescription was filled on 12/07/2023. Any refills authorized will be placed on file.   metFORMIN  (GLUCOPHAGE ) 500 MG tablet    Sig: Take 1 tablet (500 mg total) by mouth 2 (two) times daily with a meal.    Dispense:  180 tablet    Refill:  1    Follow-up: Return in about 4 months (around 11/06/2024).    Suzzane MARLA Blanch, MD

## 2024-07-08 NOTE — Addendum Note (Signed)
 Addended byBETHA TOBIE DOWNS on: 07/08/2024 11:14 AM   Modules accepted: Orders

## 2024-07-08 NOTE — Assessment & Plan Note (Addendum)
 Lab Results  Component Value Date   HGBA1C 7.1 (H) 03/04/2024   Uncontrolled Associated with HTN, HLD and GERD On Metformin  500 mg QD, increased dose to 500 mg BID Advised to follow diabetic diet On statin and ACEi Diabetic eye exam: Advised to follow up with Ophthalmology for diabetic eye exam

## 2024-07-08 NOTE — Assessment & Plan Note (Signed)
 BP Readings from Last 1 Encounters:  07/08/24 129/83   Well-controlled with Lisinopril -HCTZ now Counseled for compliance with the medications Advised DASH diet and moderate exercise/walking, at least 150 mins/week

## 2024-07-08 NOTE — Telephone Encounter (Signed)
 Pharmacy Patient Advocate Encounter  Received notification from [employer] that Prior Authorization for Calcipotriene 0.005% cream has been APPROVED from 07/29/2023 to 07/27/2025. Ran test claim, Copay is $0.00. This test claim was processed through [facility]- copay amounts may vary at other pharmacies due to pharmacy/plan contracts, or as the patient moves through the different stages of their insurance plan.   PA #/Case ID/Reference #: [id number]

## 2024-07-08 NOTE — Telephone Encounter (Signed)
 Pharmacy Patient Advocate Encounter   Received notification from CoverMyMeds that prior authorization for Calcipotriene 0.005% ointment is required/requested.   Insurance verification completed.   The patient is insured through [employer].   Per test claim:  SEE BELOW is preferred by the insurance.  If suggested medication is appropriate, Please send in a new RX and discontinue this one. If not, please advise as to why it's not appropriate so that we may request a Prior Authorization. Please note, some preferred medications may still require a PA.  If the suggested medications have not been trialed and there are no contraindications to their use, the PA will not be submitted, as it will not be approved.

## 2024-07-08 NOTE — Assessment & Plan Note (Signed)
 Currently controlled Had started Wellbutrin 150 mg QD for MDD and smoking cessation,  but did not continue as it was ineffective Denies BH therapy referral Does not have family or friends in the community, but does not want help No SI or HI

## 2024-07-08 NOTE — Assessment & Plan Note (Signed)
 Rash appears to be psoriasiform, concerning due to h/o RA Rash had slightly improved with clobetasol  cream initially, but is spreading in b/l LE and back - continue Clobetasol  for now Added Calcipotriene ointment for Psoriasis Referred to dermatology again

## 2024-07-08 NOTE — Patient Instructions (Signed)
 Please apply Clobetasol  cream regularly for rash.  Please start applying Calcipotriene ointment over the rash as prescribed.  Please continue to take other medications as prescribed.  Please continue to follow low carb diet and perform moderate exercise/walking at least 150 mins/week.

## 2024-07-08 NOTE — Assessment & Plan Note (Deleted)
 Rash appears to be psoriasiform, concerning due to h/o RA Rash slightly improved with clobetasol  cream, continue for now Referred to dermatology, but he did not see them

## 2024-07-11 ENCOUNTER — Other Ambulatory Visit (HOSPITAL_COMMUNITY): Payer: Self-pay

## 2024-07-12 LAB — BAYER DCA HB A1C WAIVED: HB A1C (BAYER DCA - WAIVED): 7.5 % — ABNORMAL HIGH (ref 4.8–5.6)

## 2024-07-13 ENCOUNTER — Other Ambulatory Visit (HOSPITAL_COMMUNITY): Payer: Self-pay

## 2024-07-18 NOTE — Progress Notes (Signed)
 Pharmacy Quality Measure Review  This patient is appearing on a report for being at risk of failing the adherence measure for hypertension (ACEi/ARB) medications this calendar year.   Medication: Lisinopril -hydrochlorothiazide  10-12.5 mg Last fill date: 07/08/24 for 90 day supply, sold on 07/08/24  Insurance report was not up to date. No action needed at this time.   Jenkins Graces, PharmD PGY1 Pharmacy Resident

## 2024-07-19 ENCOUNTER — Encounter: Payer: Self-pay | Admitting: Dermatology

## 2024-07-19 ENCOUNTER — Ambulatory Visit (INDEPENDENT_AMBULATORY_CARE_PROVIDER_SITE_OTHER): Admitting: Dermatology

## 2024-07-19 DIAGNOSIS — L308 Other specified dermatitis: Secondary | ICD-10-CM

## 2024-07-19 DIAGNOSIS — R21 Rash and other nonspecific skin eruption: Secondary | ICD-10-CM | POA: Diagnosis not present

## 2024-07-19 DIAGNOSIS — M0630 Rheumatoid nodule, unspecified site: Secondary | ICD-10-CM | POA: Insufficient documentation

## 2024-07-19 NOTE — Patient Instructions (Addendum)

## 2024-07-19 NOTE — Progress Notes (Signed)
 "  New Patient Visit   Subjective  Darrell Terry is a 58 y.o. male who presents for the following: Psoriasis   Patient states he has concerns of having psoriasis, has not been diagnosed. He states he has break outs on legs, stomach and back. He has been using calcipotriene  and was using for about 4 months and clobetasol  cream has been using for a few days that was prescribed by PCP and states no effect. Patient states he has arthritis and was using marijuana to help treat, and states the flare appeared when he stopped using marijuana.    The following portions of the chart were reviewed this encounter and updated as appropriate: medications, allergies, medical history  Review of Systems:  No other skin or systemic complaints except as noted in HPI or Assessment and Plan.  Objective  Well appearing patient in no apparent distress; mood and affect are within normal limits.  A focused examination was performed of the following areas: Stomach, back, bilateral lower legs, left arm,   Relevant exam findings are noted in the Assessment and Plan.  Right Lower Back Errythematous scaly plaque  Assessment & Plan   PSORIASIS vs ECZEMA  Exam: numerous Well-demarcated circular erythematous papules/plaques with silvery scale, guttate pink scaly papules. Trunk extremities 10-15% BSA.  Chronic and persistent condition with duration or expected duration over one year. Condition is bothersome/symptomatic for patient. Currently flared.   Psoriasis is a chronic non-curable, but treatable genetic/hereditary disease that may have other systemic features affecting other organ systems such as joints (Psoriatic Arthritis). It is associated with an increased risk of inflammatory bowel disease, heart disease, non-alcoholic fatty liver disease, and depression.  Treatments include light and laser treatments; topical medications; and systemic medications including oral and injectables.  Treatment Plan: Continue  clobetasol  0.05% cream twice a day as needed to affected areas for up to 2 weeks. Avoid applying to face, groin, and axilla. Use as directed. Long-term use can cause thinning of the skin.  Topical steroids (such as triamcinolone, fluocinolone, fluocinonide, mometasone, clobetasol , halobetasol, betamethasone, hydrocortisone) can cause thinning and lightening of the skin if they are used for too long in the same area. Your physician has selected the right strength medicine for your problem and area affected on the body. Please use your medication only as directed by your physician to prevent side effects.    -Patient would like to move forward with injectable medications when results of biopsy return. -Patient was on Humera previously with rheumatology.   RASH AND OTHER NONSPECIFIC SKIN ERUPTION Right Lower Back - Skin / nail biopsy - Right Lower Back Type of biopsy: tangential   Informed consent: discussed and consent obtained   Timeout: patient name, date of birth, surgical site, and procedure verified   Procedure prep:  Patient was prepped and draped in usual sterile fashion Prep type:  Isopropyl alcohol  Anesthesia: the lesion was anesthetized in a standard fashion   Anesthetic:  1% lidocaine  w/ epinephrine 1-100,000 buffered w/ 8.4% NaHCO3 Instrument used: DermaBlade   Hemostasis achieved with: pressure and aluminum chloride   Outcome: patient tolerated procedure well   Post-procedure details: sterile dressing applied and wound care instructions given   Dressing type: bandage and petrolatum    Specimen 1 - Surgical pathology Differential Diagnosis: eczema vs psoriasis   Check Margins: No Errythematus scaly plaque  No follow-ups on file.  IAlmetta Nora, RMA, am acting as scribe for Boneta Sharps, MD .   Documentation: I have reviewed the  above documentation for accuracy and completeness, and I agree with the above.  Boneta Sharps, MD    "

## 2024-07-25 LAB — SURGICAL PATHOLOGY

## 2024-07-26 ENCOUNTER — Ambulatory Visit: Payer: Self-pay | Admitting: Dermatology

## 2024-07-26 DIAGNOSIS — L209 Atopic dermatitis, unspecified: Secondary | ICD-10-CM

## 2024-07-26 NOTE — Telephone Encounter (Signed)
-----   Message from Boneta Sharps, MD sent at 07/26/2024 12:19 PM EST ----- Diagnosis: right lower back :       PSORIASIFORM SPONGIOTIC DERMATITIS, SEE DESCRIPTION   Plan: please call to share biopsy favors chronic eczema over psoriasis. Please send tacrolimus ointment for BID use in anticipation of starting Dupixent. Do not pick up if too expensive. Please make  1 month follow up. Thank you

## 2024-07-26 NOTE — Telephone Encounter (Signed)
 Left message for patient to return call to discuss biopsy results.

## 2024-08-01 MED ORDER — TACROLIMUS 0.1 % EX OINT: TOPICAL_OINTMENT | CUTANEOUS | 1 refills | Status: AC

## 2024-08-01 NOTE — Telephone Encounter (Addendum)
 Called patient and discussed bx results and treatment. Scheduled patient for 1 month follow up with Dr. Claudene.  Rx sent to patient pharmacy. Patient instructed to call if unable to get rx.     ----- Message from Boneta Claudene, MD sent at 07/26/2024 12:19 PM EST ----- Diagnosis: right lower back :       PSORIASIFORM SPONGIOTIC DERMATITIS, SEE DESCRIPTION   Plan: please call to share biopsy favors chronic eczema over psoriasis. Please send tacrolimus  ointment for BID use in anticipation of starting Dupixent. Do not pick up if too expensive. Please make  1 month follow up. Thank you

## 2024-08-01 NOTE — Addendum Note (Signed)
 Addended by: TANDA SETTER A on: 08/01/2024 02:01 PM   Modules accepted: Orders

## 2024-08-05 ENCOUNTER — Other Ambulatory Visit: Payer: Self-pay

## 2024-08-05 DIAGNOSIS — Z122 Encounter for screening for malignant neoplasm of respiratory organs: Secondary | ICD-10-CM

## 2024-08-05 DIAGNOSIS — Z87891 Personal history of nicotine dependence: Secondary | ICD-10-CM

## 2024-08-05 DIAGNOSIS — F17210 Nicotine dependence, cigarettes, uncomplicated: Secondary | ICD-10-CM

## 2024-09-01 ENCOUNTER — Encounter: Payer: Self-pay | Admitting: Dermatology

## 2024-09-01 ENCOUNTER — Ambulatory Visit: Admitting: Dermatology

## 2024-09-01 DIAGNOSIS — L209 Atopic dermatitis, unspecified: Secondary | ICD-10-CM

## 2024-09-01 DIAGNOSIS — Z79899 Other long term (current) drug therapy: Secondary | ICD-10-CM

## 2024-09-01 DIAGNOSIS — Z7189 Other specified counseling: Secondary | ICD-10-CM

## 2024-09-01 MED ORDER — DUPILUMAB 300 MG/2ML ~~LOC~~ SOSY
600.0000 mg | PREFILLED_SYRINGE | Freq: Once | SUBCUTANEOUS | Status: AC
  Administered 2024-09-01: 600 mg via SUBCUTANEOUS

## 2024-09-01 MED ORDER — DUPIXENT 300 MG/2ML ~~LOC~~ SOAJ: 300.0000 mg | SUBCUTANEOUS | 5 refills | Status: AC

## 2024-09-01 NOTE — Patient Instructions (Signed)
 Dupilumab (Dupixent) is a biologic treatment given by injection for adults and children with moderate-to-severe atopic dermatitis. Goal is control of skin condition, not cure. It is given as 2 injections at the first dose followed by 1 injection every 2 weeks thereafter.  Young children are dosed monthly.  Potential side effects include allergic reaction, herpes infections, injection site reactions and conjunctivitis (inflammation of the eyes).  The use of Dupixent requires long term medication management, including periodic office visits.    Gentle Skin Care Guide  1. Bathe no more than once a day.  2. Avoid bathing in hot water  3. Use a mild soap like Dove, Vanicream, Cetaphil, CeraVe. Can use Lever 2000 or Cetaphil antibacterial soap  4. Use soap only where you need it. On most days, use it under your arms, between your legs, and on your feet. Let the water rinse other areas unless visibly dirty.  5. When you get out of the bath/shower, use a towel to gently blot your skin dry, don't rub it.  6. While your skin is still a little damp, apply a moisturizing cream such as Vanicream, CeraVe, Cetaphil, Eucerin, Sarna lotion or plain Vaseline Jelly. For hands apply Neutrogena [place] Hand Cream or Excipial Hand Cream.  7. Reapply moisturizer any time you start to itch or feel dry.  8. Sometimes using free and clear laundry detergents can be helpful. Fabric softener sheets should be avoided. Downy Free & Gentle liquid, or any liquid fabric softener that is free of dyes and perfumes, it acceptable to use  9. If your doctor has given you prescription creams you may apply moisturizers over them       Due to recent changes in healthcare laws, you may see results of your pathology and/or laboratory studies on MyChart before the doctors have had a chance to review them. We understand that in some cases there may be results that are confusing or concerning to you. Please understand that not all  results are received at the same time and often the doctors may need to interpret multiple results in order to provide you with the best plan of care or course of treatment. Therefore, we ask that you please give us  2 business days to thoroughly review all your results before contacting the office for clarification. Should we see a critical lab result, you will be contacted sooner.   If You Need Anything After Your Visit  If you have any questions or concerns for your doctor, please call our main line at [phone] and press option 4 to reach your doctor's medical assistant. If no one answers, please leave a voicemail as directed and we will return your call as soon as possible. Messages left after 4 pm will be answered the following business day.   You may also send us  a message via MyChart. We typically respond to MyChart messages within 1-2 business days.  For prescription refills, please ask your pharmacy to contact our office. Our fax number is [phone].  If you have an urgent issue when the clinic is closed that cannot wait until the next business day, you can page your doctor at the number below.    Please note that while we do our best to be available for urgent issues outside of office hours, we are not available 24/7.   If you have an urgent issue and are unable to reach us , you may choose to seek medical care at your doctor's office, retail clinic, urgent care center, or  emergency room.  If you have a medical emergency, please immediately call 911 or go to the emergency department.  Pager Numbers  - Dr. Hester: [phone]  - Dr. Jackquline: [phone]  - Dr. Claudene: [phone]   - Dr. Raymund: [phone]  In the event of inclement weather, please call our main line at [phone] for an update on the status of any delays or closures.  Dermatology Medication Tips: Please keep the boxes that topical medications come in in order to help keep track of the  instructions about where and how to use these. Pharmacies typically print the medication instructions only on the boxes and not directly on the medication tubes.   If your medication is too expensive, please contact our office at [phone] option 4 or send us  a message through MyChart.   We are unable to tell what your co-pay for medications will be in advance as this is different depending on your insurance coverage. However, we may be able to find a substitute medication at lower cost or fill out paperwork to get insurance to cover a needed medication.   If a prior authorization is required to get your medication covered by your insurance company, please allow us  1-2 business days to complete this process.  Drug prices often vary depending on where the prescription is filled and some pharmacies may offer cheaper prices.  The website www.goodrx.com contains coupons for medications through different pharmacies. The prices here do not account for what the cost may be with help from insurance (it may be cheaper with your insurance), but the website can give you the price if you did not use any insurance.  - You can print the associated coupon and take it with your prescription to the pharmacy.  - You may also stop by our office during regular business hours and pick up a GoodRx coupon card.  - If you need your prescription sent electronically to a different pharmacy, notify our office through [facility] or by phone at [phone] option 4.     Si Usted Necesita Algo Despus de Su Visita  Tambin puede enviarnos un mensaje a travs de Clinical cytogeneticist. Por lo general respondemos a los mensajes de MyChart en el transcurso de 1 a 2 das hbiles.  Para renovar recetas, por favor pida a su farmacia que se ponga en contacto con nuestra oficina. Randi lakes de fax es [place] [phone].  Si tiene un asunto urgente cuando la clnica est cerrada y que no puede esperar hasta el siguiente da hbil,  puede llamar/localizar a su doctor(a) al nmero que aparece a continuacin.   Por favor, tenga en cuenta que aunque hacemos todo lo posible para estar disponibles para asuntos urgentes fuera del horario de [place], no estamos disponibles las 24 horas del da, los 7 [address]  [address] de la [place].   Si tiene un problema urgente y no puede comunicarse con nosotros, puede optar por buscar atencin mdica  en el consultorio de su doctor(a), en una clnica privada, en un centro de atencin urgente o en una sala de emergencias.  Si tiene Engineer, drilling, por favor llame inmediatamente al 911 o vaya a la sala de emergencias.  Nmeros de bper  - Dr. Hester: [phone]  - Dra. Jackquline: [phone]  - Dr. Claudene: [phone]  - Dra. Kitts: [phone]  En caso de inclemencias del [place], por favor llame a nuestra lnea principal al [phone] para una actualizacin sobre el estado de cualquier retraso o cierre.  Consejos para la medicacin  en dermatologa: Por favor, guarde las cajas en las que vienen los medicamentos de uso tpico para ayudarle a seguir las instrucciones sobre dnde y cmo usarlos. Las farmacias generalmente imprimen las instrucciones del medicamento slo en las cajas y no directamente en los tubos del [place].   Si su medicamento es muy caro, por favor, pngase en contacto con landry rieger llamando al [phone] y presione la opcin 4 o envenos un mensaje a travs de Clinical cytogeneticist.   No podemos decirle cul ser su copago por los medicamentos por adelantado ya que esto es diferente dependiendo de la cobertura de su seguro. Sin embargo, es posible que podamos encontrar un medicamento sustituto a Audiological scientist un formulario para que el seguro cubra el medicamento que se considera necesario.   Si se requiere una autorizacin previa para que su compaa de seguros malta su medicamento, por favor permtanos de 1 a 2 das hbiles para completar este proceso.  Los precios  de los medicamentos varan con frecuencia dependiendo del Environmental consultant de dnde se surte la receta y alguna farmacias pueden ofrecer precios ms baratos.  El sitio web www.goodrx.com tiene cupones para medicamentos de Health and safety inspector. Los precios aqu no tienen en cuenta lo que podra costar con la ayuda del seguro (puede ser ms barato con su seguro), pero el sitio web puede darle el precio si no utiliz Tourist information centre manager.  - Puede imprimir el cupn correspondiente y llevarlo con su receta a la farmacia.  - Tambin puede pasar por nuestra oficina durante el horario de atencin regular y Education officer, museum una tarjeta de cupones de GoodRx.  - Si necesita que su receta se enve electrnicamente a una farmacia diferente, informe a nuestra oficina a travs de MyChart de [place] o por telfono llamando al [phone] y presione la opcin 4.

## 2024-09-01 NOTE — Progress Notes (Signed)
" ° °  Follow-Up Visit   Subjective  Darrell Terry is a 59 y.o. male who presents for the following: Atopic Dermatitis. 1 month follow up. Using Tacrolimus  ointment as directed. Has stopped Clobetasol . States he has not noticed much of a change.   This patient is accompanied in the office by his friend.   The following portions of the chart were reviewed this encounter and updated as appropriate: medications, allergies, medical history  Review of Systems:  No other skin or systemic complaints except as noted in HPI or Assessment and Plan.  Objective  Well appearing patient in no apparent distress; mood and affect are within normal limits.  Areas Examined: Arms, torso, legs, feet  Relevant physical exam findings are noted in the Assessment and Plan.              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 Condition is symptomatic/ bothersome to patient. Not currently at goal.   Atopic dermatitis (eczema) is a chronic, relapsing, pruritic condition that can significantly affect quality of life. It is often associated with allergic rhinitis and/or asthma and can require treatment with topical medications, phototherapy, or in severe cases biologic injectable medication (Dupixent ; Adbry) or Oral JAK inhibitors.  Treatment Plan: Biopsy favours chronic eczema. If Dupixent  doesn't work then could be psoriasis  instead. Would change treatment. Some improvement expected in first month but needs 4 months for full effect  Dupixent  loading dose given today.  Dupixent  300 mg/2 ml x2 given in B/L upper arms. Patient tolerated well. Patient's friend injected 1 syringe today.  Two additional sample boxes given for patient to self administer at home. Continue 300 mg every 14 days Lot: [id number] Exp: 12/2025  Dupilumab  (Dupixent ) is a biologic treatment given by injection for adults and children with moderate-to-severe atopic dermatitis. Goal is control of skin condition, not cure. It is given as 2 injections at the first dose followed by 1 injection every 2 weeks thereafter.  Young children are dosed monthly.  Potential side effects include allergic reaction, herpes infections, injection site reactions and conjunctivitis (inflammation of the eyes).  The use of Dupixent  requires long term medication management, including periodic office visits.   Recommend gentle skin care.     Return in about 2 months (around 10/30/2024) for Atopic Dermatitis Follow Up, Dupixent  Follow Up.  I, Darrell Terry, CMA, am acting as scribe for Darrell Sharps, MD.   Documentation: I have reviewed the above documentation for accuracy and completeness, and I agree with the above.  Darrell Sharps, MD   "

## 2024-10-11 ENCOUNTER — Ambulatory Visit: Payer: Self-pay | Admitting: Internal Medicine

## 2024-11-01 ENCOUNTER — Ambulatory Visit: Admitting: Dermatology

## 2024-11-04 ENCOUNTER — Ambulatory Visit: Payer: Self-pay | Admitting: Internal Medicine
